# Patient Record
Sex: Male | Born: 1965
Health system: Southern US, Community
[De-identification: ages and names within clinical notes are randomized; demographics above are authoritative.]

## PROBLEM LIST (undated history)

## (undated) DIAGNOSIS — E119 Type 2 diabetes mellitus without complications: Secondary | ICD-10-CM

## (undated) DIAGNOSIS — E079 Disorder of thyroid, unspecified: Secondary | ICD-10-CM

## (undated) DIAGNOSIS — K219 Gastro-esophageal reflux disease without esophagitis: Secondary | ICD-10-CM

## (undated) DIAGNOSIS — E039 Hypothyroidism, unspecified: Secondary | ICD-10-CM

## (undated) DIAGNOSIS — I10 Essential (primary) hypertension: Secondary | ICD-10-CM

## (undated) DIAGNOSIS — R0600 Dyspnea, unspecified: Secondary | ICD-10-CM

## (undated) DIAGNOSIS — G4730 Sleep apnea, unspecified: Secondary | ICD-10-CM

## (undated) HISTORY — DX: Disorder of thyroid, unspecified: E07.9

## (undated) HISTORY — DX: Essential (primary) hypertension: I10

## (undated) HISTORY — DX: Type 2 diabetes mellitus without complications: E11.9

---

## 2009-05-20 ENCOUNTER — Emergency Department (HOSPITAL_COMMUNITY): Admission: EM | Admit: 2009-05-20 | Discharge: 2009-05-20 | Payer: Self-pay | Admitting: Family Medicine

## 2009-08-22 ENCOUNTER — Emergency Department: Payer: Self-pay | Admitting: Internal Medicine

## 2009-08-31 ENCOUNTER — Emergency Department (HOSPITAL_COMMUNITY): Admission: EM | Admit: 2009-08-31 | Discharge: 2009-08-31 | Payer: Self-pay | Admitting: Emergency Medicine

## 2010-12-31 LAB — POCT URINALYSIS DIP (DEVICE)
Bilirubin Urine: NEGATIVE
Glucose, UA: 500 mg/dL — AB
Nitrite: NEGATIVE
Urobilinogen, UA: 1 mg/dL (ref 0.0–1.0)
pH: 7 (ref 5.0–8.0)

## 2011-01-01 IMAGING — CR DG CHEST 2V
1 series · 3 of 3 positions shown · non-contrast
Comparison: none

REASON FOR EXAM: chest pain
COMMENTS:

PROCEDURE:     DXR - DXR CHEST PA (OR AP) AND LATERAL  - August 22, 2009 [DATE]
RESULT:     The lung fields are clear. No pneumonia, pneumothorax or pleural
effusion is seen. Heart size is normal.

[Series 1: view not recorded · 0.17mm/px · 3 of 3 slices shown]
[im 1/3]
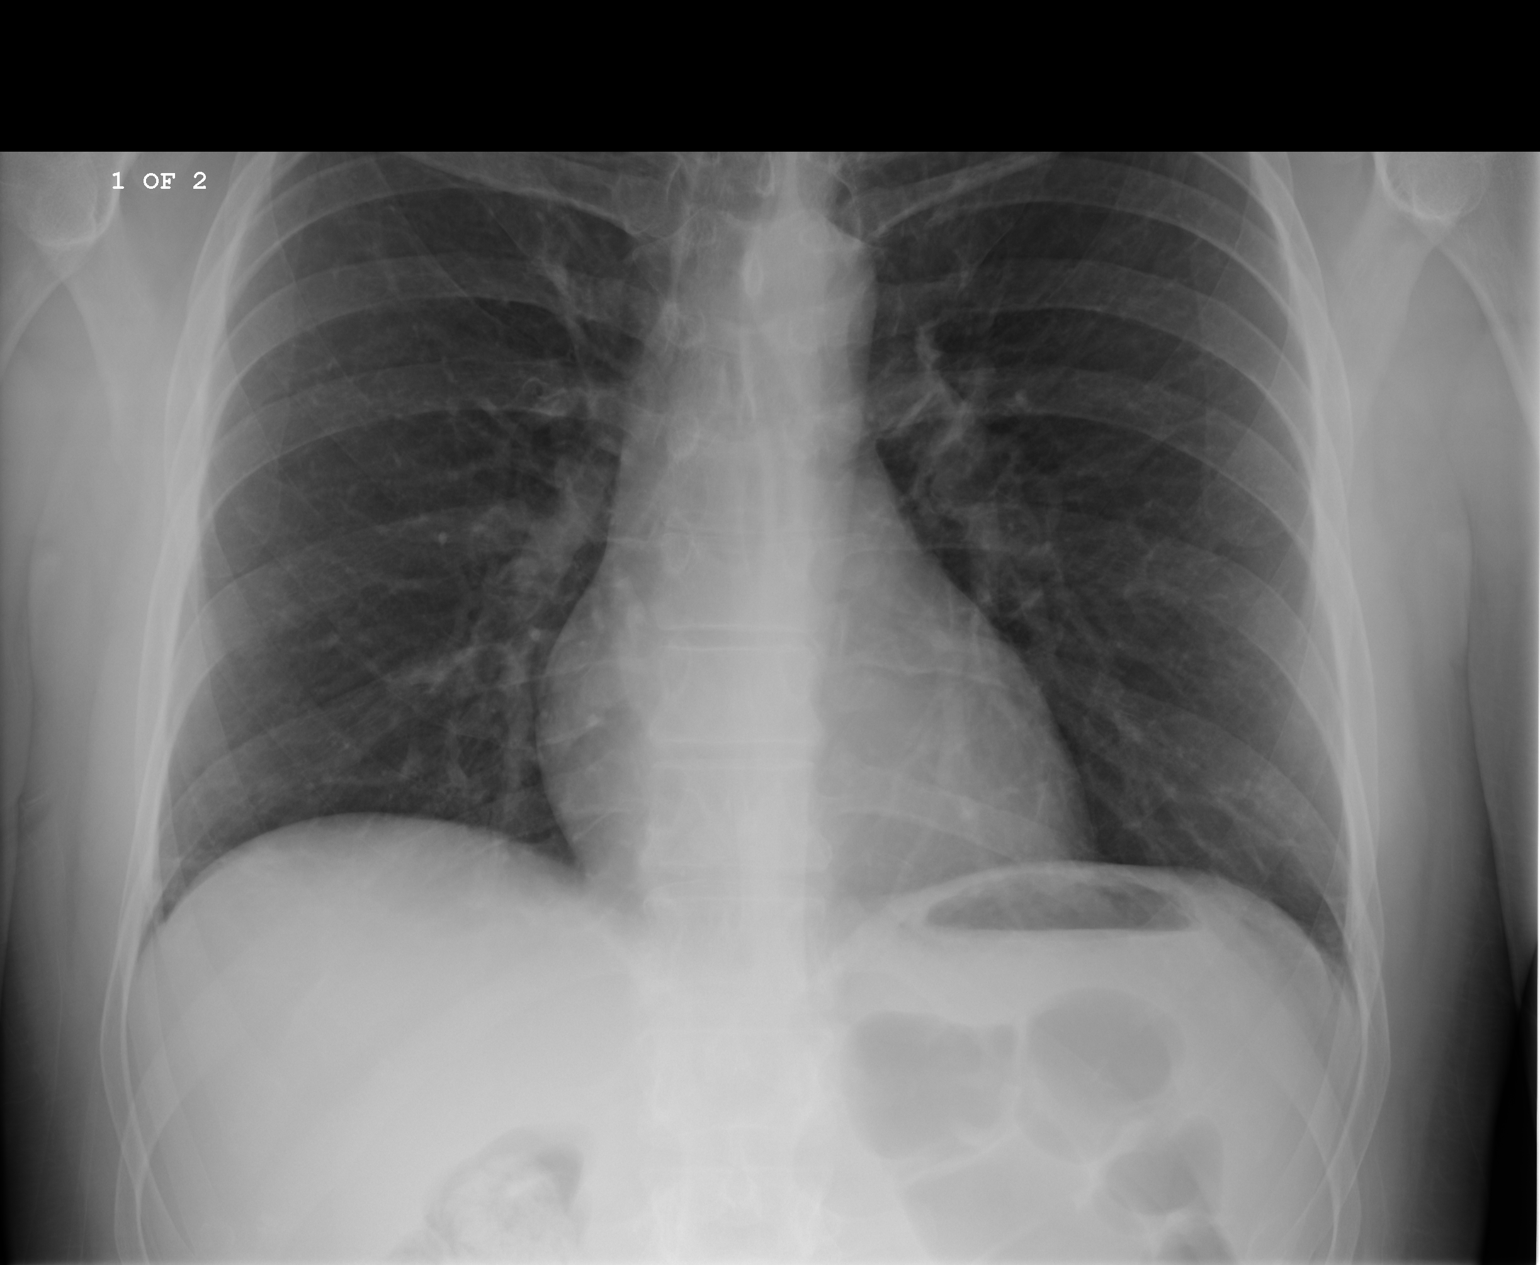
[im 2/3]
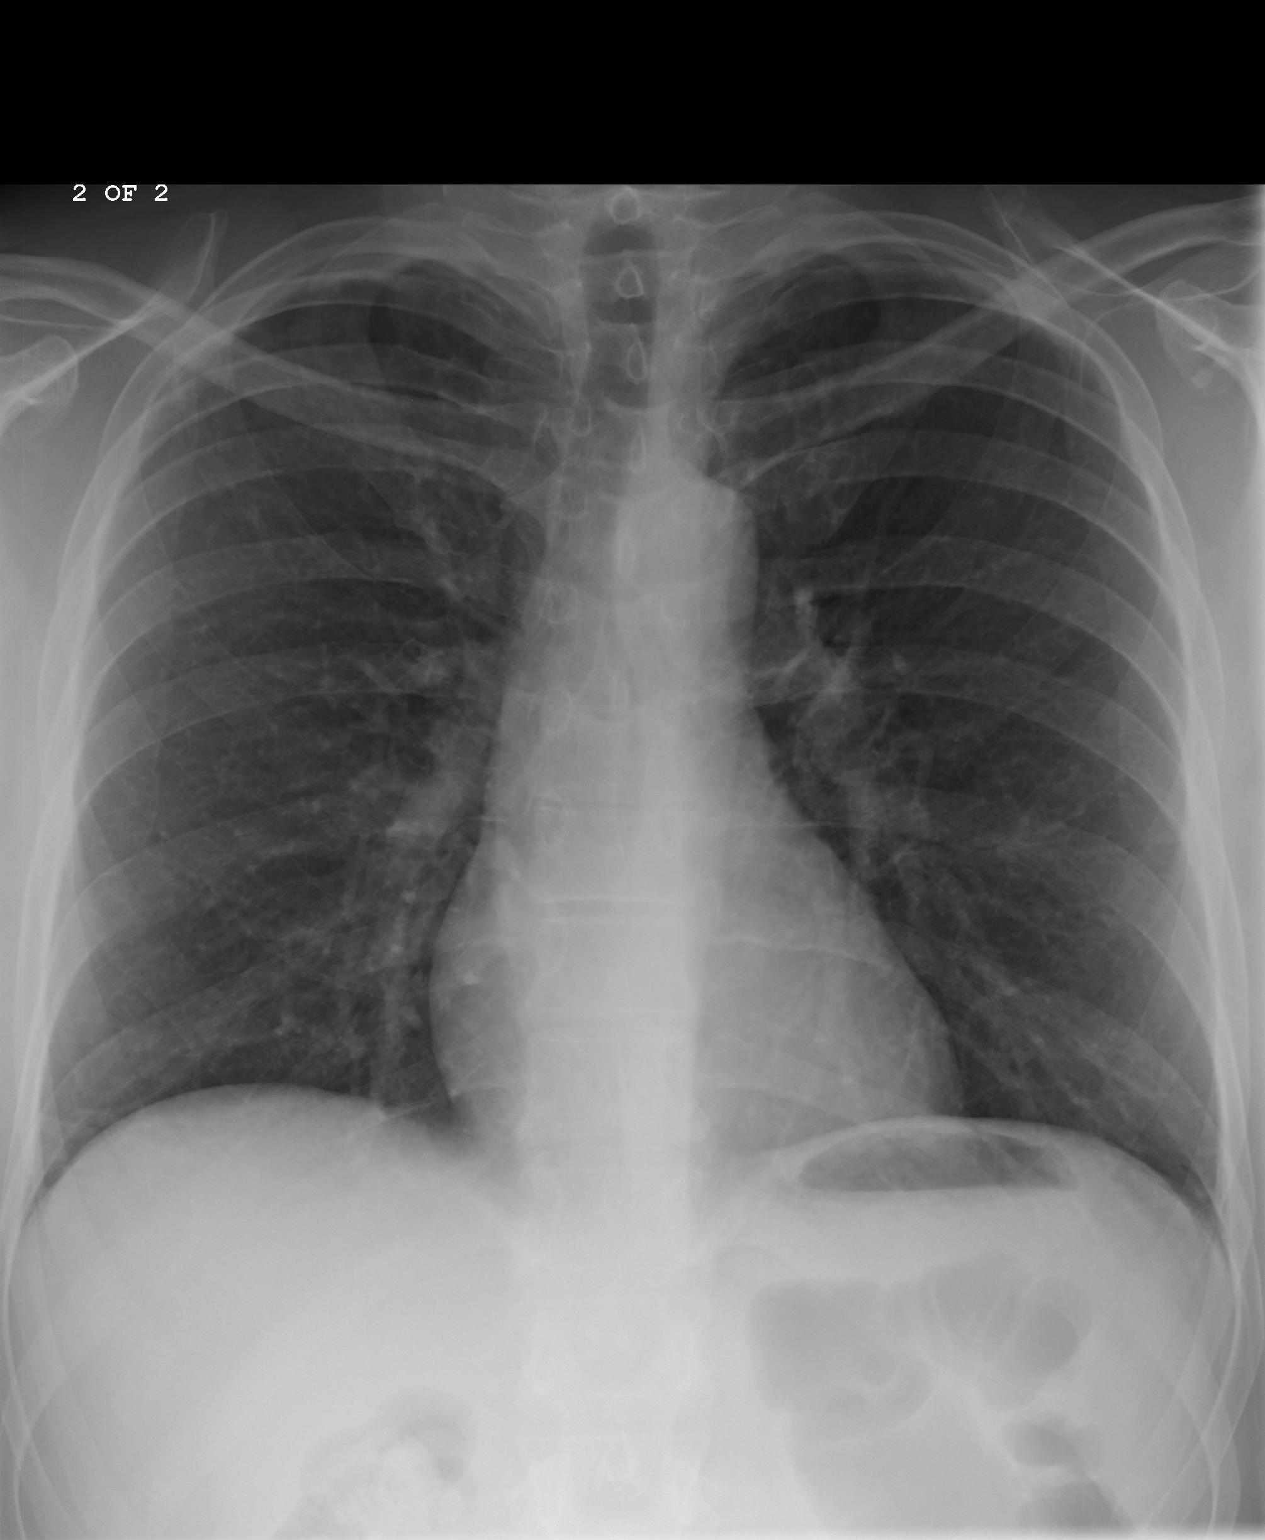
[im 3/3]
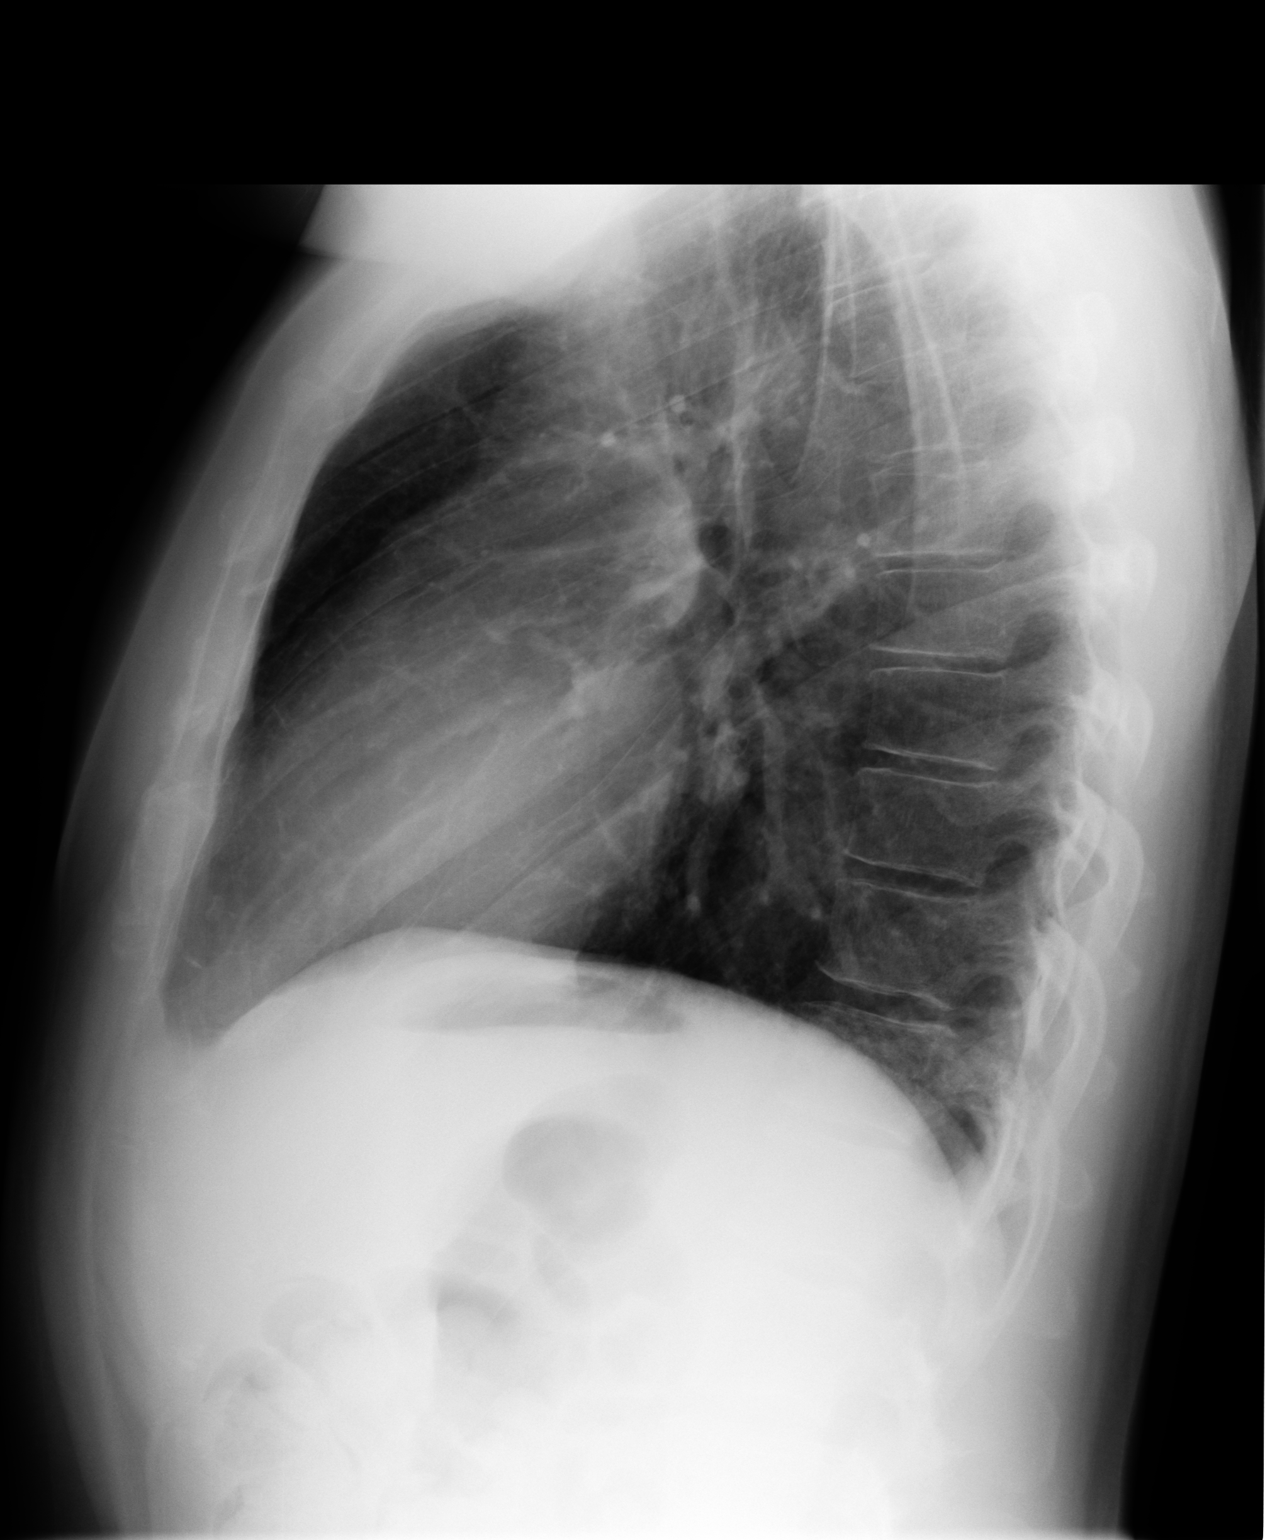

[3 of 3 positions shown; findings below may reference images not displayed]

IMPRESSION: 1.     No acute changes are identified.

## 2011-01-01 IMAGING — CT CT STONE STUDY
1 of 2 series · 15 of 32 positions shown, 19 images · non-contrast
Comparison: none

REASON FOR EXAM: l flank pain
COMMENTS:

[Series 2: stone · axial · 0.69mm/px · z∈[-1216,-772]mm · 15 of 166 slices shown, 19 images]
[im 12/166  soft-tissue]
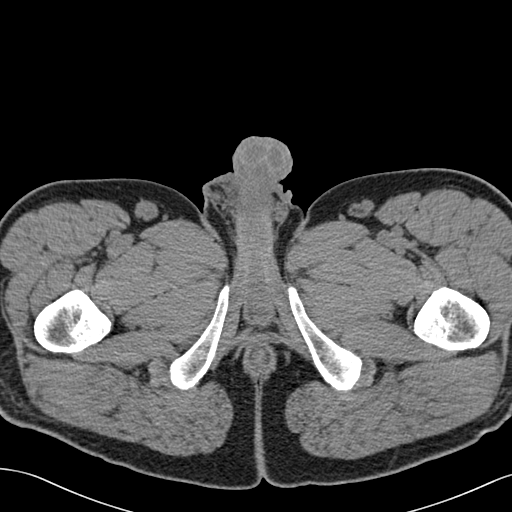
[im 12/166  bone]
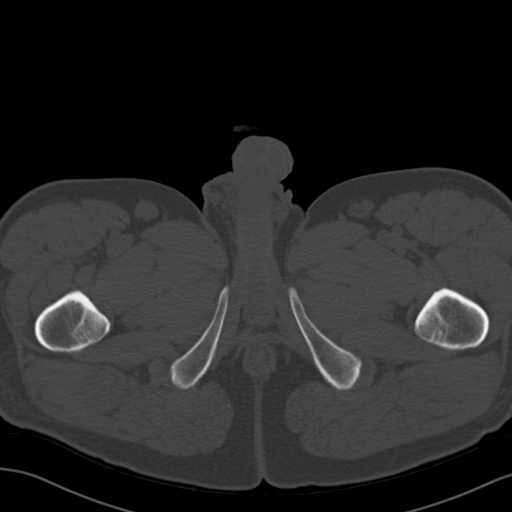
[im 24/166  soft-tissue]
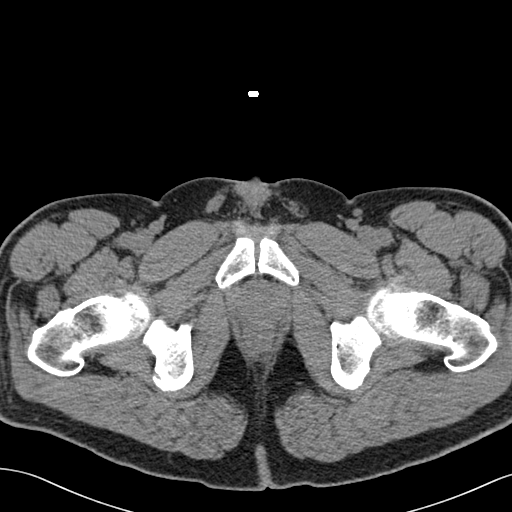
[im 36/166  soft-tissue]
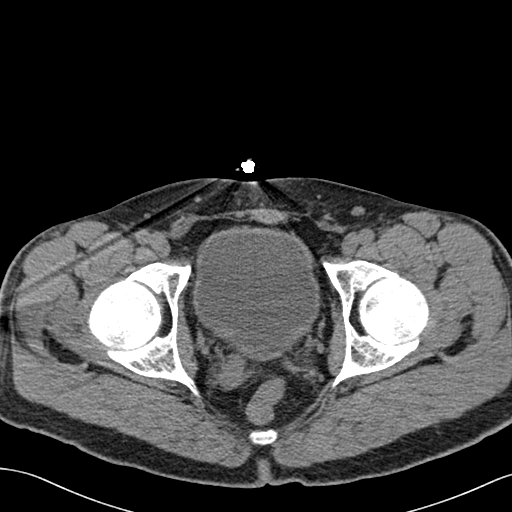
[im 48/166  soft-tissue]
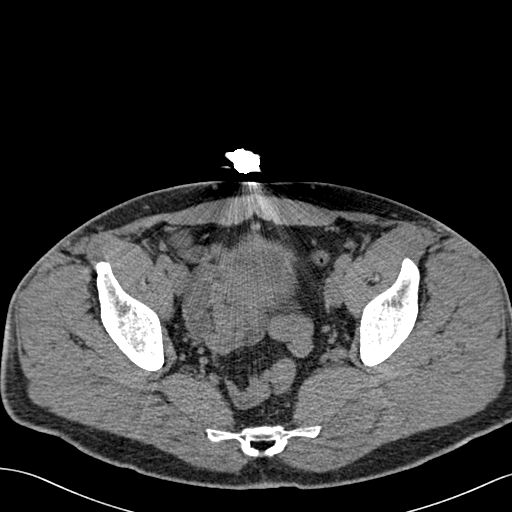
[im 59/166  soft-tissue]
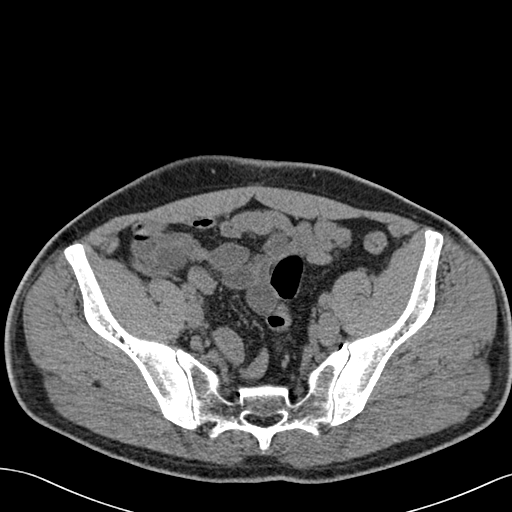
[im 71/166  soft-tissue]
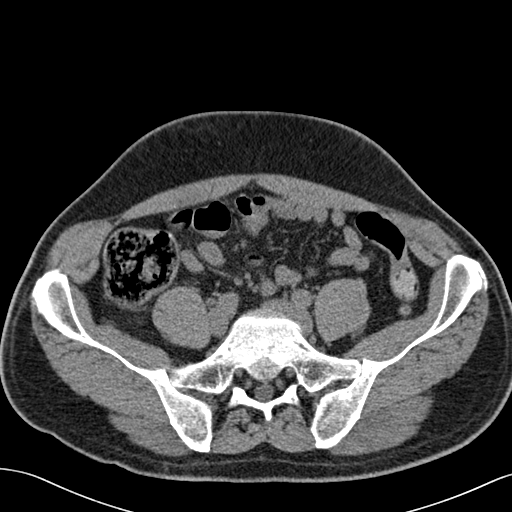
[im 83/166  soft-tissue]
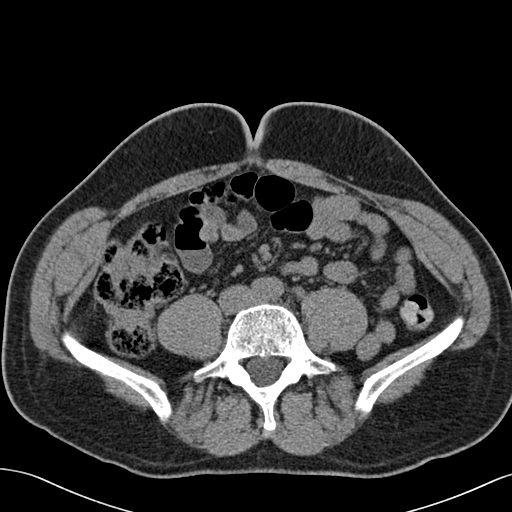
[im 95/166  soft-tissue]
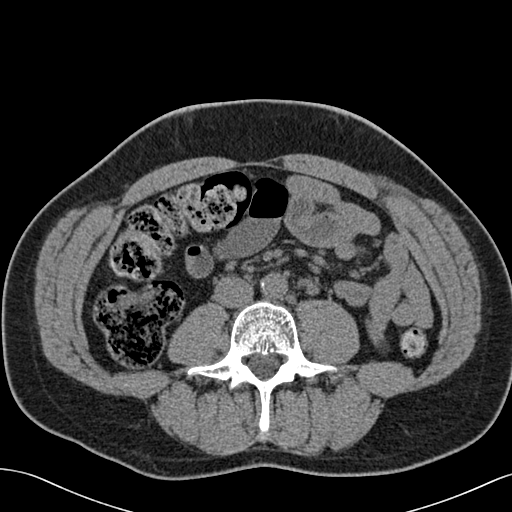
[im 107/166  soft-tissue]
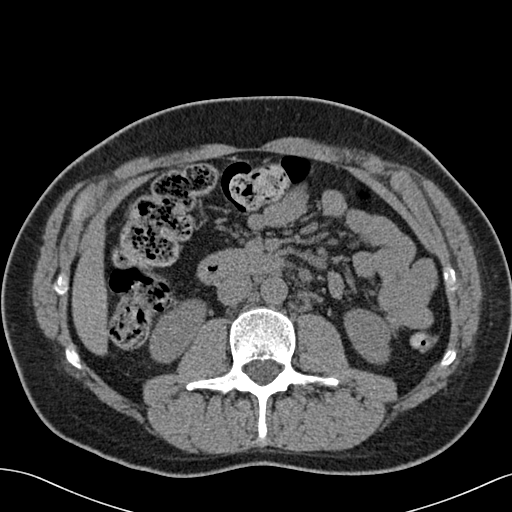
[im 107/166  bone]
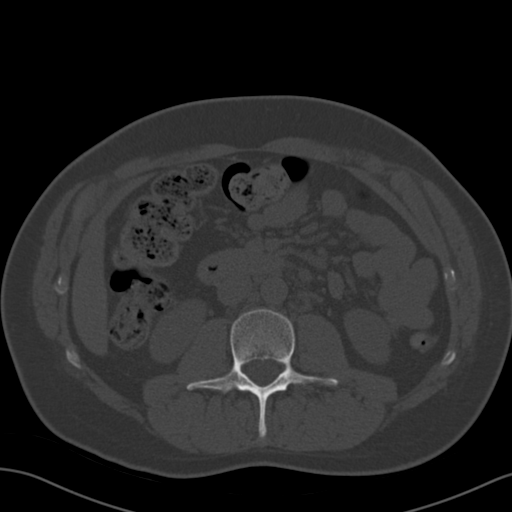
[im 118/166  soft-tissue]
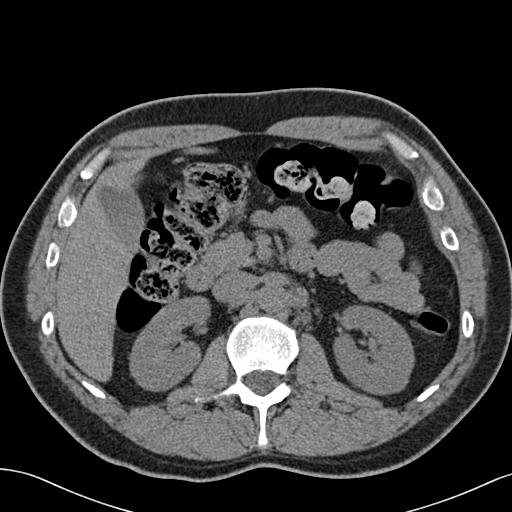
[im 130/166  soft-tissue]
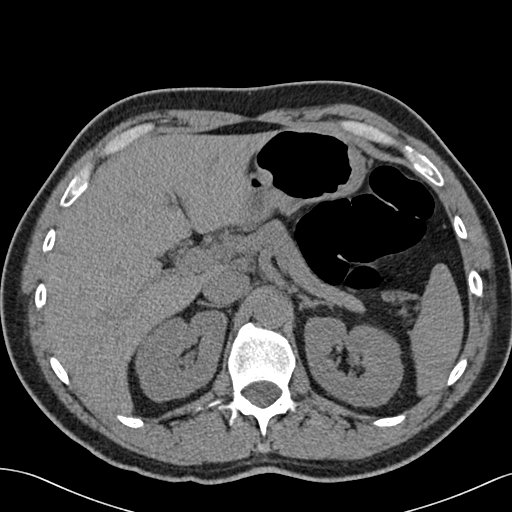
[im 142/166  soft-tissue]
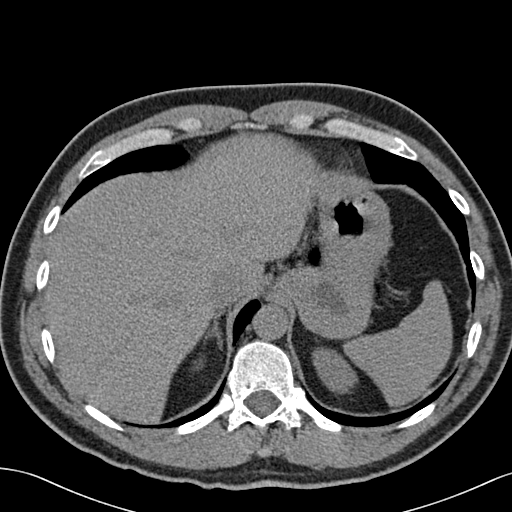
[im 142/166  lung]
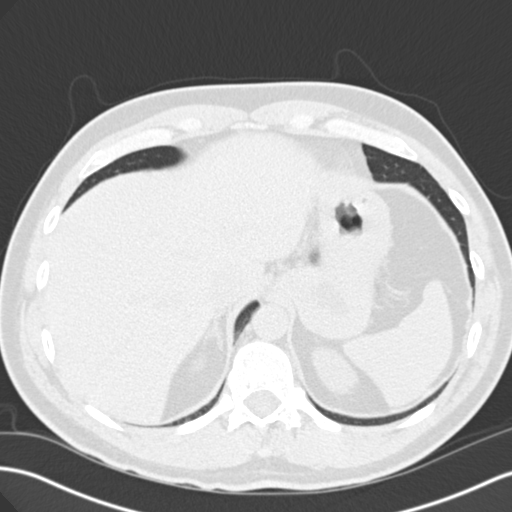
[im 148/166  lung]
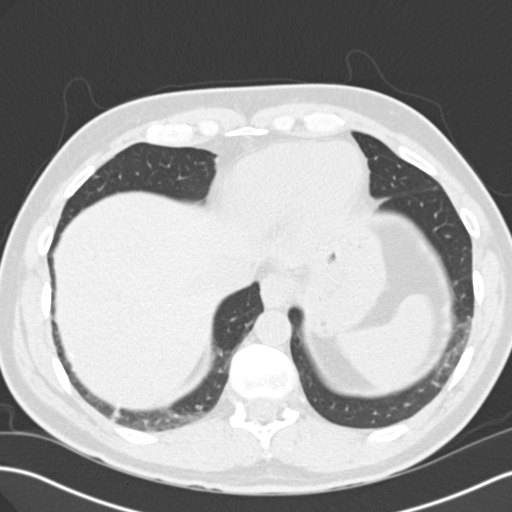
[im 154/166  soft-tissue]
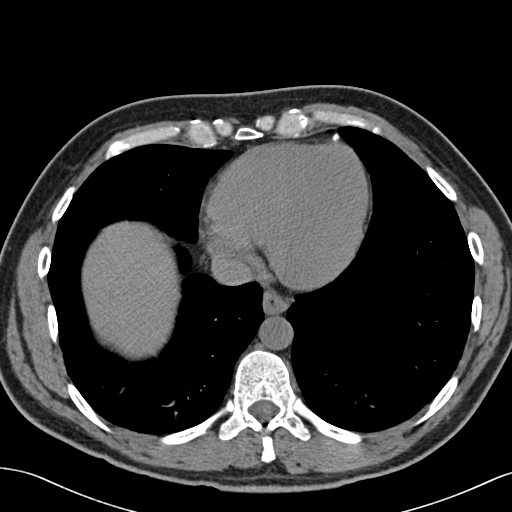
[im 154/166  lung]
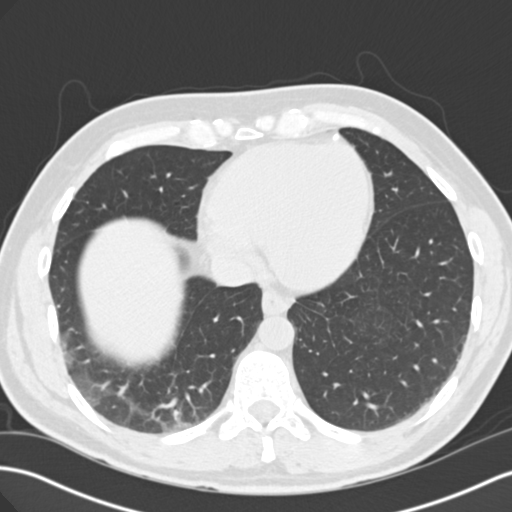
[im 160/166  lung]
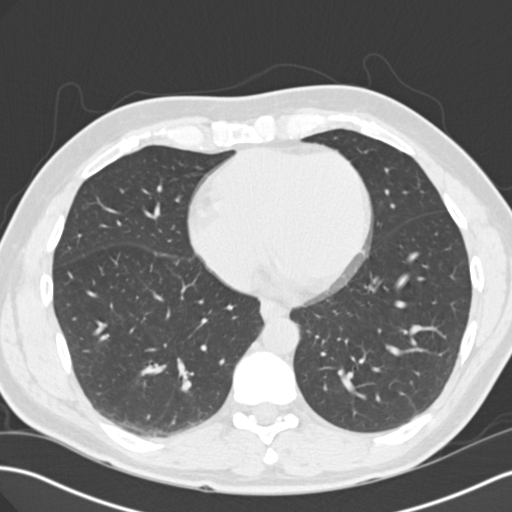

[15 of 32 positions shown; findings below may reference images not displayed]

PROCEDURE:     CT  - CT ABDOMEN /PELVIS WO (STONE)  - August 22, 2009  [DATE]

RESULT:     Noncontrast emergent renal stone protocol CT of the abdomen and
pelvis is reconstructed at 3 mm slice thickness in the axial plane. The
patient has no previous examination for comparison.

Images through the base of the lungs demonstrate minimal dependent
atelectasis there is a noncalcified pulmonary nodule in a subpleural region
laterally at lung window settings on image 6 measuring approximately 5.2 mm.
Followup of this is recommended. There appears to be some atelectasis or
fibrosis in the posterior gutter on the right.

Neither kidney demonstrates obstructive changes or evidence of a renal
stone. The renal cortex shows  a normal appearing contour with no abnormal
attenuation. The gallbladder shows no evidence of radiopaque gallstones. The
appendix is seen and contains a density which could represent
appendicoliths. Ingested medication such as antacid collecting in this
region are also consideration. There is a mild increased density in the
lumen of the descending colon on the left which could be from the same
etiology. The aorta is normal in caliber with some scattered atherosclerotic
calcification. The urinary bladder is unremarkable. The prostate and seminal
vesicles appear to be grossly unremarkable. There is no pneumatosis, free
air or free fluid. The pancreas, spleen and liver as well as the adrenal
glands are unremarkable.
IMPRESSION: Unremarkable CT of the abdomen and pelvis. No urinary
calculi or obstructive changes are evident.
Nodular density laterally in the left lower lobe without calcification. This
is smoothly marginated but should be followed in 6 months. A noncontrast CT
of the chest could be performed.

## 2011-01-10 IMAGING — CR DG RIBS W/ CHEST 3+V*L*
3 series · 3 of 3 positions shown · non-contrast
Comparison: None

CLINICAL DATA: Fall injury with left-sided inferior chest wall
pain, smoker.

LEFT RIBS AND CHEST - 3+ VIEW

[view not recorded (1 of 3)]
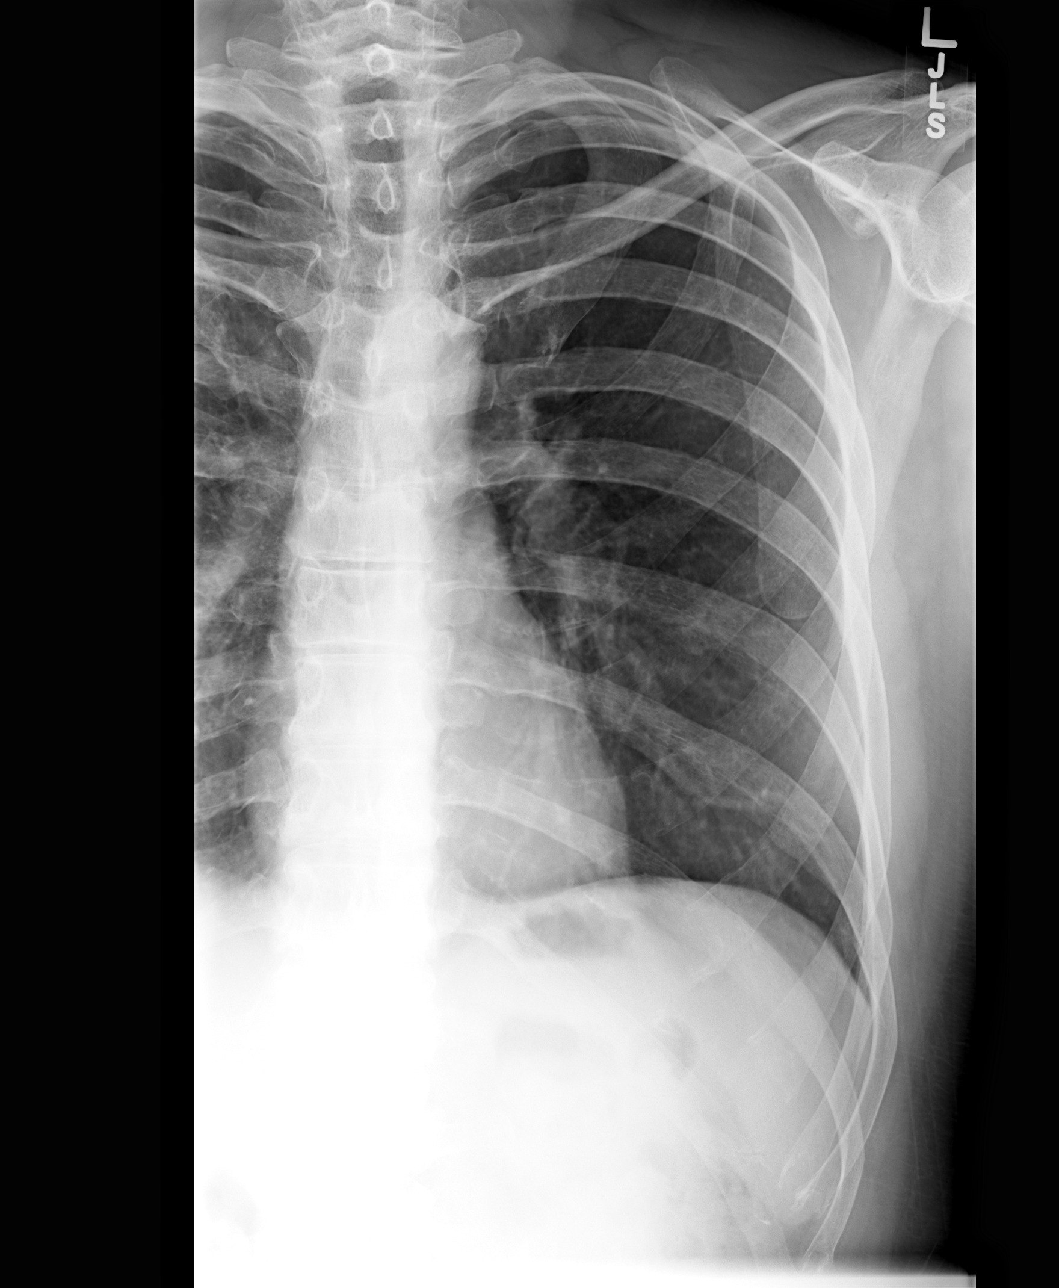

[view not recorded (2 of 3)]
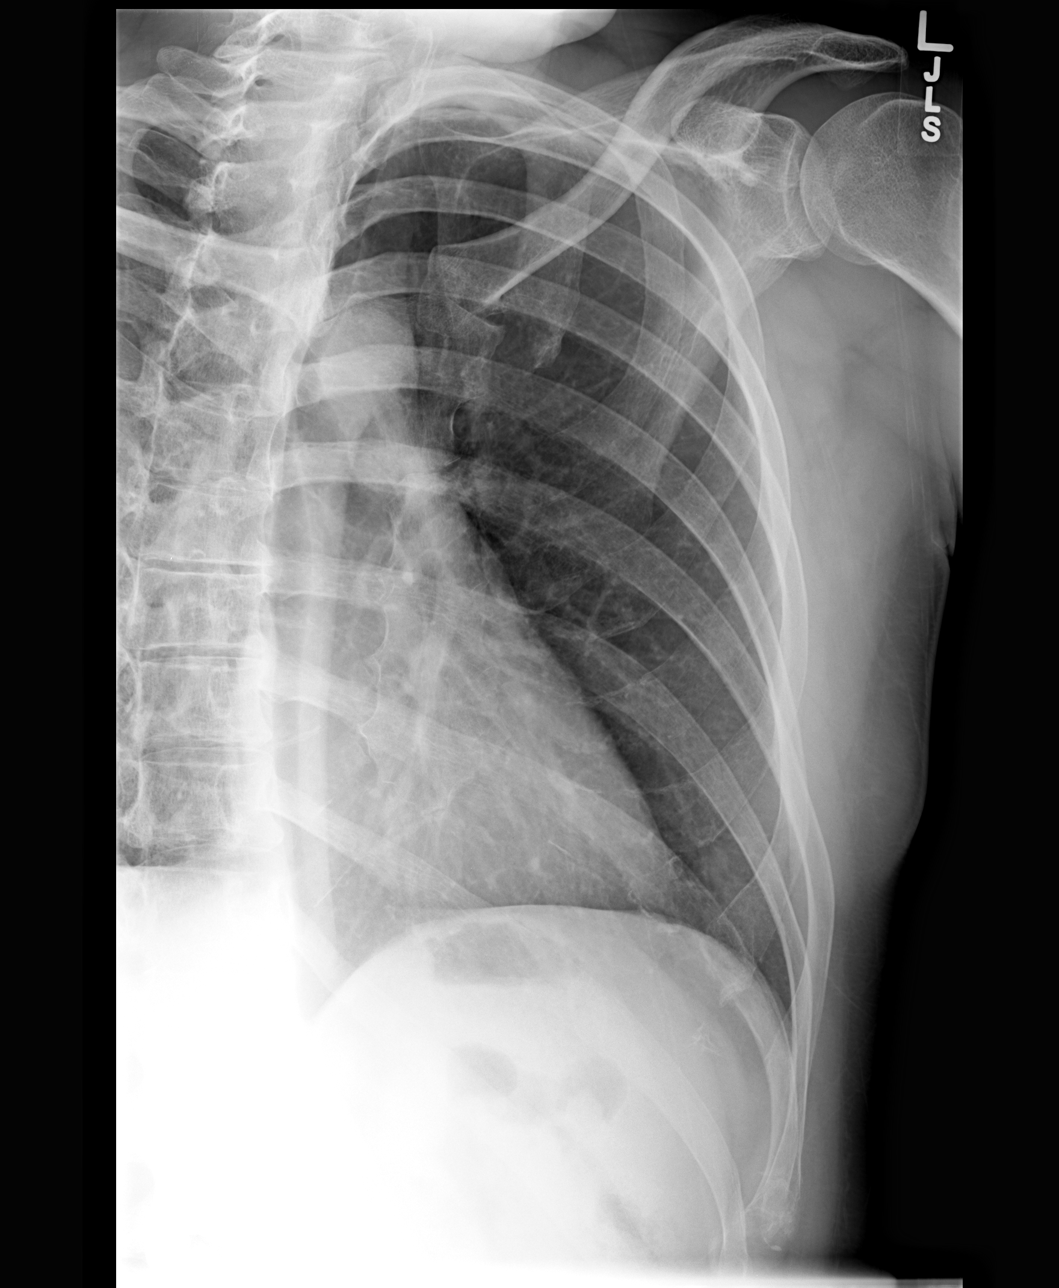

[view not recorded (3 of 3)]
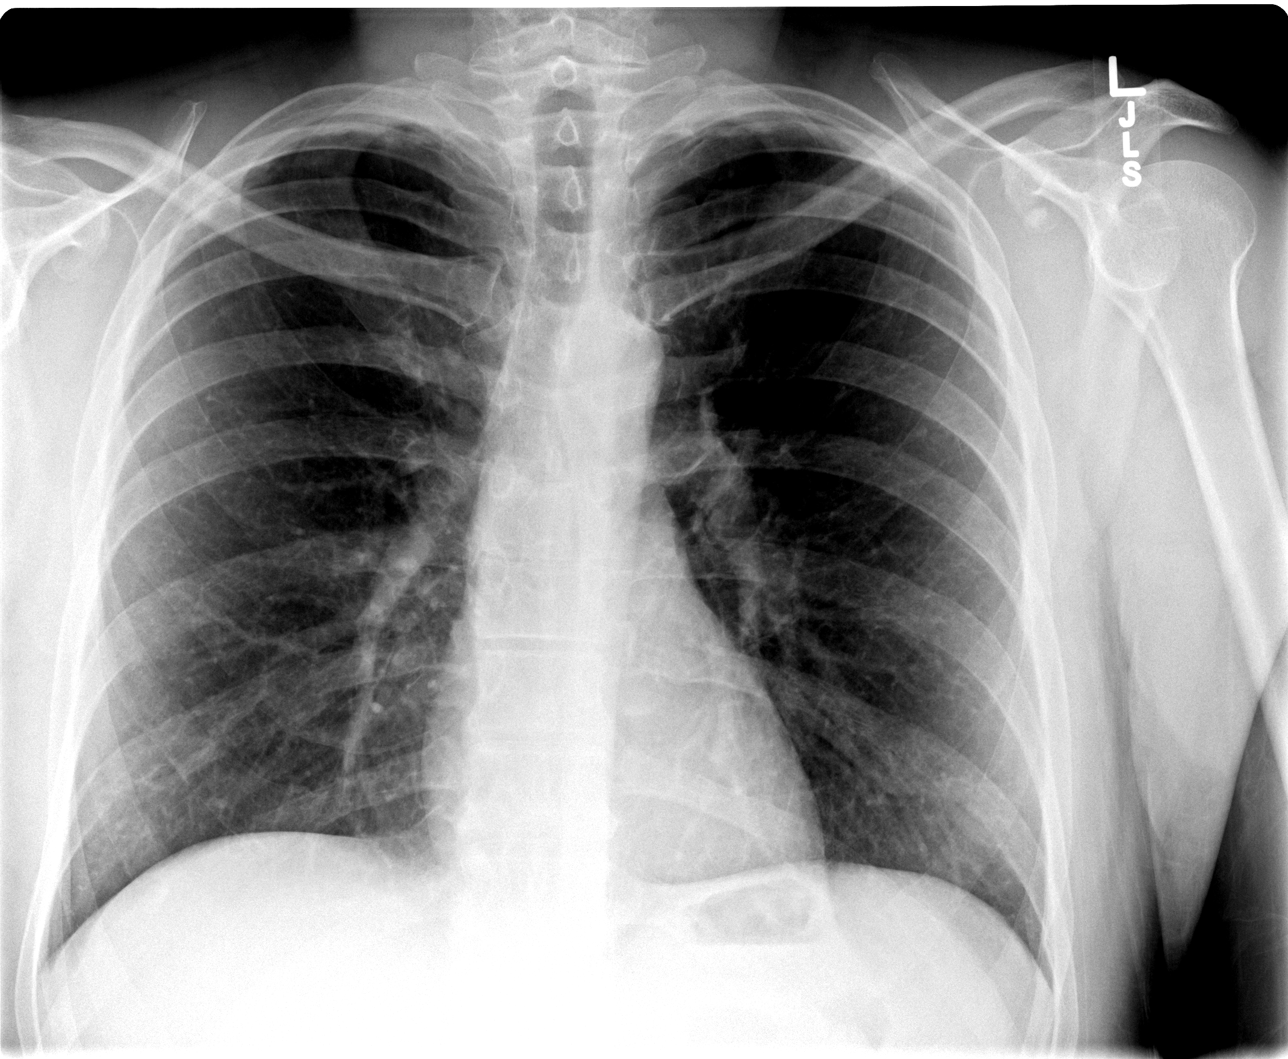

[3 of 3 positions shown; findings below may reference images not displayed]

FINDINGS: Generalized prominence bronchopulmonary markings with
lungs otherwise clear noted.  Heart size and configuration are
normal.  Mediastinum, hila, pleura appear normal.  No rib fracture
or rib lesions visualized.  No pneumothorax or pleural fluid
specifically noted.
IMPRESSION: 1.  Changes of slight chronic asthma or bronchitis.
2.  Otherwise, negative.

## 2012-08-12 ENCOUNTER — Ambulatory Visit: Payer: Self-pay

## 2012-08-20 ENCOUNTER — Ambulatory Visit: Payer: Self-pay

## 2015-07-09 ENCOUNTER — Ambulatory Visit (INDEPENDENT_AMBULATORY_CARE_PROVIDER_SITE_OTHER): Payer: 59 | Admitting: Family Medicine

## 2015-07-09 ENCOUNTER — Encounter: Payer: Self-pay | Admitting: Family Medicine

## 2015-07-09 VITALS — BP 140/84 | HR 68 | Ht 75.0 in | Wt 234.5 lb

## 2015-07-09 DIAGNOSIS — E1065 Type 1 diabetes mellitus with hyperglycemia: Secondary | ICD-10-CM | POA: Insufficient documentation

## 2015-07-09 DIAGNOSIS — E079 Disorder of thyroid, unspecified: Secondary | ICD-10-CM

## 2015-07-09 DIAGNOSIS — Z720 Tobacco use: Secondary | ICD-10-CM

## 2015-07-09 DIAGNOSIS — E109 Type 1 diabetes mellitus without complications: Secondary | ICD-10-CM

## 2015-07-09 DIAGNOSIS — Z7689 Persons encountering health services in other specified circumstances: Secondary | ICD-10-CM

## 2015-07-09 DIAGNOSIS — F17200 Nicotine dependence, unspecified, uncomplicated: Secondary | ICD-10-CM | POA: Insufficient documentation

## 2015-07-09 DIAGNOSIS — E890 Postprocedural hypothyroidism: Secondary | ICD-10-CM | POA: Insufficient documentation

## 2015-07-09 DIAGNOSIS — Z7189 Other specified counseling: Secondary | ICD-10-CM

## 2015-07-09 LAB — POCT URINALYSIS DIPSTICK
BILIRUBIN UA: NEGATIVE
GLUCOSE UA: NEGATIVE
KETONES UA: NEGATIVE
LEUKOCYTES UA: NEGATIVE
Nitrite, UA: NEGATIVE
PROTEIN UA: NEGATIVE
SPEC GRAV UA: 1.015
Urobilinogen, UA: NEGATIVE
pH, UA: 6

## 2015-07-09 LAB — POCT GLYCOSYLATED HEMOGLOBIN (HGB A1C): Hemoglobin A1C: 6.3

## 2015-07-09 MED ORDER — LEVOTHYROXINE SODIUM 200 MCG PO TABS: 200.0000 ug | ORAL_TABLET | Freq: Every day | ORAL | Status: DC

## 2015-07-09 MED ORDER — LOSARTAN POTASSIUM 25 MG PO TABS: 25.0000 mg | ORAL_TABLET | Freq: Every day | ORAL | Status: DC

## 2015-07-09 NOTE — Patient Instructions (Addendum)
CHECK YOUR BLOOD SUGAR FASTING, 2 HOURS AFTER A MEAL AND/OR  BEFORE EXERCISE YOUR A1C TODAY IS 6.3 WHICH IS GOOD.  Steps to Quit Smoking  Smoking tobacco can be harmful to your health and can affect almost every organ in your body. Smoking puts you, and those around you, at risk for developing many serious chronic diseases. Quitting smoking is difficult, but it is one of the best things that you can do for your health. It is never too late to quit. WHAT ARE THE BENEFITS OF QUITTING SMOKING? When you quit smoking, you lower your risk of developing serious diseases and conditions, such as:  Lung cancer or lung disease, such as COPD.  Heart disease.  Stroke.  Heart attack.  Infertility.  Osteoporosis and bone fractures. Additionally, symptoms such as coughing, wheezing, and shortness of breath may get better when you quit. You may also find that you get sick less often because your body is stronger at fighting off colds and infections. If you are pregnant, quitting smoking can help to reduce your chances of having a baby of low birth weight. HOW DO I GET READY TO QUIT? When you decide to quit smoking, create a plan to make sure that you are successful. Before you quit:  Pick a date to quit. Set a date within the next two weeks to give you time to prepare.  Write down the reasons why you are quitting. Keep this list in places where you will see it often, such as on your bathroom mirror or in your car or wallet.  Identify the people, places, things, and activities that make you want to smoke (triggers) and avoid them. Make sure to take these actions:  Throw away all cigarettes at home, at work, and in your car.  Throw away smoking accessories, such as Set designer.  Clean your car and make sure to empty the ashtray.  Clean your home, including curtains and carpets.  Tell your family, friends, and coworkers that you are quitting. Support from your loved ones can make quitting  easier.  Talk with your health care provider about your options for quitting smoking.  Find out what treatment options are covered by your health insurance. WHAT STRATEGIES CAN I USE TO QUIT SMOKING?  Talk with your healthcare provider about different strategies to quit smoking. Some strategies include:  Quitting smoking altogether instead of gradually lessening how much you smoke over a period of time. Research shows that quitting "cold [place]" is more successful than gradually quitting.  Attending in-person counseling to help you build problem-solving skills. You are more likely to have success in quitting if you attend several counseling sessions. Even short sessions of 10 minutes can be effective.  Finding resources and support systems that can help you to quit smoking and remain smoke-free after you quit. These resources are most helpful when you use them often. They can include:  Online chats with a Veterinary surgeon.  Telephone quitlines.  Printed Materials engineer.  Support groups or group counseling.  Text messaging programs.  Mobile phone applications.  Taking medicines to help you quit smoking. (If you are pregnant or breastfeeding, talk with your health care provider first.) Some medicines contain nicotine and some do not. Both types of medicines help with cravings, but the medicines that include nicotine help to relieve withdrawal symptoms. Your health care provider may recommend:  Nicotine patches, gum, or lozenges.  Nicotine inhalers or sprays.  Non-nicotine medicine that is taken by mouth. Talk with your  health care provider about combining strategies, such as taking medicines while you are also receiving in-person counseling. Using these two strategies together makes you more likely to succeed in quitting than if you used either strategy on its own. If you are pregnant or breastfeeding, talk with your health care provider about finding counseling or other support  strategies to quit smoking. Do not take medicine to help you quit smoking unless told to do so by your health care provider. WHAT THINGS CAN I DO TO MAKE IT EASIER TO QUIT? Quitting smoking might feel overwhelming at first, but there is a lot that you can do to make it easier. Take these important actions:  Reach out to your family and friends and ask that they support and encourage you during this time. Call telephone quitlines, reach out to support groups, or work with a counselor for support.  Ask people who smoke to avoid smoking around you.  Avoid places that trigger you to smoke, such as bars, parties, or smoke-break areas at work.  Spend time around people who do not smoke.  Lessen stress in your life, because stress can be a smoking trigger for some people. To lessen stress, try:  Exercising regularly.  Deep-breathing exercises.  Yoga.  Meditating.  Performing a body scan. This involves closing your eyes, scanning your body from head to toe, and noticing which parts of your body are particularly tense. Purposefully relax the muscles in those areas.  Download or purchase mobile phone or tablet apps (applications) that can help you stick to your quit plan by providing reminders, tips, and encouragement. There are many free apps, such as QuitGuide from the [employer] developer for Disease Control and Prevention). You can find other support for quitting smoking (smoking cessation) through smokefree.gov and other websites. HOW WILL I FEEL WHEN I QUIT SMOKING? Within the first 24 hours of quitting smoking, you may start to feel some withdrawal symptoms. These symptoms are usually most noticeable 2-3 days after quitting, but they usually do not last beyond 2-3 weeks. Changes or symptoms that you might experience include:  Mood swings.  Restlessness, anxiety, or irritation.  Difficulty concentrating.  Dizziness.  Strong cravings for sugary foods in addition to nicotine.  Mild weight  gain.  Constipation.  Nausea.  Coughing or a sore throat.  Changes in how your medicines work in your body.  A depressed mood.  Difficulty sleeping (insomnia). After the first 2-3 weeks of quitting, you may start to notice more positive results, such as:  Improved sense of smell and taste.  Decreased coughing and sore throat.  Slower heart rate.  Lower blood pressure.  Clearer skin.  The ability to breathe more easily.  Fewer sick days. Quitting smoking is very challenging for most people. Do not get discouraged if you are not successful the first time. Some people need to make many attempts to quit before they achieve long-term success. Do your best to stick to your quit plan, and talk with your health care provider if you have any questions or concerns.   This information is not intended to replace advice given to you by your health care provider. Make sure you discuss any questions you have with your health care provider.   Document Released: 09/09/2001 Document Revised: 01/30/2015 Document Reviewed: 01/30/2015 Elsevier Interactive Patient Education 2016 Elsevier Inc.     Type 1 Diabetes Mellitus, Adult Type 1 diabetes mellitus, often simply referred to as diabetes, is a long-term (chronic) disease. It occurs when the  islet cells in the pancreas that make insulin (a hormone) are destroyed and can no longer make insulin. Insulin is needed to move sugars from food into the tissue cells. The tissue cells use the sugars for energy. In people with type 1 diabetes, the sugars build up in the blood instead of going into the tissue cells. As a result, high blood sugar (hyperglycemia) develops. Without insulin, the body breaks down fat cells for the needed energy. This breakdown of fat cells produces acid chemicals (ketones), which increases the acid levels in the body. The effect of either high ketone or high sugar (glucose) levels can be life-threatening.  Type 1 diabetes was  also previously called juvenile diabetes. It most often occurs before the age of 5, but it can occur at any age. RISK FACTORS A person is predisposed to developing type 1 diabetes if someone in his or her family has the disease and is exposed to certain additional environmental triggers.  SYMPTOMS  Symptoms of type 1 diabetes may develop gradually over days to weeks or suddenly. The symptoms occur due to hyperglycemia. The symptoms can include:   Increased thirst (polydipsia).  Increased urination (polyuria).  Increased urination during the night (nocturia).  Weight loss. This weight loss may be rapid.  Frequent, recurring infections.  Tiredness (fatigue).  Weakness.  Vision changes, such as blurred vision.  Fruity smell to your breath.  Abdominal pain.  Nausea or vomiting.  An open skin wound (ulcer). DIAGNOSIS  Type 1 diabetes is diagnosed when symptoms of diabetes are present and when blood glucose levels are increased. Your blood glucose level may be checked by one or more of the following blood tests:  A fasting blood glucose test. You will not be allowed to eat for at least 8 hours before a blood sample is taken.  A random blood glucose test. Your blood glucose is checked at any time of the day regardless of when you ate.  A hemoglobin A1c blood glucose test. A hemoglobin A1c test provides information about blood glucose control over the previous 3 months. TREATMENT  Although type 1 diabetes cannot be prevented, it can be managed with insulin, diet, and exercise.  You will need to take insulin daily to keep blood glucose in the desired range.  You will need to match insulin dosing with exercise and healthy food choices. Generally, the goal of treatment is to maintain a pre-meal (preprandial) blood glucose level of 80-130 mg/dL. HOME CARE INSTRUCTIONS   Have your hemoglobin A1c level checked twice a year.  Perform daily blood glucose monitoring as directed by  your health care provider.  Monitor urine ketones when you are ill and as directed by your health care provider.  Take your insulin as directed by your health care provider to maintain your blood glucose level in the desired range.  Never run out of insulin. It is needed every day.  Adjust insulin based on your intake of carbohydrates. Carbohydrates can raise blood glucose levels but need to be included in your diet. Carbohydrates provide vitamins, minerals, and fiber, which are an essential part of a healthy diet. Carbohydrates are found in fruits, vegetables, whole grains, dairy products, legumes, and foods containing added sugars.  Eat healthy foods. Alternate 3 meals with 3 snacks.  Maintain a healthy weight.  Carry a medical alert card or wear your medical alert jewelry.  Carry a 15-gram carbohydrate snack with you at all times to treat low blood glucose (hypoglycemia). Some examples of 15-gram carbohydrate  snacks include:  Glucose tablets, 3 or 4.  Glucose gel, 15-gram tube.  Raisins, 2 tablespoons (24 grams).  Jelly beans, 6.  Animal crackers, 8.  Fruit juice, regular soda, or low-fat milk, 4 ounces (120 mL).  Gummy treats, 9.  Recognize hypoglycemia. Hypoglycemia occurs with blood glucose levels of 70 mg/dL and below. The risk for hypoglycemia increases when fasting or skipping meals, during or after intense exercise, and during sleep. Hypoglycemia symptoms can include:  Tremors or shakes.  Decreased ability to concentrate.  Sweating.  Increased heart rate.  Headache.  Dry mouth.  Hunger.  Irritability.  Anxiety.  Restless sleep.  Altered speech or coordination.  Confusion.  Treat hypoglycemia promptly. If you are alert and able to safely swallow, follow the 15:15 rule:  Take 15-20 grams of rapid-acting glucose or carbohydrate. Rapid-acting options include glucose gel, glucose tablets, or 4 ounces (120 mL) of fruit juice, regular soda, or low-fat  milk.  Check your blood glucose level 15 minutes after taking the glucose.  Take 15-20 grams more of glucose if the repeat blood glucose level is still 70 mg/dL or below.  Eat a meal or snack within 1 hour once blood glucose levels return to normal.  Be alert to polyuria and polydipsia, which are early signs of hyperglycemia. An early awareness of hyperglycemia allows for prompt treatment. Treat hyperglycemia as directed by your health care provider.  Exercise regularly as directed by your health care provider. This includes:  Stretching and performing strength training exercises, such as yoga or weight lifting, at least 2 times per week.  Performing a total of at least 150 minutes of moderate-intensity exercise each week, such as brisk walking or water aerobics.  Exercising at least 3 days per week, making sure you allow no more than 2 consecutive days to pass without exercising.  Avoiding long periods of inactivity (90 minutes or more). When you have to spend an extended period of time sitting down, take frequent breaks to walk or stretch.  Adjust your insulin dosing and food intake as needed if you start a new exercise or sport.  Follow your sick-day plan at any time you are unable to eat or drink as usual.   Do not use any tobacco products including cigarettes, chewing tobacco, or electronic cigarettes. If you need help quitting, ask your health care provider.  Limit alcohol intake to no more than 1 drink per day for nonpregnant women and 2 drinks per day for men. You should drink alcohol only when you are also eating food. Talk with your health care provider about whether alcohol is safe for you. Tell your health care provider if you drink alcohol several times a week.  Keep all follow-up visits as directed by your health care provider.  Schedule an eye exam within 5 years of diagnosis and then annually.  Perform daily skin and foot care. Examine your skin and feet daily for  cuts, bruises, redness, nail problems, bleeding, blisters, or sores. A foot exam should be done by a health care provider 5 years after diagnosis, and then every year after the first exam.  Brush your teeth and gums at least twice a day and floss at least once a day. Follow up with your dentist regularly.  Share your diabetes management plan with your workplace or school.  Keep your immunizations up to date. It is recommended that you receive a flu (influenza) vaccine every year. It is also recommended that you receive a pneumonia (pneumococcal) vaccine. If  you are 32 years of age or older and have never received a pneumonia vaccine, this vaccine may be given as a series of two separate shots. Ask your health care provider which additional vaccines may be recommended.  Learn to manage stress.  Obtain ongoing diabetes education and support as needed.  Participate in or seek rehabilitation as needed to maintain or improve independence and quality of life. Request a physical or occupational therapy referral if you are having foot or hand numbness, or difficulties with grooming, dressing, eating, or physical activity. SEEK MEDICAL CARE IF:   You are unable to eat food or drink fluids for more than 6 hours.  You have nausea and vomiting for more than 6 hours.  Your blood glucose level is over 240 mg/dL.  There is a change in mental status.  You develop an additional serious illness.  You have diarrhea for more than 6 hours.  You have been sick or have had a fever for a couple of days and are not getting better.  You have pain during any physical activity. SEEK IMMEDIATE MEDICAL CARE IF:  You have difficulty breathing.  You have moderate to large ketone levels. MAKE SURE YOU:  Understand these instructions.  Will watch your condition.  Will get help right away if you are not doing well or get worse.   This information is not intended to replace advice given to you by your health  care provider. Make sure you discuss any questions you have with your health care provider.   Document Released: 09/12/2000 Document Revised: 06/06/2015 Document Reviewed: 04/13/2012 Elsevier Interactive Patient Education [employer]

## 2015-07-09 NOTE — Progress Notes (Signed)
Subjective:    Patient ID: Willie Spencer, male    DOB: September 25, 1966, 49 y.o.   MRN: [id number]  HPI He is new to the practice and here to establish primary care. He has not seen a primary care provider in several years. He has been seeing Dr. Tedd Sias endocrrinologist and he states he was dismissed from their office 5 months ago for not showing up to his appointments.  States he was in a year long study at [facility] for DM and stopped in May, states Dr. Tedd Sias was running the study.  Diagnosed with Type 1 DM at age 83. He is currently taking lantus 40 units after supper and humolog 10-15 units 3 times daily with meals, he has been on this regimen for over a year.  Does not check his blood sugar regularly. He reports having a low in early September and wife had to call 911, she states he was unresponsive and blood sugar was 21. He states he can usually tell when his sugar is getting real high or dropping but he had no warning that day.  Meals: Regular meals, does not carb count Feet: check daily - no problems Exercise- has not been doing anything for exercise but has plans to start exercising with wife.   Denies fever, chills, unexplained weight loss, fatigue, headache, dizziness, chest pain, palpitations, shortness of breath, GI or GU issues. Denies numbness, tingling, weakness or foot issues.    Diagnosed with hyperthyroid and had radiation treatment. Now taking levothyroxine.  He is taking Losartan for hand cramping and states he does not have high blood pressure.  Had injection in right shoulder about a year ago at ortho office but he did not follow up.    He states he gets flu and pneumonia shot at work.   Eye exam- has prescription glasses [facility]. Has  Not followed up.  Colonoscopy- never   Married and has 5 children.  Works as Armed forces training and education officer Smoker since age 69 1 ppd. He states he would consider quitting and has only tried using e-cigs in order to cut back. No other  methods tried. Alcohol use occasionally Denies drug use.    Reviewed allergies, medications, past medical, surgical and social history.   Review of Systems Pertinent positives and negatives in the history of present illness.    Objective:   Physical Exam  Constitutional: He is oriented to person, place, and time. He appears well-developed and well-nourished. No distress.  Cardiovascular: Normal rate, regular rhythm and normal heart sounds.   Pulmonary/Chest: Effort normal and breath sounds normal.  Neurological: He is alert and oriented to person, place, and time.  Skin: Skin is warm and dry.  Psychiatric: He has a normal mood and affect. His behavior is normal. Judgment and thought content normal.    Urinalysis dipstick negative Hgb A1C 6.3      Assessment & Plan:  Type 1 diabetes mellitus without complication (HCC) - Plan: POCT urinalysis dipstick, POCT glycosylated hemoglobin (Hb A1C)  Thyroid disease  Smoker  Encounter to establish care  Discussed that his Hgb A1C is within goal and no change recommended to his medication regimen.  Discussed that he should start checking his blood sugar on a regular basis, fasting, 2 hours post prandial and before exercise since he is planning on starting an exercise routine.  Also recommend that he wear a bracelet that will alert others that he has DM in case of another low blood glucose like the one in  September. He appears to have a fair amount of knowledge regarding his diabetes however he has not been keeping an eye on his blood sugars. Discussed that a new exercise regimen and weight loss will most likely affect the amount of insulin his body requires and that he should start slow and build up in regards to time and activity level.   No records to review. He is out of oral medications, refilled these. Discussed smoking cessation and he has not tried any methods other than switching to E-cigs. He is in the contemplation stage and we  will address this more at next visit. Discussed with patient and his wife that he appears to have a history of not following up with his providers and that it is in his best interest to follow-up with providers in order to properly manage his chronic conditions. He will need fasting labs and a complete physical exam once I receive his medical records.  Recommend eye exam.  Spent at least 45 minutes and 50 percent of this was in counseling and coordination of care.

## 2015-07-24 ENCOUNTER — Ambulatory Visit: Payer: Self-pay | Admitting: Family Medicine

## 2015-07-26 ENCOUNTER — Encounter: Payer: Self-pay | Admitting: Family Medicine

## 2015-07-26 ENCOUNTER — Telehealth: Payer: Self-pay | Admitting: Family Medicine

## 2015-07-26 DIAGNOSIS — M25511 Pain in right shoulder: Secondary | ICD-10-CM

## 2015-07-26 DIAGNOSIS — G8929 Other chronic pain: Secondary | ICD-10-CM | POA: Insufficient documentation

## 2015-07-26 NOTE — Telephone Encounter (Signed)
Called and left message to call back to try and change appt to a fasting CPE per provider

## 2015-07-30 ENCOUNTER — Encounter: Payer: Self-pay | Admitting: Family Medicine

## 2015-07-30 ENCOUNTER — Ambulatory Visit (INDEPENDENT_AMBULATORY_CARE_PROVIDER_SITE_OTHER): Payer: 59 | Admitting: Family Medicine

## 2015-07-30 VITALS — BP 136/82 | HR 60 | Ht 75.0 in | Wt 235.6 lb

## 2015-07-30 DIAGNOSIS — Z0000 Encounter for general adult medical examination without abnormal findings: Secondary | ICD-10-CM | POA: Diagnosis not present

## 2015-07-30 DIAGNOSIS — E109 Type 1 diabetes mellitus without complications: Secondary | ICD-10-CM

## 2015-07-30 DIAGNOSIS — F17200 Nicotine dependence, unspecified, uncomplicated: Secondary | ICD-10-CM

## 2015-07-30 DIAGNOSIS — E079 Disorder of thyroid, unspecified: Secondary | ICD-10-CM | POA: Diagnosis not present

## 2015-07-30 DIAGNOSIS — Z720 Tobacco use: Secondary | ICD-10-CM | POA: Diagnosis not present

## 2015-07-30 LAB — CBC WITH DIFFERENTIAL/PLATELET
BASOS PCT: 1 % (ref 0–1)
Basophils Absolute: 0.1 10*3/uL (ref 0.0–0.1)
Eosinophils Absolute: 0.3 10*3/uL (ref 0.0–0.7)
Eosinophils Relative: 4 % (ref 0–5)
HCT: 39.8 % (ref 39.0–52.0)
HEMOGLOBIN: 13.5 g/dL (ref 13.0–17.0)
Lymphocytes Relative: 23 % (ref 12–46)
Lymphs Abs: 1.7 10*3/uL (ref 0.7–4.0)
MCH: 30.8 pg (ref 26.0–34.0)
MCHC: 33.9 g/dL (ref 30.0–36.0)
MCV: 90.7 fL (ref 78.0–100.0)
MONOS PCT: 12 % (ref 3–12)
MPV: 10.6 fL (ref 8.6–12.4)
Monocytes Absolute: 0.9 10*3/uL (ref 0.1–1.0)
NEUTROS ABS: 4.4 10*3/uL (ref 1.7–7.7)
NEUTROS PCT: 60 % (ref 43–77)
Platelets: 269 10*3/uL (ref 150–400)
RBC: 4.39 MIL/uL (ref 4.22–5.81)
RDW: 12.5 % (ref 11.5–15.5)
WBC: 7.4 10*3/uL (ref 4.0–10.5)

## 2015-07-30 LAB — LIPID PANEL
CHOL/HDL RATIO: 4.5 ratio (ref <=5.0)
CHOLESTEROL: 165 mg/dL (ref 125–200)
HDL: 37 mg/dL — ABNORMAL LOW (ref >=40)
LDL Cholesterol: 104 mg/dL (ref <130)
TRIGLYCERIDES: 120 mg/dL (ref <150)
VLDL: 24 mg/dL (ref <30)

## 2015-07-30 LAB — COMPREHENSIVE METABOLIC PANEL
ALBUMIN: 4.2 g/dL (ref 3.6–5.1)
ALT: 16 U/L (ref 9–46)
AST: 19 U/L (ref 10–40)
Alkaline Phosphatase: 75 U/L (ref 40–115)
BILIRUBIN TOTAL: 0.7 mg/dL (ref 0.2–1.2)
BUN: 12 mg/dL (ref 7–25)
CO2: 30 mmol/L (ref 20–31)
Calcium: 8.7 mg/dL (ref 8.6–10.3)
Chloride: 103 mmol/L (ref 98–110)
Creat: 0.84 mg/dL (ref 0.60–1.35)
Glucose, Bld: 131 mg/dL — ABNORMAL HIGH (ref 65–99)
Potassium: 4.5 mmol/L (ref 3.5–5.3)
SODIUM: 138 mmol/L (ref 135–146)
TOTAL PROTEIN: 6.5 g/dL (ref 6.1–8.1)

## 2015-07-30 LAB — POCT URINALYSIS DIPSTICK
Bilirubin, UA: NEGATIVE
GLUCOSE UA: NEGATIVE
KETONES UA: NEGATIVE
LEUKOCYTES UA: NEGATIVE
Nitrite, UA: NEGATIVE
PROTEIN UA: NEGATIVE
Spec Grav, UA: >=1.03
Urobilinogen, UA: NEGATIVE
pH, UA: 6

## 2015-07-30 LAB — TSH: TSH: 1.697 u[IU]/mL (ref 0.350–4.500)

## 2015-07-30 MED ORDER — INSULIN LISPRO 100 UNIT/ML (KWIKPEN): PEN_INJECTOR | SUBCUTANEOUS | Status: DC

## 2015-07-30 MED ORDER — INSULIN GLARGINE 100 UNIT/ML SOLOSTAR PEN: 40.0000 [IU] | PEN_INJECTOR | Freq: Every day | SUBCUTANEOUS | Status: DC

## 2015-07-30 NOTE — Progress Notes (Signed)
Subjective:    Patient ID: Willie Spencer, male    DOB: 1966-05-01, 49 y.o.   MRN: [id number]  HPI He is here today for a complete physical examination.   Diabetes: Blood sugars- today fasting 119. Has had other blood sugars in the 80s, one 340 in evening, and 284; not checking them often enough to see a pattern.  Medication compliance: Humalog 10 units every morning, 10-15 units at lunch, 10 units at supper. Lantus 40 units, reports good compliance Diet: breakfast 2 sausage biscuit and pack crackers, and fast food for lunch. Supper - reports a meat and 1 starch and 1 vegetable. Every night has 3 oreos before bed.  Exercise: not really exercising. He and wife started walking but not routinely.  Issues/concerns: none  Smoking: 1 pack per day, also using e-cigs. Wants to quit but not ready to set quit date. States his wife also smokes and this makes it harder to stop. Drinking alcohol: socially. Weekends mainly, reports 12 beers this past weekend Drugs: denies  Eye exam: 2 years ago, overdue. [place]  Foot exam: checks daily, no problems. Dentist: 1 year ago.   Immunizations:  Hep B- 8 years ago per patient Tdap- 8 Years ago per patient Flu shot last week Pneumonia shot last year.   Reviewed allergies, medications, past medical, surgical and social history.   Review of Systems Review of Systems Constitutional: -fever, -chills, -sweats, -unexpected weight change,-fatigue ENT: -runny nose, -ear pain, -sore throat Cardiology:  -chest pain, -palpitations, -edema Respiratory: -cough, -shortness of breath, -wheezing Gastroenterology: -abdominal pain, -nausea, -vomiting, -diarrhea, -constipation  Hematology: -bleeding or bruising problems Musculoskeletal: -arthralgias, -myalgias, -joint swelling, -back pain Ophthalmology: -vision changes Urology: -dysuria, -difficulty urinating, -hematuria, -urinary frequency, -urgency Neurology: -headache, -weakness, -tingling, -numbness      Objective:   Physical Exam  BP 136/82 mmHg  Pulse 60  Ht  (1.905 m)  Wt 235 lb 9.6 oz (106.867 kg)  BMI 29.45 kg/m2  General Appearance:    Alert, cooperative, no distress, appears stated age  Head:    Normocephalic, without obvious abnormality, atraumatic  Eyes:    PERRL, conjunctiva/corneas clear, EOM's intact, fundi    benign  Ears:    Normal TM's and external ear canals  Nose:   Nares normal, mucosa normal, no drainage or sinus   tenderness  Throat:   Lips, mucosa, and tongue normal; teeth and gums normal  Neck:   Supple, no lymphadenopathy;  thyroid:  no   enlargement/tenderness/nodules; no carotid   bruit   Back:    Spine nontender, no curvature, ROM normal, no CVA     tenderness  Lungs:     Clear to auscultation bilaterally without wheezes, rales or     ronchi; respirations unlabored  Chest Wall:    No tenderness or deformity   Heart:    Regular rate and rhythm, S1 and S2 normal, no murmur, rub   or gallop  Breast Exam:    No chest wall tenderness, masses or gynecomastia  Abdomen:     Soft, non-tender, nondistended, normoactive bowel sounds,    no masses, no hepatosplenomegaly  Genitalia:    Refused.  Rectal:    Normal sphincter tone, no masses or tenderness; guaiac negative stool.  Prostate smooth, no nodules, not enlarged.  Extremities:   No clubbing, cyanosis or edema  Pulses:   2+ and symmetric all extremities  Skin:   Skin color, texture, turgor normal, no rashes or lesions  Lymph nodes:  Cervical, supraclavicular, and axillary nodes normal  Neurologic:   CNII-XII intact, normal strength, sensation and gait; reflexes 2+ and symmetric throughout          Psych:   Normal mood, affect, hygiene and grooming.    Urinalysis dipstick: trace of blood, Microscopic urine: fewer than 3 RBCs per field, some bacteria     Assessment & Plan:  Routine general medical examination at a health care facility - Plan: POCT urinalysis dipstick, Visual acuity screening, CBC with  Differential/Platelet, Comprehensive metabolic panel, Lipid panel, Microalbumin / creatinine urine ratio  Thyroid disease - Plan: TSH  Type 1 diabetes mellitus without complication (HCC) - Plan: CBC with Differential/Platelet, Comprehensive metabolic panel, Microalbumin / creatinine urine ratio  Smoker   Discussed that he should start checking his blood sugars more often, in the morning when he is fasting and 2 hours after a meal. He states he is ok doing this. I recommend that he set an alarm or reminder if needed to help him remember. No changes to insulin regimen at this time. Recommend that he go for diabetes and nutrition counseling since his diet consists of a high amount of fast food and and carbs, he does not want to do this. He will let me know if he changes his mind. Also recommended that he start getting at least 150 minutes of physical activity per week. I also discussed that importance of all of these lifestyle modifications in front of his wife. He stated that he would like for both of them to start eating healthier and exercising and cut back on smoking. I discussed that he should set a good example and take the initiative to make positive changes.  UA dipstick positive for trace of blood. Negative microscopic, less than 3 RBCs per field. Will follow up on this at next visit.  Discussed in depth smoking cessation and methods to successfully quit smoking. Information provided to patient. He states he would like to quit but has not made an effort to do this and is not willing to set a quit date at this time.  Refills sent to pharmacy for Humalog and Lantus. He will let me know if he is having any low blood sugars or having multiple highs.  Discussed the diabetes spectrum and the importance of taking good care of himself now. Recommend eye exam, dental exam and continue to check his feet.  No changes made to thyroid medication at this time, pending TSH.  He will return for Diabetes check  in 2-3 months and will do a Hgb A1C then. Will discuss smoking cessation again.

## 2015-07-30 NOTE — Patient Instructions (Addendum)
Check your blood sugars more often. In the morning fasting then once 2 hours after a meal. Look into getting a med alert bracelet. Let me know if you want to be referred to a Nutritionist.  Schedule a dentist appointment and eye exam.  Steps to Quit Smoking  Smoking tobacco can be harmful to your health and can affect almost every organ in your body. Smoking puts you, and those around you, at risk for developing many serious chronic diseases. Quitting smoking is difficult, but it is one of the best things that you can do for your health. It is never too late to quit. WHAT ARE THE BENEFITS OF QUITTING SMOKING? When you quit smoking, you lower your risk of developing serious diseases and conditions, such as:  Lung cancer or lung disease, such as COPD.  Heart disease.  Stroke.  Heart attack.  Infertility.  Osteoporosis and bone fractures. Additionally, symptoms such as coughing, wheezing, and shortness of breath may get better when you quit. You may also find that you get sick less often because your body is stronger at fighting off colds and infections. If you are pregnant, quitting smoking can help to reduce your chances of having a baby of low birth weight. HOW DO I GET READY TO QUIT? When you decide to quit smoking, create a plan to make sure that you are successful. Before you quit:  Pick a date to quit. Set a date within the next two weeks to give you time to prepare.  Write down the reasons why you are quitting. Keep this list in places where you will see it often, such as on your bathroom mirror or in your car or wallet.  Identify the people, places, things, and activities that make you want to smoke (triggers) and avoid them. Make sure to take these actions:  Throw away all cigarettes at home, at work, and in your car.  Throw away smoking accessories, such as Set designer.  Clean your car and make sure to empty the ashtray.  Clean your home, including curtains and  carpets.  Tell your family, friends, and coworkers that you are quitting. Support from your loved ones can make quitting easier.  Talk with your health care provider about your options for quitting smoking.  Find out what treatment options are covered by your health insurance. WHAT STRATEGIES CAN I USE TO QUIT SMOKING?  Talk with your healthcare provider about different strategies to quit smoking. Some strategies include:  Quitting smoking altogether instead of gradually lessening how much you smoke over a period of time. Research shows that quitting "cold [place]" is more successful than gradually quitting.  Attending in-person counseling to help you build problem-solving skills. You are more likely to have success in quitting if you attend several counseling sessions. Even short sessions of 10 minutes can be effective.  Finding resources and support systems that can help you to quit smoking and remain smoke-free after you quit. These resources are most helpful when you use them often. They can include:  Online chats with a Veterinary surgeon.  Telephone quitlines.  Printed Materials engineer.  Support groups or group counseling.  Text messaging programs.  Mobile phone applications.  Taking medicines to help you quit smoking. (If you are pregnant or breastfeeding, talk with your health care provider first.) Some medicines contain nicotine and some do not. Both types of medicines help with cravings, but the medicines that include nicotine help to relieve withdrawal symptoms. Your health care provider may recommend:  Nicotine patches, gum, or lozenges.  Nicotine inhalers or sprays.  Non-nicotine medicine that is taken by mouth. Talk with your health care provider about combining strategies, such as taking medicines while you are also receiving in-person counseling. Using these two strategies together makes you more likely to succeed in quitting than if you used either strategy on its  own. If you are pregnant or breastfeeding, talk with your health care provider about finding counseling or other support strategies to quit smoking. Do not take medicine to help you quit smoking unless told to do so by your health care provider. WHAT THINGS CAN I DO TO MAKE IT EASIER TO QUIT? Quitting smoking might feel overwhelming at first, but there is a lot that you can do to make it easier. Take these important actions:  Reach out to your family and friends and ask that they support and encourage you during this time. Call telephone quitlines, reach out to support groups, or work with a counselor for support.  Ask people who smoke to avoid smoking around you.  Avoid places that trigger you to smoke, such as bars, parties, or smoke-break areas at work.  Spend time around people who do not smoke.  Lessen stress in your life, because stress can be a smoking trigger for some people. To lessen stress, try:  Exercising regularly.  Deep-breathing exercises.  Yoga.  Meditating.  Performing a body scan. This involves closing your eyes, scanning your body from head to toe, and noticing which parts of your body are particularly tense. Purposefully relax the muscles in those areas.  Download or purchase mobile phone or tablet apps (applications) that can help you stick to your quit plan by providing reminders, tips, and encouragement. There are many free apps, such as QuitGuide from the [employer] developer(Centers for Disease Control and Prevention). You can find other support for quitting smoking (smoking cessation) through smokefree.gov and other websites. HOW WILL I FEEL WHEN I QUIT SMOKING? Within the first 24 hours of quitting smoking, you may start to feel some withdrawal symptoms. These symptoms are usually most noticeable 2-3 days after quitting, but they usually do not last beyond 2-3 weeks. Changes or symptoms that you might experience include:  Mood swings.  Restlessness, anxiety, or  irritation.  Difficulty concentrating.  Dizziness.  Strong cravings for sugary foods in addition to nicotine.  Mild weight gain.  Constipation.  Nausea.  Coughing or a sore throat.  Changes in how your medicines work in your body.  A depressed mood.  Difficulty sleeping (insomnia). After the first 2-3 weeks of quitting, you may start to notice more positive results, such as:  Improved sense of smell and taste.  Decreased coughing and sore throat.  Slower heart rate.  Lower blood pressure.  Clearer skin.  The ability to breathe more easily.  Fewer sick days. Quitting smoking is very challenging for most people. Do not get discouraged if you are not successful the first time. Some people need to make many attempts to quit before they achieve long-term success. Do your best to stick to your quit plan, and talk with your health care provider if you have any questions or concerns.   This information is not intended to replace advice given to you by your health care provider. Make sure you discuss any questions you have with your health care provider.   Document Released: 09/09/2001 Document Revised: 01/30/2015 Document Reviewed: 01/30/2015 Elsevier Interactive Patient Education 2016 [employer]   Preventative Care for Adults, Male  REGULAR HEALTH EXAMS:  A routine yearly physical is a good way to check in with your primary care provider about your health and preventive screening. It is also an opportunity to share updates about your health and any concerns you have, and receive a thorough all-over exam.   Most health insurance companies pay for at least some preventative services.  Check with your health plan for specific coverages.  WHAT PREVENTATIVE SERVICES DO MEN NEED?  Adult men should have their weight and blood pressure checked regularly.   Men age 67 and older should have their cholesterol levels checked regularly.  Beginning at age 30 and  continuing to age 69, men should be screened for colorectal cancer.  Certain people should may need continued testing until age 58.  Other cancer screening may include exams for testicular and prostate cancer.  Updating vaccinations is part of preventative care.  Vaccinations help protect against diseases such as the flu.  Lab tests are generally done as part of preventative care to screen for anemia and blood disorders, to screen for problems with the kidneys and liver, to screen for bladder problems, to check blood sugar, and to check your cholesterol level.  Preventative services generally include counseling about diet, exercise, avoiding tobacco, drugs, excessive alcohol consumption, and sexually transmitted infections.    GENERAL RECOMMENDATIONS FOR GOOD HEALTH:  Healthy diet:  Eat a variety of foods, including fruit, vegetables, animal or vegetable protein, such as meat, fish, chicken, and eggs, or beans, lentils, tofu, and grains, such as rice.  Drink plenty of water daily.  Decrease saturated fat in the diet, avoid lots of red meat, processed foods, sweets, fast foods, and fried foods.  Exercise:  Aerobic exercise helps maintain good heart health. At least 30-40 minutes of moderate-intensity exercise is recommended. For example, a brisk walk that increases your heart rate and breathing. This should be done on most days of the week.   Find a type of exercise or a variety of exercises that you enjoy so that it becomes a part of your daily life.  Examples are running, walking, swimming, water aerobics, and biking.  For motivation and support, explore group exercise such as aerobic class, spin class, Zumba, Yoga,or  martial arts, etc.    Set exercise goals for yourself, such as a certain weight goal, walk or run in a race such as a 5k walk/run.  Speak to your primary care provider about exercise goals.  Disease prevention:  If you smoke or chew tobacco, find out from your caregiver  how to quit. It can literally save your life, no matter how long you have been a tobacco user. If you do not use tobacco, never begin.   Maintain a healthy diet and normal weight. Increased weight leads to problems with blood pressure and diabetes.   The Body Mass Index or BMI is a way of measuring how much of your body is fat. Having a BMI above 27 increases the risk of heart disease, diabetes, hypertension, stroke and other problems related to obesity. Your caregiver can help determine your BMI and based on it develop an exercise and dietary program to help you achieve or maintain this important measurement at a healthful level.  High blood pressure causes heart and blood vessel problems.  Persistent high blood pressure should be treated with medicine if weight loss and exercise do not work.   Fat and cholesterol leaves deposits in your arteries that can block them. This causes heart disease and vessel  disease elsewhere in your body.  If your cholesterol is found to be high, or if you have heart disease or certain other medical conditions, then you may need to have your cholesterol monitored frequently and be treated with medication.   Ask if you should have a stress test if your history suggests this. A stress test is a test done on a treadmill that looks for heart disease. This test can find disease prior to there being a problem.  Avoid drinking alcohol in excess (more than two drinks per day).  Avoid use of street drugs. Do not share needles with anyone. Ask for professional help if you need assistance or instructions on stopping the use of alcohol, cigarettes, and/or drugs.  Brush your teeth twice a day with fluoride toothpaste, and floss once a day. Good oral hygiene prevents tooth decay and gum disease. The problems can be painful, unattractive, and can cause other health problems. Visit your dentist for a routine oral and dental check up and preventive care every 6-12 months.   Look at your  skin regularly.  Use a mirror to look at your back. Notify your caregivers of changes in moles, especially if there are changes in shapes, colors, a size larger than a pencil eraser, an irregular border, or development of new moles.  Safety:  Use seatbelts 100% of the time, whether driving or as a passenger.  Use safety devices such as hearing protection if you work in environments with loud noise or significant background noise.  Use safety glasses when doing any work that could send debris in to the eyes.  Use a helmet if you ride a bike or motorcycle.  Use appropriate safety gear for contact sports.  Talk to your caregiver about gun safety.  Use sunscreen with a SPF (or skin protection factor) of 15 or greater.  Lighter skinned people are at a greater risk of skin cancer. Don't forget to also wear sunglasses in order to protect your eyes from too much damaging sunlight. Damaging sunlight can accelerate cataract formation.   Practice safe sex. Use condoms. Condoms are used for birth control and to help reduce the spread of sexually transmitted infections (or STIs).  Some of the STIs are gonorrhea (the clap), chlamydia, syphilis, trichomonas, herpes, HPV (human papilloma virus) and HIV (human immunodeficiency virus) which causes AIDS. The herpes, HIV and HPV are viral illnesses that have no cure. These can result in disability, cancer and death.   Keep carbon monoxide and smoke detectors in your home functioning at all times. Change the batteries every 6 months or use a model that plugs into the wall.   Vaccinations:  Stay up to date with your tetanus shots and other required immunizations. You should have a booster for tetanus every 10 years. Be sure to get your flu shot every year, since 5%-20% of the [place] population comes down with the flu. The flu vaccine changes each year, so being vaccinated once is not enough. Get your shot in the fall, before the flu season peaks.   Other vaccines to  consider:  Pneumococcal vaccine to protect against certain types of pneumonia.  This is normally recommended for adults age 2 or older.  However, adults younger than 47 years old with certain underlying conditions such as diabetes, heart or lung disease should also receive the vaccine.  Shingles vaccine to protect against Varicella Zoster if you are older than age 81, or younger than 47 years old with certain underlying illness.  Hepatitis  A vaccine to protect against a form of infection of the liver by a virus acquired from food.  Hepatitis B vaccine to protect against a form of infection of the liver by a virus acquired from blood or body fluids, particularly if you work in health care.  If you plan to travel internationally, check with your local health department for specific vaccination recommendations.  Cancer Screening:  Most routine colon cancer screening begins at the age of 59. On a yearly basis, doctors may provide special easy to use take-home tests to check for hidden blood in the stool. Sigmoidoscopy or colonoscopy can detect the earliest forms of colon cancer and is life saving. These tests use a small camera at the end of a tube to directly examine the colon. Speak to your caregiver about this at age 79, when routine screening begins (and is repeated every 5 years unless early forms of pre-cancerous polyps or small growths are found).   At the age of 73 men usually start screening for prostate cancer every year. Screening may begin at a younger age for those with higher risk. Those at higher risk include African-Americans or having a family history of prostate cancer. There are two types of tests for prostate cancer:   Prostate-specific antigen (PSA) testing. Recent studies raise questions about prostate cancer using PSA and you should discuss this with your caregiver.   Digital rectal exam (in which your doctor's lubricated and gloved finger feels for enlargement of the prostate  through the anus).   Screening for testicular cancer.  Do a monthly exam of your testicles. Gently roll each testicle between your thumb and fingers, feeling for any abnormal lumps. The best time to do this is after a hot shower or bath when the tissues are looser. Notify your caregivers of any lumps, tenderness or changes in size or shape immediately.

## 2015-07-31 LAB — MICROALBUMIN / CREATININE URINE RATIO
Creatinine, Urine: 95 mg/dL (ref 20–370)
MICROALB/CREAT RATIO: 4 ug/mg{creat} (ref <30)
Microalb, Ur: 0.4 mg/dL

## 2015-08-01 ENCOUNTER — Encounter: Payer: Self-pay | Admitting: Family Medicine

## 2015-09-11 ENCOUNTER — Other Ambulatory Visit: Payer: Self-pay | Admitting: Family Medicine

## 2015-09-11 NOTE — Telephone Encounter (Signed)
Patient has an appt in january

## 2015-10-08 ENCOUNTER — Ambulatory Visit: Payer: 59 | Admitting: Family Medicine

## 2015-10-11 ENCOUNTER — Ambulatory Visit: Payer: 59 | Admitting: Family Medicine

## 2015-10-14 ENCOUNTER — Other Ambulatory Visit: Payer: Self-pay | Admitting: Family Medicine

## 2015-10-24 ENCOUNTER — Ambulatory Visit (INDEPENDENT_AMBULATORY_CARE_PROVIDER_SITE_OTHER): Payer: 59 | Admitting: Family Medicine

## 2015-10-24 ENCOUNTER — Encounter: Payer: Self-pay | Admitting: Family Medicine

## 2015-10-24 VITALS — BP 126/82 | HR 64 | Ht 75.0 in | Wt 248.0 lb

## 2015-10-24 DIAGNOSIS — E109 Type 1 diabetes mellitus without complications: Secondary | ICD-10-CM

## 2015-10-24 DIAGNOSIS — Z720 Tobacco use: Secondary | ICD-10-CM

## 2015-10-24 DIAGNOSIS — E669 Obesity, unspecified: Secondary | ICD-10-CM | POA: Diagnosis not present

## 2015-10-24 DIAGNOSIS — F17200 Nicotine dependence, unspecified, uncomplicated: Secondary | ICD-10-CM

## 2015-10-24 LAB — POCT GLYCOSYLATED HEMOGLOBIN (HGB A1C): Hemoglobin A1C: 8.1

## 2015-10-24 MED ORDER — INSULIN GLARGINE 100 UNIT/ML SOLOSTAR PEN: 40.0000 [IU] | PEN_INJECTOR | Freq: Every day | SUBCUTANEOUS | Status: DC

## 2015-10-24 NOTE — Patient Instructions (Signed)
Your hemoglobin A1C is 8.1% today. This is up, average daily blood sugar is slightly higher than 180. I recommend working on reducing sugar and carb intake and start walking at least 150 minutes per week.  Follow up in 3 months.  Get your eyes checked.  Bring your meter in next time and check your blood sugar more often. Let me know if you are getting fasting blood sugars higher than 130 and 2 hours after a meal, I would like your blood sugar to be between 130-160.   Basic Carbohydrate Counting for Diabetes Mellitus Carbohydrate counting is a method for keeping track of the amount of carbohydrates you eat. Eating carbohydrates naturally increases the level of sugar (glucose) in your blood, so it is important for you to know the amount that is okay for you to have in every meal. Carbohydrate counting helps keep the level of glucose in your blood within normal limits. The amount of carbohydrates allowed is different for every person. A dietitian can help you calculate the amount that is right for you. Once you know the amount of carbohydrates you can have, you can count the carbohydrates in the foods you want to eat. Carbohydrates are found in the following foods:  Grains, such as breads and cereals.  Dried beans and soy products.  Starchy vegetables, such as potatoes, peas, and corn.  Fruit and fruit juices.  Milk and yogurt.  Sweets and snack foods, such as cake, cookies, candy, chips, soft drinks, and fruit drinks. CARBOHYDRATE COUNTING There are two ways to count the carbohydrates in your food. You can use either of the methods or a combination of both. Reading the "Nutrition Facts" on Packaged Food The "Nutrition Facts" is an area that is included on the labels of almost all packaged food and beverages in the [place]. It includes the serving size of that food or beverage and information about the nutrients in each serving of the food, including the grams (g) of carbohydrate per  serving.  Decide the number of servings of this food or beverage that you will be able to eat or drink. Multiply that number of servings by the number of grams of carbohydrate that is listed on the label for that serving. The total will be the amount of carbohydrates you will be having when you eat or drink this food or beverage. Learning Standard Serving Sizes of Food When you eat food that is not packaged or does not include "Nutrition Facts" on the label, you need to measure the servings in order to count the amount of carbohydrates.A serving of most carbohydrate-rich foods contains about 15 g of carbohydrates. The following list includes serving sizes of carbohydrate-rich foods that provide 15 g ofcarbohydrate per serving:   1 slice of bread (1 oz) or 1 six-inch tortilla.    of a hamburger bun or English muffin.  4-6 crackers.   cup unsweetened dry cereal.    cup hot cereal.   cup rice or pasta.    cup mashed potatoes or  of a large baked potato.  1 cup fresh fruit or one small piece of fruit.    cup canned or frozen fruit or fruit juice.  1 cup milk.   cup plain fat-free yogurt or yogurt sweetened with artificial sweeteners.   cup cooked dried beans or starchy vegetable, such as peas, corn, or potatoes.  Decide the number of standard-size servings that you will eat. Multiply that number of servings by 15 (the grams of carbohydrates  in that serving). For example, if you eat 2 cups of strawberries, you will have eaten 2 servings and 30 g of carbohydrates (2 servings x 15 g = 30 g). For foods such as soups and casseroles, in which more than one food is mixed in, you will need to count the carbohydrates in each food that is included. EXAMPLE OF CARBOHYDRATE COUNTING Sample Dinner  3 oz chicken breast.   cup of brown rice.   cup of corn.  1 cup milk.   1 cup strawberries with sugar-free whipped topping.  Carbohydrate Calculation Step 1: Identify the foods  that contain carbohydrates:   Rice.   Corn.   Milk.   Strawberries. Step 2:Calculate the number of servings eaten of each:   2 servings of rice.   1 serving of corn.   1 serving of milk.   1 serving of strawberries. Step 3: Multiply each of those number of servings by 15 g:   2 servings of rice x 15 g = 30 g.   1 serving of corn x 15 g = 15 g.   1 serving of milk x 15 g = 15 g.   1 serving of strawberries x 15 g = 15 g. Step 4: Add together all of the amounts to find the total grams of carbohydrates eaten: 30 g + 15 g + 15 g + 15 g = 75 g.   This information is not intended to replace advice given to you by your health care provider. Make sure you discuss any questions you have with your health care provider.   Document Released: 09/15/2005 Document Revised: 10/06/2014 Document Reviewed: 08/12/2013 Elsevier Interactive Patient Education [employer]

## 2015-10-24 NOTE — Progress Notes (Signed)
Subjective:    Patient ID: Willie Spencer, male    DOB: 1965/12/13, 50 y.o.   MRN: [id number]  Willie Spencer is a 50 y.o. male who presents for follow-up of Type 2 diabetes mellitus. He has gained weight since last visit. He and his wife have stopped smoking cigarettes. He is now vaping, e-cigs, daily and plans to reduce this as well. He states he has been eating more especially through the holidays. He is not really exercising other than playing basketball one day per week. He is taking Lantus 40 units nightly and Humalog 10 or 15 units three times daily with meals. He states he bases his dose on how many carbs he is eating for that particular meal. He does not check his blood sugar. He has no complaints.  He is getting ready to take a cruise and is looking forward to this.   Patient is not checking home blood sugars.   Home blood sugar records: patient does not check sugars he states he is "lazy and doesn't like to check".  How often is blood sugars being checked: maybe once a week.  Current symptoms include: none. Patient denies polyuria, polydipsia, chest pain, DOE.  Patient is checking their feet daily. Any Foot concerns (callous, ulcer, wound, thickened nails, toenail fungus, skin fungus, hammer toe): none Last dilated eye exam: 2014 due for one now  Current treatments: Continued insulin which has been somewhat effective. Medication compliance: reports good compliance  Current diet: has not been eating healthy, especially around holidays. Current exercise: playing basketball one day weekly Known diabetic complications: none  The following portions of the patient's history were reviewed and updated as appropriate: allergies, current medications, past medical history, past social history and problem list.  ROS as in subjective above.     Objective:    Physical Exam Alert and in no distress otherwise not examined.  Blood pressure 126/82, pulse 64, height  (1.905 m), weight 248  lb (112.492 kg).  Lab Review Diabetic Labs Latest Ref Rng 10/24/2015 07/30/2015 07/09/2015  HbA1c - 8.1% - 6.3%  Microalbumin Not estab mg/dL - 0.4 -  Micro/Creat Ratio <30 mcg/mg creat - 4 -  Chol 125 - 200 mg/dL - 409 -  HDL >=81 mg/dL - 19(J) -  Calc LDL <478 mg/dL - 295 -  Triglycerides <150 mg/dL - 621 -  Creatinine 3.08 - 1.35 mg/dL - 6.57 -   BP/Weight 8/46/9629 07/30/2015 07/09/2015  Systolic BP 126 136 140  Diastolic BP 82 82 84  Wt. (Lbs) 248 235.6 234.5  BMI 31 29.45 29.31   No flowsheet data found.  [name]  reports that he quit smoking about 7 weeks ago. His smoking use included Cigarettes. He smoked 1.00 pack per day. He does not have any smokeless tobacco history on file. He reports that he drinks alcohol. He reports that he does not use illicit drugs.     Assessment & Plan:    Type 1 diabetes mellitus without complication (HCC) - Plan: HgB A1c, Insulin Glargine (LANTUS SOLOSTAR) 100 UNIT/ML Solostar Pen  Smoker  Obesity  1. Rx changes: none refilled Lantus 2. Education: Reviewed 'ABCs' of diabetes management (respective goals in parentheses):  A1C (<7), blood pressure (<130/80), and cholesterol (LDL <100). 3. Compliance at present is estimated to be good. Efforts to improve compliance (if necessary) will be directed at dietary modifications: low carb and reduce sugar intake, increased exercise and regular blood sugar monitoring: fasting blood sugar at least 2-3 times  per week and 2 hours after a meal at least 2 times per week times weekly. 4. Follow up: 3 months and bring blood sugar readings 5. Discussed being more physically active, his BMI now places him in the obese category. He is aware and is motivated to make changes.  6. Recommend getting eye exam, he is overdue.  7. Reduce amount of vaping he is doing. Congratulated him on stopping cigarette smoking however, e-cigs are still harmful to his health.  8. Discussed that if his blood sugars are not within goal  in spite of watching his carbs and sugars that he should let me know so that we can make adjustments to his regimen.

## 2015-11-01 ENCOUNTER — Other Ambulatory Visit: Payer: Self-pay | Admitting: Family Medicine

## 2016-01-22 ENCOUNTER — Ambulatory Visit: Payer: 59 | Admitting: Family Medicine

## 2016-01-23 ENCOUNTER — Ambulatory Visit: Payer: 59 | Admitting: Family Medicine

## 2016-01-24 ENCOUNTER — Telehealth: Payer: Self-pay | Admitting: Internal Medicine

## 2016-01-24 ENCOUNTER — Other Ambulatory Visit: Payer: Self-pay | Admitting: Family Medicine

## 2016-01-24 NOTE — Telephone Encounter (Signed)
Left message for patient to return my call.

## 2016-01-24 NOTE — Telephone Encounter (Signed)
[place]ittany from [facility] called stating that Lantus is no longer covered by pt's insurance but [employer] solostart pen 100 unit/10 will. Please advise and send in new rx to [facility]

## 2016-01-24 NOTE — Telephone Encounter (Signed)
Pt was also advised that he is due for a DM check and he needed to call the office and schedule an appt

## 2016-01-24 NOTE — Telephone Encounter (Signed)
Patient will need to be seen before this can be refilled again.

## 2016-01-24 NOTE — Telephone Encounter (Signed)
Is this okay to refill? Pt is due for DM check and was asked to call and schedule an appt here with us

## 2016-01-24 NOTE — Telephone Encounter (Signed)
Please call and see how much Lantus the patient has on hand and when he will run out. Do we have a sample to give him if he is going to run out by next week? I see he has an appointment next week.

## 2016-01-25 NOTE — Telephone Encounter (Signed)
Left message again for patient to return my call.

## 2016-01-26 ENCOUNTER — Telehealth: Payer: Self-pay | Admitting: Family Medicine

## 2016-01-26 NOTE — Telephone Encounter (Signed)
P.A. LANTUS SOLOSTAR

## 2016-01-28 NOTE — Telephone Encounter (Signed)
Patient has appt tomorrow with Vickie.

## 2016-01-29 ENCOUNTER — Ambulatory Visit (INDEPENDENT_AMBULATORY_CARE_PROVIDER_SITE_OTHER): Payer: 59 | Admitting: Family Medicine

## 2016-01-29 ENCOUNTER — Other Ambulatory Visit: Payer: Self-pay | Admitting: Family Medicine

## 2016-01-29 ENCOUNTER — Encounter: Payer: Self-pay | Admitting: Family Medicine

## 2016-01-29 VITALS — BP 140/88 | HR 64 | Wt 247.8 lb

## 2016-01-29 DIAGNOSIS — I10 Essential (primary) hypertension: Secondary | ICD-10-CM | POA: Diagnosis not present

## 2016-01-29 DIAGNOSIS — Z720 Tobacco use: Secondary | ICD-10-CM | POA: Diagnosis not present

## 2016-01-29 DIAGNOSIS — E079 Disorder of thyroid, unspecified: Secondary | ICD-10-CM

## 2016-01-29 DIAGNOSIS — E109 Type 1 diabetes mellitus without complications: Secondary | ICD-10-CM | POA: Diagnosis not present

## 2016-01-29 DIAGNOSIS — E039 Hypothyroidism, unspecified: Secondary | ICD-10-CM

## 2016-01-29 DIAGNOSIS — R7989 Other specified abnormal findings of blood chemistry: Secondary | ICD-10-CM

## 2016-01-29 DIAGNOSIS — E669 Obesity, unspecified: Secondary | ICD-10-CM | POA: Diagnosis not present

## 2016-01-29 DIAGNOSIS — F17200 Nicotine dependence, unspecified, uncomplicated: Secondary | ICD-10-CM

## 2016-01-29 LAB — CBC WITH DIFFERENTIAL/PLATELET
Basophils Absolute: 73 cells/uL (ref 0–200)
Basophils Relative: 1 %
Eosinophils Absolute: 365 cells/uL (ref 15–500)
Eosinophils Relative: 5 %
HCT: 40.9 % (ref 38.5–50.0)
HEMOGLOBIN: 13.7 g/dL (ref 13.2–17.1)
LYMPHS PCT: 24 %
Lymphs Abs: 1752 cells/uL (ref 850–3900)
MCH: 31.5 pg (ref 27.0–33.0)
MCHC: 33.5 g/dL (ref 32.0–36.0)
MCV: 94 fL (ref 80.0–100.0)
MONOS PCT: 9 %
MPV: 10.5 fL (ref 7.5–12.5)
Monocytes Absolute: 657 cells/uL (ref 200–950)
NEUTROS PCT: 61 %
Neutro Abs: 4453 cells/uL (ref 1500–7800)
PLATELETS: 291 10*3/uL (ref 140–400)
RBC: 4.35 MIL/uL (ref 4.20–5.80)
RDW: 13 % (ref 11.0–15.0)
WBC: 7.3 10*3/uL (ref 4.0–10.5)

## 2016-01-29 LAB — COMPREHENSIVE METABOLIC PANEL
ALBUMIN: 4.4 g/dL (ref 3.6–5.1)
ALT: 25 U/L (ref 9–46)
AST: 34 U/L (ref 10–40)
Alkaline Phosphatase: 77 U/L (ref 40–115)
BUN: 11 mg/dL (ref 7–25)
CHLORIDE: 100 mmol/L (ref 98–110)
CO2: 28 mmol/L (ref 20–31)
Calcium: 8.8 mg/dL (ref 8.6–10.3)
Creat: 0.97 mg/dL (ref 0.60–1.35)
Glucose, Bld: 224 mg/dL — ABNORMAL HIGH (ref 65–99)
POTASSIUM: 4.4 mmol/L (ref 3.5–5.3)
Sodium: 135 mmol/L (ref 135–146)
TOTAL PROTEIN: 6.5 g/dL (ref 6.1–8.1)
Total Bilirubin: 0.5 mg/dL (ref 0.2–1.2)

## 2016-01-29 LAB — TSH: TSH: 61.14 m[IU]/L — AB (ref 0.40–4.50)

## 2016-01-29 LAB — POCT GLYCOSYLATED HEMOGLOBIN (HGB A1C)

## 2016-01-29 MED ORDER — BASAGLAR KWIKPEN 100 UNIT/ML ~~LOC~~ SOPN: 40.0000 [IU] | PEN_INJECTOR | Freq: Every day | SUBCUTANEOUS | Status: DC

## 2016-01-29 NOTE — Progress Notes (Signed)
Subjective:    Patient ID: Willie Spencer, male    DOB: July 21, 1966, 50 y.o.   MRN: [id number]  Willie LeepDarin S Jolicoeur is a 50 y.o. male who presents for follow-up of Type 1 diabetes mellitus. Diagnosed at age 50 by Dr. Osvaldo ShipperLapour in [place]. Dr. Fredrich BirksSoloman at [facility] was Endocrinologist. States he missed an appointment and was dismissed from the practice. He would like to see a Endocrinologist in [place] area if possible.  He does state he is taking blood pressure medication but does not check blood pressure outside of here. He is aware that his blood pressure is elevated today, states he drank 2 cups of coffee, smoked a cigarette and did not take his blood pressure medication until 15 minutes prior to arrival to appointment.   Patient is checking home blood sugars.   Home blood sugar records: BGs range between 50 and 250.  He woke up sweating in the middle of the night on 2 separate occasions and staetes his blood sugar was 50. States he ate pizza when his blood sugar was 250. States he is aware of the fluctuations with diet and exercise.   How often is blood sugars being checked: 4-5 times a week Current symptoms include: none. Patient denies increased appetite, nausea, visual disturbances, vomiting and weight loss.  Patient is checking their feet daily. Any Foot concerns (callous, ulcer, wound, thickened nails, toenail fungus, skin fungus, hammer toe): none Last dilated eye exam- due for one.   Current treatments: taking diabetes meds. lantus 40 units every evening. His insurance stopped covering Lantus and he needs to be switched to [employer] per Vernona RiegerLaura.  Medication compliance: excellent  Current diet: in general, a "healthy" diet   occasionally eats pizza and bread and eats oreos nightly  Current exercise: walking Known diabetic complications: none  The following portions of the patient's history were reviewed and updated as appropriate: allergies, current medications, past medical  history, past social history and problem list.  ROS as in subjective above.     Objective:    Physical Exam Alert and in no distress otherwise not examined.  Blood pressure 140/88, pulse 64, weight 247 lb 12.8 oz (112.401 kg).  Lab Review Diabetic Labs Latest Ref Rng 01/29/2016 10/24/2015 07/30/2015 07/09/2015  HbA1c - 7.4% 8.1% - 6.3%  Microalbumin Not estab mg/dL - - 0.4 -  Micro/Creat Ratio <30 mcg/mg creat - - 4 -  Chol 125 - 200 mg/dL - - 409165 -  HDL >=81>=40 mg/dL - - 19(J37(L) -  Calc LDL <478<130 mg/dL - - 295104 -  Triglycerides <150 mg/dL - - 621120 -  Creatinine 3.080.60 - 1.35 mg/dL - - 6.570.84 -   BP/Weight 01/29/2016 10/24/2015 07/30/2015 07/09/2015  Systolic BP 140 126 136 140  Diastolic BP 88 82 82 84  Wt. (Lbs) 247.8 248 235.6 234.5  BMI 30.97 31 29.45 29.31   No flowsheet data found.  Bazil  reports that he quit smoking about 5 months ago. His smoking use included Cigarettes. He smoked 1.00 pack per day. He does not have any smokeless tobacco history on file. He reports that he drinks alcohol. He reports that he does not use illicit drugs.     Assessment & Plan:    Type 1 diabetes mellitus without complication (HCC) - Plan: POCT glycosylated hemoglobin (Hb A1C), CBC with Differential/Platelet, Comprehensive metabolic panel, TSH, Insulin Glargine (BASAGLAR KWIKPEN) 100 UNIT/ML SOPN, Insulin Glargine (BASAGLAR KWIKPEN) 100 UNIT/ML SOPN, Microalbumin / creatinine urine ratio  Thyroid disease -  Plan: TSH  Smoker  Obesity  Essential hypertension - Plan: CBC with Differential/Platelet, Comprehensive metabolic panel    1. Rx changes: switched to Basaglar today, 40 units every evening.  due to insurance coverage, same dose as he was on with Lantus.  2. Education: Reviewed 'ABCs' of diabetes management (respective goals in parentheses):  A1C (<7), blood pressure (<130/80), and cholesterol (LDL <100). A1c improved from 8.1% to 7.4% 3. Compliance at present is estimated to be excellent.  Efforts to improve compliance (if necessary) will be directed at dietary modifications: watch carbohydrates, walk more than just at work., recommend at least 150 minutes of physical activity per week.  4. Microalbumin ordered today. 5. Follow up: 4 months for Diabetes check 6. Continue to cut back on smoking and stop if he can.  7. HTN: check blood pressure at home and let me know if it is staying elevated >130/80. We may need to adjust HTN medications.  8. Referral made to Endocrinologist for help with Type I diabetes and thyroid disorder. History of ablation and started on Levothyroxine. Will check TSH today.

## 2016-01-29 NOTE — Telephone Encounter (Signed)
Pt is here for an appt. Will have vickie discuss at appt

## 2016-01-30 ENCOUNTER — Telehealth: Payer: Self-pay | Admitting: Family Medicine

## 2016-01-30 LAB — T4: T4, Total: 4.5 ug/dL (ref 4.5–12.0)

## 2016-01-30 LAB — MICROALBUMIN / CREATININE URINE RATIO
Creatinine, Urine: 76 mg/dL (ref 20–370)
MICROALB UR: 0.2 mg/dL
Microalb Creat Ratio: 3 mcg/mg creat (ref <30)

## 2016-01-30 LAB — T3: T3 TOTAL: 74 ng/dL — AB (ref 76–181)

## 2016-01-30 NOTE — Telephone Encounter (Signed)
Called to discuss lab results and the fact that his TSH is elevated. He states he is taking his daily Synthroid approximately 5 days per week but usually forgets on the weekend to take it. Discussed that I am adding more tests to look at thyroid function and will get back to him regarding results and intervention if needed.

## 2016-01-31 NOTE — Addendum Note (Signed)
Addended by: Herminio CommonsJOHNSON, SABRINA A on: 01/31/2016 09:32 AM   Modules accepted: Orders

## 2016-02-01 NOTE — Telephone Encounter (Signed)
P.A. Gibson Rampenied, Pt must try and fail both Basaglar & Levemir.  Do you want to switch?

## 2016-02-03 NOTE — Telephone Encounter (Signed)
It was my understanding that we needed to switch him from Lantus to [employer], is that correct? He has an allergy to Levemir so that one is not an option. Just let me know what I need to do please. Thanks.

## 2016-02-04 NOTE — Telephone Encounter (Signed)
The order is in, prescription was sent to pharmacy are and he was given 2 samples. Thanks.

## 2016-02-04 NOTE — Telephone Encounter (Signed)
Yes please switch to Basaglar, Pt will not have to try Levemir again because he has an allergy and if Basaglar doesn't work then should be able to get Lantus approved

## 2016-02-05 ENCOUNTER — Other Ambulatory Visit: Payer: Self-pay | Admitting: Family Medicine

## 2016-02-18 ENCOUNTER — Encounter: Payer: Self-pay | Admitting: Internal Medicine

## 2016-03-27 ENCOUNTER — Other Ambulatory Visit: Payer: Self-pay | Admitting: Family Medicine

## 2016-03-27 NOTE — Telephone Encounter (Signed)
Pt uses this and the basaglar. Pt is has not yet scheduled with a Endocrinology in [place] yet but will call and schedule something soon. i will refill med for now.

## 2016-05-01 ENCOUNTER — Other Ambulatory Visit: Payer: Self-pay | Admitting: Family Medicine

## 2016-05-26 ENCOUNTER — Other Ambulatory Visit: Payer: Self-pay | Admitting: Family Medicine

## 2016-05-30 ENCOUNTER — Other Ambulatory Visit: Payer: Self-pay | Admitting: Family Medicine

## 2016-05-30 DIAGNOSIS — E109 Type 1 diabetes mellitus without complications: Secondary | ICD-10-CM

## 2016-07-25 ENCOUNTER — Other Ambulatory Visit: Payer: Self-pay | Admitting: Family Medicine

## 2016-07-30 ENCOUNTER — Ambulatory Visit (INDEPENDENT_AMBULATORY_CARE_PROVIDER_SITE_OTHER): Payer: 59 | Admitting: Family Medicine

## 2016-07-30 ENCOUNTER — Encounter: Payer: Self-pay | Admitting: Family Medicine

## 2016-07-30 VITALS — BP 140/90 | HR 60 | Wt 249.8 lb

## 2016-07-30 DIAGNOSIS — I10 Essential (primary) hypertension: Secondary | ICD-10-CM

## 2016-07-30 DIAGNOSIS — Z23 Encounter for immunization: Secondary | ICD-10-CM | POA: Diagnosis not present

## 2016-07-30 DIAGNOSIS — F17200 Nicotine dependence, unspecified, uncomplicated: Secondary | ICD-10-CM

## 2016-07-30 DIAGNOSIS — E109 Type 1 diabetes mellitus without complications: Secondary | ICD-10-CM

## 2016-07-30 DIAGNOSIS — R7989 Other specified abnormal findings of blood chemistry: Secondary | ICD-10-CM

## 2016-07-30 DIAGNOSIS — R946 Abnormal results of thyroid function studies: Secondary | ICD-10-CM

## 2016-07-30 LAB — CBC WITH DIFFERENTIAL/PLATELET
BASOS PCT: 1 %
Basophils Absolute: 76 cells/uL (ref 0–200)
EOS ABS: 304 {cells}/uL (ref 15–500)
EOS PCT: 4 %
HCT: 39.8 % (ref 38.5–50.0)
Hemoglobin: 13.5 g/dL (ref 13.2–17.1)
Lymphocytes Relative: 27 %
Lymphs Abs: 2052 cells/uL (ref 850–3900)
MCH: 31 pg (ref 27.0–33.0)
MCHC: 33.9 g/dL (ref 32.0–36.0)
MCV: 91.5 fL (ref 80.0–100.0)
MONOS PCT: 9 %
MPV: 10.6 fL (ref 7.5–12.5)
Monocytes Absolute: 684 cells/uL (ref 200–950)
NEUTROS ABS: 4484 {cells}/uL (ref 1500–7800)
Neutrophils Relative %: 59 %
PLATELETS: 297 10*3/uL (ref 140–400)
RBC: 4.35 MIL/uL (ref 4.20–5.80)
RDW: 13 % (ref 11.0–15.0)
WBC: 7.6 10*3/uL (ref 4.0–10.5)

## 2016-07-30 LAB — COMPLETE METABOLIC PANEL WITH GFR
ALT: 26 U/L (ref 9–46)
AST: 22 U/L (ref 10–40)
Albumin: 4.3 g/dL (ref 3.6–5.1)
Alkaline Phosphatase: 85 U/L (ref 40–115)
BILIRUBIN TOTAL: 0.5 mg/dL (ref 0.2–1.2)
BUN: 12 mg/dL (ref 7–25)
CHLORIDE: 100 mmol/L (ref 98–110)
CO2: 27 mmol/L (ref 20–31)
CREATININE: 0.84 mg/dL (ref 0.60–1.35)
Calcium: 8.8 mg/dL (ref 8.6–10.3)
GFR, Est African American: >89 mL/min (ref >=60)
GFR, Est Non African American: >89 mL/min (ref >=60)
GLUCOSE: 226 mg/dL — AB (ref 65–99)
Potassium: 4.1 mmol/L (ref 3.5–5.3)
SODIUM: 135 mmol/L (ref 135–146)
TOTAL PROTEIN: 6.7 g/dL (ref 6.1–8.1)

## 2016-07-30 LAB — TSH: TSH: 8.46 m[IU]/L — AB (ref 0.40–4.50)

## 2016-07-30 LAB — POCT GLYCOSYLATED HEMOGLOBIN (HGB A1C): Hemoglobin A1C: 7.6

## 2016-07-30 LAB — T4, FREE: Free T4: 1.2 ng/dL (ref 0.8–1.8)

## 2016-07-30 NOTE — Progress Notes (Signed)
Subjective:    Patient ID: Willie Spencer, male    DOB: Jan 27, 1966, 50 y.o.   MRN: [id number]  Willie Spencer is a 50 y.o. male who presents for follow-up of Type 2 diabetes mellitus.  Denies having low blood sugars. Sates he has had blood sugars in the 300s.  Reports taking daily humalog 10-15 units at mealtimes and Basaglar 40 units at after supper. He was referred to an Endocrinologist in May but he did not go to his appointment. He does not give a specific reason for not going and states he is interested in being scheduled again. His Thyroid function was not in goal range and he admitted to non compliance with his levothyroxine at that time. He states he has been taking his medication daily and not missing doses.  Denies fever, chills, headache, dizziness, vision changes, chest pain, palpitations, DOE, abdominal pain, N/V/D, GU symptoms, LE edema.   Smoking 4-5 cigarettes per day. Drinks beer on the weekend.   Patient is checking home blood sugars but not often.    Home blood sugar records: checking sugars off and on How often is blood sugars being checked: 4-5 times a week Current symptoms include: none. Patient denies nausea, visual disturbances, vomiting and weight loss.  Patient is checking their feet daily. Any Foot concerns (callous, ulcer, wound, thickened nails, toenail fungus, skin fungus, hammer toe): none Last dilated eye exam: at least 2 years ago. He did not go as recommended.   Current treatments: take meds regularly Medication compliance: excellent  Current diet: in general, a "healthy" diet   Current exercise: none Known diabetic complications: none  The following portions of the patient's history were reviewed and updated as appropriate: allergies, current medications, past medical history, past social history and problem list.  ROS as in subjective above.     Objective:    Physical Exam Alert and in no distress.  Pharyngeal area is normal. Neck is supple without  adenopathy or thyromegaly. Cardiac exam shows a regular sinus rhythm without murmurs or gallops. Lungs are clear to auscultation.   Blood pressure 140/90, pulse 60, weight 249 lb 12.8 oz (113.3 kg).  Lab Review Diabetic Labs Latest Ref Rng & Units 01/29/2016 10/24/2015 07/30/2015 07/09/2015  HbA1c - 7.4% 8.1% - 6.3%  Microalbumin Not estab mg/dL 0.2 - 0.4 -  Micro/Creat Ratio <30 mcg/mg creat 3 - 4 -  Chol 125 - 200 mg/dL - - 782165 -  HDL >=95>=40 mg/dL - - 62(Z37(L) -  Calc LDL <308<130 mg/dL - - 657104 -  Triglycerides <150 mg/dL - - 846120 -  Creatinine 9.620.60 - 1.35 mg/dL 9.520.97 - 8.410.84 -   BP/Weight 07/30/2016 01/29/2016 10/24/2015 07/30/2015 07/09/2015  Systolic BP 140 140 126 136 140  Diastolic BP 90 88 82 82 84  Wt. (Lbs) 249.8 247.8 248 235.6 234.5  BMI 31.22 30.97 31 29.45 29.31   No flowsheet data found.  Kawan  reports that he has been smoking Cigarettes.  He has been smoking about 1.00 pack per day. He has never used smokeless tobacco. He reports that he drinks alcohol. He reports that he does not use drugs.     Assessment & Plan:    Type 1 diabetes mellitus without complication (HCC) - Plan: CBC with Differential/Platelet, COMPLETE METABOLIC PANEL WITH GFR, POCT glycosylated hemoglobin (Hb A1C)  Essential hypertension - Plan: CBC with Differential/Platelet, COMPLETE METABOLIC PANEL WITH GFR  Smoker  Elevated TSH - Plan: TSH, T4, Free, T3  Need for influenza  vaccination - Plan: Flu Vaccine QUAD 36+ mos IM  1. Rx changes: none  A1C 7.6%  2. Education: Reviewed 'ABCs' of diabetes management (respective goals in parentheses):  A1C (<7), blood pressure (<130/80), and cholesterol (LDL <100). 3. Compliance at present is estimated to be fair. Efforts to improve compliance (if necessary) will be directed at dietary modifications: Reduce sugar, carbohydrates and starches, increased exercise and regular blood sugar monitoring: 3 times daily. 4. Follow up: Advised him to call and schedule an appointment  with the endocrinologist. 5. He did not follow-up for repeat thyroid panel. Plan to recheck this today. 6. Discussed that noncompliance with lifestyle modifications, medications, and management of DM, HTN and thyroid poses hazardous risks to his health. He is aware of this.  7. Flu shot given 8. Counseled on smoking cessation. He will follow-up with me for a complete physical exam and fasting labs at his convenience.

## 2016-07-30 NOTE — Patient Instructions (Addendum)
Start checking your blood sugars more regularly.  Call and schedule an appointment with the endocrinologist as discussed. Dr. Patrecia PaceMorayati [phone] You have not had a physical as far as I can tell in the past year or two so please schedule to have a complete physical exam and fasting labs at your convenience. Try to stop smoking.    Basic Carbohydrate Counting for Diabetes Mellitus Carbohydrate counting is a method for keeping track of the amount of carbohydrates you eat. Eating carbohydrates naturally increases the level of sugar (glucose) in your blood, so it is important for you to know the amount that is okay for you to have in every meal. Carbohydrate counting helps keep the level of glucose in your blood within normal limits. The amount of carbohydrates allowed is different for every person. A dietitian can help you calculate the amount that is right for you. Once you know the amount of carbohydrates you can have, you can count the carbohydrates in the foods you want to eat. Carbohydrates are found in the following foods:  Grains, such as breads and cereals.  Dried beans and soy products.  Starchy vegetables, such as potatoes, peas, and corn.  Fruit and fruit juices.  Milk and yogurt.  Sweets and snack foods, such as cake, cookies, candy, chips, soft drinks, and fruit drinks. CARBOHYDRATE COUNTING There are two ways to count the carbohydrates in your food. You can use either of the methods or a combination of both. Reading the "Nutrition Facts" on Packaged Food The "Nutrition Facts" is an area that is included on the labels of almost all packaged food and beverages in the [place]. It includes the serving size of that food or beverage and information about the nutrients in each serving of the food, including the grams (g) of carbohydrate per serving.  Decide the number of servings of this food or beverage that you will be able to eat or drink. Multiply that number of servings by  the number of grams of carbohydrate that is listed on the label for that serving. The total will be the amount of carbohydrates you will be having when you eat or drink this food or beverage. Learning Standard Serving Sizes of Food When you eat food that is not packaged or does not include "Nutrition Facts" on the label, you need to measure the servings in order to count the amount of carbohydrates.A serving of most carbohydrate-rich foods contains about 15 g of carbohydrates. The following list includes serving sizes of carbohydrate-rich foods that provide 15 g ofcarbohydrate per serving:   1 slice of bread (1 oz) or 1 six-inch tortilla.    of a hamburger bun or English muffin.  4-6 crackers.   cup unsweetened dry cereal.    cup hot cereal.   cup rice or pasta.    cup mashed potatoes or  of a large baked potato.  1 cup fresh fruit or one small piece of fruit.    cup canned or frozen fruit or fruit juice.  1 cup milk.   cup plain fat-free yogurt or yogurt sweetened with artificial sweeteners.   cup cooked dried beans or starchy vegetable, such as peas, corn, or potatoes.  Decide the number of standard-size servings that you will eat. Multiply that number of servings by 15 (the grams of carbohydrates in that serving). For example, if you eat 2 cups of strawberries, you will have eaten 2 servings and 30 g of carbohydrates (2 servings x 15 g = 30 g).  For foods such as soups and casseroles, in which more than one food is mixed in, you will need to count the carbohydrates in each food that is included. EXAMPLE OF CARBOHYDRATE COUNTING Sample Dinner  3 oz chicken breast.   cup of brown rice.   cup of corn.  1 cup milk.   1 cup strawberries with sugar-free whipped topping.  Carbohydrate Calculation Step 1: Identify the foods that contain carbohydrates:   Rice.   Corn.   Milk.   Strawberries. Step 2:Calculate the number of servings eaten of each:    2 servings of rice.   1 serving of corn.   1 serving of milk.   1 serving of strawberries. Step 3: Multiply each of those number of servings by 15 g:   2 servings of rice x 15 g = 30 g.   1 serving of corn x 15 g = 15 g.   1 serving of milk x 15 g = 15 g.   1 serving of strawberries x 15 g = 15 g. Step 4: Add together all of the amounts to find the total grams of carbohydrates eaten: 30 g + 15 g + 15 g + 15 g = 75 g.   This information is not intended to replace advice given to you by your health care provider. Make sure you discuss any questions you have with your health care provider.   Document Released: 09/15/2005 Document Revised: 10/06/2014 Document Reviewed: 08/12/2013 Elsevier Interactive Patient Education [employer]2016 Elsevier Inc.

## 2016-07-31 LAB — T3: T3, Total: 118 ng/dL (ref 76–181)

## 2016-08-06 ENCOUNTER — Telehealth: Payer: Self-pay | Admitting: Internal Medicine

## 2016-08-06 MED ORDER — INSULIN LISPRO 100 UNIT/ML (KWIKPEN): PEN_INJECTOR | SUBCUTANEOUS | 1 refills | Status: DC

## 2016-08-06 MED ORDER — PEN NEEDLES 31G X 6 MM MISC: 1 refills | Status: DC

## 2016-08-06 NOTE — Telephone Encounter (Signed)
Pharmacy called and didn't have any humalog and was out. I have refilled med

## 2016-08-14 ENCOUNTER — Telehealth: Payer: Self-pay | Admitting: Family Medicine

## 2016-08-14 NOTE — Telephone Encounter (Signed)
Recv'd fax from [facility] for Omni Pump.  Discussed with Chip BoerVicki & she wants pt to be managed by Endocrinologist for his insulin pump and Type 1 Diabetes.  I spoke with [facility] and advised we would not be signing this order.  I called & spoke with pt and advised that Chip BoerVicki will still see him for all his other medical care and issues but he needs to see Endocrinologist to manage his Type 1 diabetes and insulin pump that is the best care for him.  He is agreeable but states it would be 60 days before he can get in and I advised to try a different endo and see if he can get in any sooner.

## 2016-08-15 ENCOUNTER — Telehealth: Payer: Self-pay | Admitting: Family Medicine

## 2016-08-15 DIAGNOSIS — E109 Type 1 diabetes mellitus without complications: Secondary | ICD-10-CM

## 2016-08-15 NOTE — Telephone Encounter (Signed)
I have faxed over referral info to Dr. Patrecia PaceMorayati

## 2016-08-15 NOTE — Telephone Encounter (Signed)
Pt requesting referral to see Dr Patrec[facility]ati [facility]. Pt said Vickie asked him to see an endocrinologist so this is the doctor he would like to see

## 2016-09-01 ENCOUNTER — Other Ambulatory Visit: Payer: Self-pay | Admitting: Family Medicine

## 2016-09-05 ENCOUNTER — Encounter: Payer: Self-pay | Admitting: Internal Medicine

## 2016-09-05 ENCOUNTER — Ambulatory Visit (INDEPENDENT_AMBULATORY_CARE_PROVIDER_SITE_OTHER): Payer: 59 | Admitting: Internal Medicine

## 2016-09-05 VITALS — BP 148/84 | HR 78 | Ht 75.0 in | Wt 250.0 lb

## 2016-09-05 DIAGNOSIS — E890 Postprocedural hypothyroidism: Secondary | ICD-10-CM

## 2016-09-05 DIAGNOSIS — E109 Type 1 diabetes mellitus without complications: Secondary | ICD-10-CM

## 2016-09-05 DIAGNOSIS — E1065 Type 1 diabetes mellitus with hyperglycemia: Secondary | ICD-10-CM

## 2016-09-05 NOTE — Progress Notes (Signed)
Patient ID: Willie Spencer, male   DOB: September 20, 1966, 50 y.o.   MRN: [id number]   HPI: Willie Spencer is a 50 y.o.-year-old male, referred by his PCP, Harland Dingwall, NP, for management of DM1, dx 1987, uncontrolled, without complications and post ablative hypothyroidism. He saw Dr. Gabriel Carina in the past, last visit 2015.  Patient has been diagnosed with diabetes at 50; he started on insulin after 2 years.   He would like to obtain an Omnipod pump - paperwork needs to be filled out  Last hemoglobin A1c was: Lab Results  Component Value Date   HGBA1C 7.6% 07/30/2016   HGBA1C 7.4% 01/29/2016   HGBA1C 8.1% 10/24/2015   He is on a regimen of: - Lantus 40 units at dinnertime - Humalog 10-15 units 3x a day - AC  Pt checks his sugars 0-1 a day during the week and >4x a day during the weekend and they are: - am: 120-180 - 2h after b'fast: n/c - before lunch: n/c - 2h after lunch: n/c - before dinner: n/c - 2h after dinner: 90-120 (active at work) - bedtime: 120, 160s - nighttime: n/c Lowest sugar was 30s (wife called EMS - ~2013); he has hypoglycemia awareness at 72. No previous hypoglycemia admission. Does have a glucagon kit at home. Highest sugar was 400s- seldom; usually <250. No previous DKA admissions.    Pt's meals are: - Breakfast: sausage biscuits, orange crackers - Lunch: sandwich; fast food - Dinner: meat + veggies + starch - Snacks: at bedtime; 3: fruit, PB crackers, nuts Diet Mtn Dew.   - no CKD, last BUN/creatinine:  Lab Results  Component Value Date   BUN 12 07/30/2016   BUN 11 01/29/2016   CREATININE 0.84 07/30/2016   CREATININE 0.97 01/29/2016  On Losartan. - last set of lipids: Lab Results  Component Value Date   CHOL 165 07/30/2015   HDL 37 (L) 07/30/2015   LDLCALC 104 07/30/2015   TRIG 120 07/30/2015   CHOLHDL 4.5 07/30/2015   - last eye exam was in 2015. No DR.  - no numbness and tingling in his feet.  He also has post ablative hypothyroidism: - history  of Graves' disease, s/p RAI treatment 08/20/2012.  He has had fluctuating TSH levels in the past: Lab Results  Component Value Date   TSH 8.46 (H) 07/30/2016   TSH 61.14 (H) 01/29/2016   TSH 1.697 07/30/2015   FREET4 1.2 07/30/2016   He is on levothyroxine 200 mcg daily: - Fasting - With water - Separated by >30 minutes from breakfast - No calcium, iron, PPI - + multivitamins (!) - + coffe + creamer with LT4 (!)  ROS: Constitutional: + weight gain, no fatigue, no subjective hyperthermia/hypothermia Eyes: no blurry vision, no xerophthalmia ENT: no sore throat, no nodules palpated in throat, no dysphagia/odynophagia, no hoarseness Cardiovascular: no CP/SOB/palpitations/leg swelling Respiratory: no cough/SOB Gastrointestinal: no N/V/D/C/+ heartburn Musculoskeletal: no muscle/joint aches Skin: no rashes, + itching, + easy bruising Neurological: no tremors/numbness/tingling/dizziness Psychiatric: no depression/anxiety  Past Medical History:  Diagnosis Date  . Diabetes mellitus without complication ([place])   . Hypertension   . Thyroid disease    No past surgical history on file. Social History   Social History  . Marital status: Married    Spouse name: N/A  . Number of children: 2   Occupational History  .  Maintenance supervisor   Social History Main Topics  . Smoking status: Current Every Day Smoker    Packs/day: 1.00  Types: Cigarettes    Last attempt to quit: 08/30/2015  . Smokeless tobacco: Never Used     Comment: uses ecigs  . Alcohol use      Comment: 6 beers per weekend  . Drug use: No   Current Outpatient Prescriptions on File Prior to Visit  Medication Sig Dispense Refill  . Insulin Glargine (BASAGLAR KWIKPEN) 100 UNIT/ML SOPN INJECT 0.4 MLS (40 UNITS TOTAL) INTO THESKIN AT BEDTIME 15 mL 1  . insulin lispro (HUMALOG KWIKPEN) 100 UNIT/ML KiwkPen INJECT 10-15 UNITS 3 TIMES DAILY BEFORE MEALS 15 mL 1  . Insulin Pen Needle (PEN NEEDLES) 31G X 6 MM MISC Use  with insulin pen 100 each 1  . levothyroxine (SYNTHROID, LEVOTHROID) 200 MCG tablet TAKE ONE TABLET BY MOUTH EVERY MORNING BEFORE BREAKFAST. 30 tablet 2  . losartan (COZAAR) 25 MG tablet TAKE 1 TABLET BY MOUTH DAILY 30 tablet 0   No current facility-administered medications on file prior to visit.    Allergies  Allergen Reactions  . Wellbutrin [Bupropion]     Gum swelling  . Levemir [Insulin Detemir] Rash   Family History  Problem Relation Age of Onset  . Diabetes Paternal Grandfather   . Stroke Paternal Grandfather   . Heart disease Paternal Grandfather     PE: BP (!) 148/84   Pulse 78   Ht _0  (1.905 m)   Wt 250 lb (113.4 kg)   BMI 31.25 kg/m  Wt Readings from Last 3 Encounters:  09/05/16 250 lb (113.4 kg)  07/30/16 249 lb 12.8 oz (113.3 kg)  01/29/16 247 lb 12.8 oz (112.4 kg)   Constitutional: overweight, in NAD Eyes: PERRLA, EOMI, no exophthalmos ENT: moist mucous membranes, no thyromegaly, no cervical lymphadenopathy Cardiovascular: RRR, No MRG Respiratory: CTA B Gastrointestinal: abdomen soft, NT, ND, BS+ Musculoskeletal: no deformities, strength intact in all 4 Skin: moist, warm, no rashes Neurological: no tremor with outstretched hands, DTR normal in all 4  ASSESSMENT: 1. DM1, uncontrolled, without complications  2. Post-ablative hypothyroidism  PLAN:  1. Patient with long-standing, uncontrolled DM1, on Basal- bolus insulin regimen. He has straight doses with meals, and does not check his sugars during the day or use a sliding scale. Overall, his blood sugar control is not very poor, with an A1c of 7.6%, but we discussed that this is ideally lower than 7. I strongly advised him to start checking his sugars at least 3 times a day.  - He is interested in a CGM, but he would initially want to obtain a Dexcom insulin pump and he already had carb counting and pre-pump training. I will fill out his paperwork and fax it over so he can obtain the pump. I advised him  to schedule an appointment with [facility] Reader to have the pump attached. - For now, I advised him to increase the Lantus since his sugars in a.m. are higher then the ones at bedtime - We discussed about changes to his insulin regimen, as follows:  Patient Instructions  Please increase: - Lantus to 44 units at bedtime.  Continue Humalog: - 10-15 units before meals.  Please schedule an appt with Leonia Reader for diabetes education.  - given sugar log and advised how to fill it and to bring it at next appt  - given foot care handout and explained the principles  - given instructions for hypoglycemia management "15-15 rule"  - advised for yearly eye exams - advised to get ketone strips - advised to always have Glu tablets  with him - advised for a Med-alert bracelet mentioning "type 1 diabetes mellitus". - given instruction Re: exercising and driving in DM1 (pt instructions) - no signs of other autoimmune disorders - Return to clinic in 1.5 mo with sugar log or pump  2. Post-ablative hypothyroidism - Uncontrolled - Reinforced compliance with levothyroxine  - Advised him to take the thyroid hormone every day, with water, at least 30 minutes before breakfast, separated by at least 4 hours from: - acid reflux medications - calcium - iron - multivitamins - He is now taking his levothyroxine along with multivitamins and coffee plus creamer. Advised to move the multivitamins at least 4 hours later in the coffee with creamer at least 30 minutes late. Due to these changes, I will see advised him to decrease his levothyroxine to 175 g daily - Will recheck TFTs at next visit  - time spent with the patient: 1 hour, of which >50% was spent in obtaining information about hisdisease, reviewing previous labs, office visit notes, hospitalization records, and DM treatments, counseling pt about his condition (please see the discussed topics above), and developing a plan to prevent further hypoglycemia  and hyperglycemia. We also discussed about his hypothyroidism. Pt had a number of questions which I addressed.  Philemon Kingdom, MD PhD [facility]

## 2016-09-05 NOTE — Patient Instructions (Addendum)
Please increase: - Lantus to 44 units at bedtime.  Continue Humalog: - 10-15 units before meals.  Please schedule an appt with Cristy FolksLinda Spagnola for diabetes education.  Please continue levothyroxine 175 g daily.  Take the thyroid hormone every day, with water, at least 30 minutes before breakfast, separated by at least 4 hours from: - acid reflux medications - calcium - iron - multivitamins  Please move the Multivitamins to lunchtime or later and the coffee 30 moin after Levothyroxine.  Basic Rules for Patients with Type I Diabetes Mellitus  1. The American Diabetes Association (ADA) recommended targets: - fasting sugar <130 - after meal sugar <180 - HbA1C <7%  2. Engage in ?150 min moderate exercise per week  3. Make sure you have ?8h of sleep every night as this helps both blood sugars and your weight.  4. Always keep a sugar log (not only record in your meter) and bring it to all appointments with us.  5. If you are on a pump, know how to access the settings and to modify the parameters.  6.  Remember, you can always call the number on the back of the pump for emergencies related to the pump.  7. "15-15 rule" for hypoglycemia: if sugars are low, take 15 g of carbs** ("fast sugar" - e.g. 4 glucose tablets, 4 oz orange juice), wait 15 min, then check sugars again. If still <80, repeat. Continue  until your sugars >80, then eat a normal meal.   8. Teach family members and coworkers to inject glucagon. Have a glucagon set at home and one at work. They should call 911 after using the set.  9. If you are on a pump, set "insulin on board" time for 5 hours (if your sugars tend to be higher, can use 4 hours).   10. If you are on a pump, use the "dual wave bolus" setting for high fat foods (e.g. pizza). Start with a setting of 50%-50% (50% instant bolus and 50% prolonged bolus over 3h, for e.g.).    11. If you are on a pump, make sure the basal daily insulin dose is approximately  equal (not larger) to the daily insulin you get from boluses, otherwise you are at risk for hypoglycemia.  12. Check sugar before driving. If <100, correct, and only start driving if sugars rise ?161100. Check sugar every hour when on a long drive.  13. Check sugar before exercising. If <100, correct, and only start exercising if sugars rise ?100. Check sugar every hour when on a long exercise routine and 1h after you finished exercising.   If >250, check urine for ketones. If you have moderate-large ketones in urine, do not start exercise. Hydrate yourself with clear liquids and correct the high sugar. Recheck sugars and ketones before attempting to exercise.  Be aware that you might need less insulin when exercising.  *intense, short, exercise bursts can increase your sugars, but  *less intense, longer (>1h), exercise routines can decrease your sugars.  If you are on a pump, you might need to decrease your basal rate by 10% or more (or even disconnect your pump) while you exercise to prevent low sugars. Do not disconnect your pump by more than 3 hours at a time! You also might need to decrease your insulin bolus for the meal prior to your exercise time by 20% or more.  14. Make sure you have a MedAlert bracelet or pendant mentioning "Type I Diabetes Mellitus". If you have a prior  episode of severe hypoglycemia or hypoglycemia unawareness, it should also mention this.  15. Please do not walk barefoot. Inspect your feet for sores/cuts and let us know if you have them.  16. Please call [facility] with any questions and concerns [phone]).   **E.g. of "fast carbs": ? first choice (15 g):  1 tube glucose gel, GlucoPouch 15, 2 oz glucose liquid ? second choice (15-16 g):  3 or 4 glucose tablets (best taken  with water), 15 Dextrose Bits chewable ? third choice (15-20 g):   cup fruit juice,  cup regular soda, 1 cup skim milk,  1 cup sports drink ? fourth choice (15-20 g):   1 small tube [place] gel (not frosting), 2 tbsp raisins, 1 tbsp table sugar,  candy, jelly beans, gum drops - check package for carb amount   (adapted from: Juluis RainierMcCall A.L. "Insulin therapy and hypoglycemia" Endocrinol Metab Clin N Am 2012, 41: 57-87)

## 2016-09-07 ENCOUNTER — Encounter: Payer: Self-pay | Admitting: Emergency Medicine

## 2016-09-07 ENCOUNTER — Emergency Department
Admission: EM | Admit: 2016-09-07 | Discharge: 2016-09-07 | Disposition: A | Discharge status: Home / Self Care | Payer: 59 | Attending: Emergency Medicine | Admitting: Emergency Medicine

## 2016-09-07 DIAGNOSIS — L245 Irritant contact dermatitis due to other chemical products: Secondary | ICD-10-CM

## 2016-09-07 DIAGNOSIS — I10 Essential (primary) hypertension: Secondary | ICD-10-CM | POA: Diagnosis not present

## 2016-09-07 DIAGNOSIS — L235 Allergic contact dermatitis due to other chemical products: Secondary | ICD-10-CM | POA: Diagnosis not present

## 2016-09-07 DIAGNOSIS — T7840XA Allergy, unspecified, initial encounter: Secondary | ICD-10-CM | POA: Diagnosis present

## 2016-09-07 DIAGNOSIS — Z79899 Other long term (current) drug therapy: Secondary | ICD-10-CM | POA: Diagnosis not present

## 2016-09-07 DIAGNOSIS — E109 Type 1 diabetes mellitus without complications: Secondary | ICD-10-CM | POA: Diagnosis not present

## 2016-09-07 DIAGNOSIS — E039 Hypothyroidism, unspecified: Secondary | ICD-10-CM | POA: Insufficient documentation

## 2016-09-07 DIAGNOSIS — F17210 Nicotine dependence, cigarettes, uncomplicated: Secondary | ICD-10-CM | POA: Diagnosis not present

## 2016-09-07 LAB — GLUCOSE, CAPILLARY: Glucose-Capillary: 385 mg/dL — ABNORMAL HIGH (ref 65–99)

## 2016-09-07 MED ORDER — FAMOTIDINE 20 MG PO TABS
40.0000 mg | ORAL_TABLET | Freq: Once | ORAL | Status: AC
  Administered 2016-09-07: 40 mg via ORAL
  Filled 2016-09-07: qty 2

## 2016-09-07 MED ORDER — CYPROHEPTADINE HCL 4 MG PO TABS
8.0000 mg | ORAL_TABLET | Freq: Once | ORAL | Status: AC
  Administered 2016-09-07: 8 mg via ORAL
  Filled 2016-09-07: qty 2

## 2016-09-07 MED ORDER — CYPROHEPTADINE HCL 4 MG PO TABS: 4.0000 mg | ORAL_TABLET | Freq: Three times a day (TID) | ORAL | 0 refills | Status: DC | PRN

## 2016-09-07 MED ORDER — DEXAMETHASONE SODIUM PHOSPHATE 10 MG/ML IJ SOLN
10.0000 mg | Freq: Once | INTRAMUSCULAR | Status: AC
  Administered 2016-09-07: 10 mg via INTRAMUSCULAR
  Filled 2016-09-07: qty 3

## 2016-09-07 MED ORDER — RANITIDINE HCL 150 MG PO TABS: 150.0000 mg | ORAL_TABLET | Freq: Two times a day (BID) | ORAL | 0 refills | Status: DC

## 2016-09-07 NOTE — ED Triage Notes (Signed)
Pt states "i'm having an allergic reaction". Pt states has recently switched laundry detergent. Pt states hands itch and arms itch. Pt with swelling noted to right periorbital area, no resp distress noted, no vomiting noted. Pt appears in no acute distress in triage.

## 2016-09-07 NOTE — ED Notes (Signed)
Patient states that he developed a rash to bilateral hands, arms and swelling to his lips last night. Patient states that it has become worse throughout the day. Patient in no respiratory distress.

## 2016-09-07 NOTE — ED Triage Notes (Signed)
Pt took 50mg  of benadryl at 1700.

## 2016-09-07 NOTE — Discharge Instructions (Signed)
You have an allergic contact dermatitis, likely due to the use of as ice melting product, without gloves. Take the antihistamines as directed. Continue to monitor symptoms. Monitor your blood sugars and adjust insulin as necessary. Follow-up with your provider for continued symptoms. Return to the ED for worsening symptoms as discussed.

## 2016-09-07 NOTE — ED Provider Notes (Signed)
[facility] Emergency Department Provider Note ____________________________________________  Time seen: 1926  I have reviewed the triage vital signs and the nursing notes.  HISTORY  Chief Complaint  Allergic Reaction  HPI Willie Spencer is a 50 y.o. male presents to the ED for evaluation of a presumed allergic reaction to an unknown cause. He is convinced it is due to a change in laundry detergent. He later admits to using his bare hands at work on Friday to spread an ice melt product at work. He denies any respiratory symptoms, mouth or lip swelling.   Past Medical History:  Diagnosis Date  . Diabetes mellitus without complication (HCC)   . Hypertension   . Thyroid disease     Patient Active Problem List   Diagnosis Date Noted  . HTN (hypertension) 01/29/2016  . Obesity 10/24/2015  . Chronic pain in right shoulder 07/26/2015  . Type 1 diabetes mellitus with hyperglycemia (HCC) 07/09/2015  . Postablative hypothyroidism 07/09/2015  . Smoker 07/09/2015    History reviewed. No pertinent surgical history.  Prior to Admission medications   Medication Sig Start Date End Date Taking? Authorizing Provider  cyproheptadine (PERIACTIN) 4 MG tablet Take 1 tablet (4 mg total) by mouth 3 (three) times daily as needed for allergies. 09/07/16   Mckenzie Toruno V Spencer Miosotis Wetsel, PA-C  Insulin Glargine (BASAGLAR KWIKPEN) 100 UNIT/ML SOPN INJECT 0.4 MLS (40 UNITS TOTAL) INTO THESKIN AT BEDTIME 05/30/16   Avanell ShackletonVickie L Henson, NP  insulin lispro (HUMALOG KWIKPEN) 100 UNIT/ML KiwkPen INJECT 10-15 UNITS 3 TIMES DAILY BEFORE MEALS 08/06/16   Avanell ShackletonVickie L Henson, NP  Insulin Pen Needle (PEN NEEDLES) 31G X 6 MM MISC Use with insulin pen 08/06/16   Avanell ShackletonVickie L Henson, NP  levothyroxine (SYNTHROID, LEVOTHROID) 200 MCG tablet TAKE ONE TABLET BY MOUTH EVERY MORNING BEFORE BREAKFAST. 07/25/16   Avanell ShackletonVickie L Henson, NP  losartan (COZAAR) 25 MG tablet TAKE 1 TABLET BY MOUTH DAILY 09/01/16   Avanell ShackletonVickie L Henson, NP   ranitidine (ZANTAC) 150 MG tablet Take 1 tablet (150 mg total) by mouth 2 (two) times daily. 09/07/16   Neymar Dowe V Spencer Tamira Ryland, PA-C    Allergies Wellbutrin [bupropion] and Levemir [insulin detemir]  Family History  Problem Relation Age of Onset  . Diabetes Paternal Grandfather   . Stroke Paternal Grandfather   . Heart disease Paternal Grandfather     Social History Social History  Substance Use Topics  . Smoking status: Current Every Day Smoker    Packs/day: 1.00    Types: Cigarettes    Last attempt to quit: 08/30/2015  . Smokeless tobacco: Never Used     Comment: uses ecigs  . Alcohol use 0.0 oz/week     Comment: socially, occasionally heavier on weekends    Review of Systems  Constitutional: Negative for fever. Eyes: Negative for visual changes. ENT: Negative for sore throat. Cardiovascular: Negative for chest pain. Respiratory: Negative for shortness of breath. Gastrointestinal: Negative for abdominal pain, vomiting and diarrhea. Skin: Positive for rash, swelling, and itching to the hands and forearms.  Neurological: Negative for headaches, focal weakness or numbness. ____________________________________________  PHYSICAL EXAM:  VITAL SIGNS: ED Triage Vitals [09/07/16 1918]  Enc Vitals Group     BP (!) 151/80     Pulse Rate 80     Resp 16     Temp 98.2 F (36.8 C)     Temp Source Oral     SpO2 100 %     Weight 250 lb (113.4 kg)  Height 6\' 3"  (1.905 m)     Head Circumference      Peak Flow      Pain Score      Pain Loc      Pain Edu?      Excl. in GC?    Constitutional: Alert and oriented. Well appearing and in no distress. Head: Normocephalic and atraumatic. Eyes: Conjunctivae are normal. PERRL. Normal extraocular movements Ears: Canals clear. TMs intact bilaterally. Nose: No congestion/rhinorrhea/epistaxis. Mouth/Throat: Mucous membranes are moist. Uvula is midline and tonsils are flat. No oropharyngeal edema or erythema is noted. Neck:  Supple. No thyromegaly. Hematological/Lymphatic/Immunological: No cervical lymphadenopathy. Cardiovascular: Normal rate, regular rhythm. Normal distal pulses. Respiratory: Normal respiratory effort. No wheezes/rales/rhonchi. Musculoskeletal: Nontender with normal range of motion in all extremities.  Neurologic:  Normal gait without ataxia. Normal speech and language. No gross focal neurologic deficits are appreciated. Skin:  Skin is warm, dry and intact. Patient is noted to have irritation and erythema to the bilateral palms and dorsal hands. There also appears to be some irritation to scattered lesions across the forearm. Patient's face also shows some irritation, erythema and redness. No blisters, pointing lesions or blisters are appreciated.. ____________________________________________  Labs Reviewed  GLUCOSE, CAPILLARY - Abnormal; Notable for the following:       Result Value   Glucose-Capillary 385 (*)    All other components within normal limits  CBG MONITORING, ED   Patient dosed his insulin in the ED.  _____________________________________________  PROCEDURES  Famotidine 40 mg PO Periactin 8 mg PO Decadron 10 mg IM ____________________________________________  INITIAL IMPRESSION / ASSESSMENT AND PLAN / ED COURSE  Patient with a acute allergic reaction after contact with an irritant. He reports improvement of his symptoms following administration of PO meds and IM Decadron. He will closely monitor his blood sugars following steroid administration. He will adjust his insulin as necessary. He should return to the ED for acutely worsening symptoms as discussed.  Clinical Course    ____________________________________________  FINAL CLINICAL IMPRESSION(S) / ED DIAGNOSES  Final diagnoses:  Allergic reaction, initial encounter  Irritant contact dermatitis due to other chemical products      Willie Spencer Jessup Ogas, PA-C 09/07/16 2233    Jeanmarie PlantJames A McShane, MD 09/08/16  [phone]

## 2016-09-16 ENCOUNTER — Encounter: Payer: 59 | Attending: Internal Medicine | Admitting: Nutrition

## 2016-09-16 DIAGNOSIS — E1065 Type 1 diabetes mellitus with hyperglycemia: Secondary | ICD-10-CM

## 2016-09-16 NOTE — Progress Notes (Signed)
Patient is here with his wife to learn about the OmniPod pump.   We discussed pump therapy, and he was shown how to fill a pod and give a bolus.  He re demonstrated this, and filled his pod with Novolog insulin, and applied it to his r upper outer arm without any difficulty.  He took his long acting insulin last night at 6:30PM.  We put his pump in a 100% basal reduction until 4:30 today.   He put the settings into his pump:  Basal rates: 1.4, 3AM: 1.5,  7AM: 1.4.  I/C ratio: 4, lSF: 25, timing: 4 hours.  I explained what each of these were, and he had no questions. We reviewed the menues of the pump, how to stop a bolus, and what to do if the pod falls off. They had no final questions. I will call him tonight after supper, to see how he is doing.  He was told to call the office if blood sugars go over 250, or drop below 70.  He agreed to do this.

## 2016-09-17 ENCOUNTER — Other Ambulatory Visit: Payer: Self-pay

## 2016-09-17 ENCOUNTER — Encounter: Payer: 59 | Admitting: Nutrition

## 2016-09-17 DIAGNOSIS — E1065 Type 1 diabetes mellitus with hyperglycemia: Secondary | ICD-10-CM

## 2016-09-17 MED ORDER — GLUCOSE BLOOD VI STRP: ORAL_STRIP | 5 refills | Status: DC

## 2016-09-17 NOTE — Patient Instructions (Signed)
Continue to test blood sugars before, and 2hr. After all meals, bedtime and 3AM.

## 2016-09-17 NOTE — Patient Instructions (Signed)
Test blood sugars before meals, 2hr. After meals, bedtime and 3AM. Call if low blood sugars, or if blood sugars go over 250.

## 2016-09-17 NOTE — Progress Notes (Signed)
Pt. Reported no difficulty giving boluses yesterday and this AM. Blood sugar last night 6PM: 96,  6:45PM(acS): 101, 2hr. PcS: 206.  Pt said he did not count carbs correctly, and was off by 12 carbs.  He did a correction of 4units and blood sugar 1 hour later was 170 (9:45PM-HS).  Today: 3AM: 72 (had glass of milk), 6:20AM: 95, and acB at 8:30:111.   Basal rated was started at 4PM yesterday.  No changes made to pump settings.   We reviewed how/when to use temp basal rates, and extended boluses.  He re demonstrated how to do this correctly and had no questions.  We also reviewed sick day guidelines and High blood sugar protocols.  He was given handouts on these two topics, and also emergencies to supplies to carry with you.  He had no final questions. He signed off as understanding all topics and he will call me his blood sugar readings tomorrow for further evaluations.  He will call sooner if he drops low.

## 2016-09-24 ENCOUNTER — Encounter: Payer: 59 | Admitting: Nutrition

## 2016-09-24 ENCOUNTER — Telehealth: Payer: Self-pay | Admitting: Internal Medicine

## 2016-09-24 MED ORDER — INSULIN LISPRO 100 UNIT/ML ~~LOC~~ SOLN: SUBCUTANEOUS | 11 refills | Status: DC

## 2016-09-24 NOTE — Telephone Encounter (Signed)
Refill submitted. 

## 2016-09-24 NOTE — Telephone Encounter (Signed)
humalog vial needs to be called into [facility] please

## 2016-10-02 ENCOUNTER — Other Ambulatory Visit: Payer: Self-pay | Admitting: Family Medicine

## 2016-10-07 ENCOUNTER — Telehealth: Payer: Self-pay | Admitting: Nutrition

## 2016-10-07 NOTE — Telephone Encounter (Signed)
Message left on answering machine to call me to see how he is doing on his pump, and if he is having a problems.

## 2016-10-17 ENCOUNTER — Telehealth: Payer: Self-pay

## 2016-10-17 ENCOUNTER — Other Ambulatory Visit: Payer: Self-pay

## 2016-10-17 ENCOUNTER — Telehealth: Payer: Self-pay | Admitting: Internal Medicine

## 2016-10-17 MED ORDER — GLUCOSE BLOOD VI STRP: ORAL_STRIP | 5 refills | Status: DC

## 2016-10-17 NOTE — Telephone Encounter (Signed)
Please ask pt to call the supplier and have them send the refill request directly. E.g. Of suppliers: [employer], [employer], etc.

## 2016-10-17 NOTE — Telephone Encounter (Signed)
Called and left message for patient regarding the PA for Humalog, advised we were beginning that, I also advised I had sent in strips for patient and that he needed to contact a supplier like [employer] or [employer] and tell them what he was needing so they could send us a request form for his pod. Gave call back number if any questions.

## 2016-10-17 NOTE — Telephone Encounter (Signed)
Also need to know which insurance company patient is using now, as I can not get the ID for his old one to come up to begin PA for humalog. Attempted to call, will await call back or will try again.

## 2016-10-17 NOTE — Telephone Encounter (Signed)
need PA on insulin lispro (HUMALOG) 100 UNIT/ML injection  glucose blood (FREESTYLE LITE) test strip  Need omni pods 4 boxes of five is what they get. But do not know where to order.

## 2016-10-20 NOTE — Telephone Encounter (Signed)
Message left on home phone to call either [employer] (number on back of PDM, or [employer]:  [phone].

## 2016-10-20 NOTE — Telephone Encounter (Signed)
Se previous telephone encounter.

## 2016-10-23 ENCOUNTER — Telehealth: Payer: Self-pay

## 2016-10-23 ENCOUNTER — Other Ambulatory Visit: Payer: Self-pay

## 2016-10-23 MED ORDER — BAYER MICROLET LANCETS MISC: 5 refills | Status: DC

## 2016-10-23 MED ORDER — GLUCOSE BLOOD VI STRP: ORAL_STRIP | 5 refills | Status: DC

## 2016-10-23 MED ORDER — BAYER CONTOUR NEXT MONITOR W/DEVICE KIT: PACK | 0 refills | Status: DC

## 2016-10-23 NOTE — Telephone Encounter (Signed)
Called patient and notified of sample vial of humalog at the office that he could come pick up until we got his insurance information straightened out. Advised patient to call back to let me know if this works with him.

## 2016-10-23 NOTE — Telephone Encounter (Signed)
Insurance has been updated, please submit for urgent PA for the humalog, also needs PA for the test strips for the freestyle ultra lite meter

## 2016-10-23 NOTE — Telephone Encounter (Signed)
Called patient and advised I had submitted the PA for Humalog and will await for response. I also advised of changing the meter to what his insurance will cover. Gave call back number if any questions.

## 2016-10-23 NOTE — Telephone Encounter (Signed)
Called patient and advised I had submitted the PA for Humalog and will await for response. I also advised of changing the meter to what his insurance will cover. Gave call back number if any questions.  

## 2016-10-27 ENCOUNTER — Encounter: Payer: Self-pay | Admitting: Internal Medicine

## 2016-10-27 ENCOUNTER — Ambulatory Visit (INDEPENDENT_AMBULATORY_CARE_PROVIDER_SITE_OTHER): Payer: BLUE CROSS/BLUE SHIELD | Admitting: Internal Medicine

## 2016-10-27 VITALS — BP 130/84 | HR 64 | Ht 74.0 in | Wt 249.0 lb

## 2016-10-27 DIAGNOSIS — E890 Postprocedural hypothyroidism: Secondary | ICD-10-CM

## 2016-10-27 DIAGNOSIS — E1065 Type 1 diabetes mellitus with hyperglycemia: Secondary | ICD-10-CM | POA: Diagnosis not present

## 2016-10-27 LAB — T4, FREE: Free T4: 0.42 ng/dL — ABNORMAL LOW (ref 0.60–1.60)

## 2016-10-27 LAB — TSH: TSH: 25.34 u[IU]/mL — ABNORMAL HIGH (ref 0.35–4.50)

## 2016-10-27 LAB — POCT GLYCOSYLATED HEMOGLOBIN (HGB A1C): HEMOGLOBIN A1C: 7.2

## 2016-10-27 MED ORDER — INSULIN ASPART 100 UNIT/ML ~~LOC~~ SOLN: SUBCUTANEOUS | 11 refills | Status: DC

## 2016-10-27 MED ORDER — LEVOTHYROXINE SODIUM 150 MCG PO TABS: 150.0000 ug | ORAL_TABLET | Freq: Every day | ORAL | 1 refills | Status: DC

## 2016-10-27 NOTE — Progress Notes (Signed)
Patient ID: Willie Spencer, male   DOB: 01/04/1966, 51 y.o.   MRN: [id number]   HPI: Willie Spencer is a 51 y.o.-year-old male, referred by his PCP, Harland Dingwall, NP, for management of DM1, dx 1987, uncontrolled, without complications and post ablative hypothyroidism. He saw Dr. Gabriel Carina in the past, last visit 2015.  He started on an insulin pump >> Omnipod (09/16/2016). He loves it and feels that his sugars are better after he started. However, he is having problems obtaining Humalog, which was denied by his insurance. He is not opposed to starting NovoLog.  DM1: Last hemoglobin A1c was: Lab Results  Component Value Date   HGBA1C 7.6% 07/30/2016   HGBA1C 7.4% 01/29/2016   HGBA1C 8.1% 10/24/2015   He was on a regimen of: - Lantus 40 units at dinnertime - Humalog 10-15 units 3x a day - AC  He is now on the insulin pump with the following settings: - basal rates: 12 am: 1.4 units/h 3 am: 1.5 7 am: 1.4 TDD from basal insulin: 49% - ICR: 4 - target: 100-100 - ISF: 35 - Insulin on Board: 4h - bolus wizard: on TDD from bolus insulin: 51% - extended bolusing: not using - changes infusion site: q3 days  Pt checks his sugars 2-4 a day and they are: - am: 120-180 >> 158-238, 315 - 2h after b'fast: n/c >> 114-272 - before lunch: n/c >> 61-203, 264 - 2h after lunch: n/c >> 139 - before dinner: n/c >> 66, 84-228 - 2h after dinner: 90-120 (active at work) >> 220, 271 - bedtime: 120, 160s - nighttime: n/c Lowest sugar was 30s (wife called EMS - ~2013) >> 61; he has hypoglycemia awareness at 70. No previous hypoglycemia admission. Does have a glucagon kit at home. Highest sugar was 400s- seldom >> 315. No previous DKA admissions.    Pt's meals are: - Breakfast: sausage biscuits, orange crackers - Lunch: sandwich; fast food - Dinner: meat + veggies + starch - Snacks: at bedtime; 3: fruit, PB crackers, nuts  - no CKD, last BUN/creatinine:  Lab Results  Component Value Date   BUN 12  07/30/2016   BUN 11 01/29/2016   CREATININE 0.84 07/30/2016   CREATININE 0.97 01/29/2016  On Losartan. - last set of lipids: Lab Results  Component Value Date   CHOL 165 07/30/2015   HDL 37 (L) 07/30/2015   LDLCALC 104 07/30/2015   TRIG 120 07/30/2015   CHOLHDL 4.5 07/30/2015   - last eye exam was in 2015. No DR.  - no numbness and tingling in his feet.  He also has post ablative hypothyroidism: - history of Graves' disease, s/p RAI treatment 08/20/2012.  He has had fluctuating TSH levels in the past: Lab Results  Component Value Date   TSH 8.46 (H) 07/30/2016   TSH 61.14 (H) 01/29/2016   TSH 1.697 07/30/2015   FREET4 1.2 07/30/2016   At last visit, he was on levothyroxine 200 mcg daily. He was taking the levothyroxine incorrectly, so I advised him to move his multivitamins and coffee + creamer later and decrease the dose to 175 g daily. He did not get this new dose, but started to take his levothyroxine 200 g every other day (average 100 g daily)! - Fasting - With water - Separated by >30 minutes from breakfast - No calcium, iron, PPI - + multivitamins (!) >> Moved these 4 hours later   - + coffee + creamer with LT4 (!) >> Moved this 30 minutes  later  ROS: Constitutional: no weight gain, no fatigue, no subjective hyperthermia/hypothermia Eyes: no blurry vision, no xerophthalmia ENT: no sore throat, no nodules palpated in throat, no dysphagia/odynophagia, no hoarseness Cardiovascular: no CP/SOB/palpitations/leg swelling Respiratory: no cough/SOB Gastrointestinal: no N/V/D/C/+ heartburn Musculoskeletal: no muscle/joint aches Skin: no rashes Neurological: no tremors/numbness/tingling/dizziness Psychiatric: no depression/anxiety  Past Medical History:  Diagnosis Date  . Diabetes mellitus without complication ([place])   . Hypertension   . Thyroid disease    No past surgical history on file. Social History   Social History  . Marital status: Married    Spouse  name: N/A  . Number of children: 2   Occupational History  .  Maintenance supervisor   Social History Main Topics  . Smoking status: Current Every Day Smoker    Packs/day: 1.00    Types: Cigarettes    Last attempt to quit: 08/30/2015  . Smokeless tobacco: Never Used     Comment: uses ecigs  . Alcohol use      Comment: 6 beers per weekend  . Drug use: No   Current Outpatient Prescriptions on File Prior to Visit  Medication Sig Dispense Refill  . BAYER MICROLET LANCETS lancets Use as instructed to check sugar 7 times daily. 400 each 5  . Blood Glucose Monitoring Suppl (BAYER CONTOUR NEXT MONITOR) w/Device KIT Used to check sugar 7 times daily. 1 kit 0  . cyproheptadine (PERIACTIN) 4 MG tablet Take 1 tablet (4 mg total) by mouth 3 (three) times daily as needed for allergies. 30 tablet 0  . glucose blood (BAYER CONTOUR NEXT TEST) test strip Use as instructed to check sugar 7 times daily. 400 each 5  . Insulin Glargine (BASAGLAR KWIKPEN) 100 UNIT/ML SOPN INJECT 0.4 MLS (40 UNITS TOTAL) INTO THESKIN AT BEDTIME 15 mL 1  . insulin lispro (HUMALOG) 100 UNIT/ML injection Use 45 units max per day per insulin pump. 20 mL 11  . Insulin Pen Needle (PEN NEEDLES) 31G X 6 MM MISC Use with insulin pen 100 each 1  . levothyroxine (SYNTHROID, LEVOTHROID) 200 MCG tablet TAKE ONE TABLET BY MOUTH EVERY MORNING BEFORE BREAKFAST. 30 tablet 2  . losartan (COZAAR) 25 MG tablet TAKE 1 TABLET BY MOUTH DAILY 30 tablet 1  . ranitidine (ZANTAC) 150 MG tablet Take 1 tablet (150 mg total) by mouth 2 (two) times daily. 20 tablet 0   No current facility-administered medications on file prior to visit.    Allergies  Allergen Reactions  . Wellbutrin [Bupropion]     Gum swelling  . Levemir [Insulin Detemir] Rash   Family History  Problem Relation Age of Onset  . Diabetes Paternal Grandfather   . Stroke Paternal Grandfather   . Heart disease Paternal Grandfather     PE: BP 130/84 (BP Location: Left Arm, Patient  Position: Sitting)   Pulse 64   Ht 6' 2"  (1.88 m)   Wt 249 lb (112.9 kg)   SpO2 96%   BMI 31.97 kg/m  Wt Readings from Last 3 Encounters:  10/27/16 249 lb (112.9 kg)  09/07/16 250 lb (113.4 kg)  09/05/16 250 lb (113.4 kg)   Constitutional: overweight, in NAD Eyes: PERRLA, EOMI, no exophthalmos ENT: moist mucous membranes, no thyromegaly, no cervical lymphadenopathy Cardiovascular: RRR, No MRG Respiratory: CTA B Gastrointestinal: abdomen soft, NT, ND, BS+ Musculoskeletal: no deformities, strength intact in all 4 Skin: moist, warm, no rashes Neurological: no tremor with outstretched hands, DTR normal in all 4  ASSESSMENT: 1. DM1, uncontrolled, without Long term  complications, but with hyperglycemia  2. Post-ablative hypothyroidism  PLAN:  1. Patient with long-standing, uncontrolled DM1, on Insulin pump now. He feels that his sugars have improved after switching to the insulin pump. However, his sugars in a.m. are still high, and also he has hyperglycemia at that time. He admits that he sometimes does not calculate the carbs correctly and sometimes enters less carbs then he should have with dinner, because of fear of becoming hypoglycemic overnight. We downloaded his pump and I showed him how the sugars increased after dinner and they stay high throughout the night including the next morning. He agrees to start putting the entire amount of carbs eaten in the pump. I think his insulin to carb ratio is good, at 4. We will not change this. However, I will advise him to increase his basal rate between 12 AM and 3 AM and also to decrease his basal rate midmorning, since he may have lows around that time due to increased activity at work. - We reviewed together his previous HbA1c of 7.6%. Today, this has improved to 7.2%. - He is interested in a CGM, but for now, he should get a little be more accustomed to his pump and that we can pursue this. - I sent a prescription for NovoLog to his  pharmacy. - We discussed about changes to his insulin regimen, as follows:  Patient Instructions  Please change the pump settings as follows: Pump settings: - basal rates: 12 am: 1.4 units/h >> 1.5 3 am: 1.5 7 am: 1.4 9 am: 1.3 1 pm: 1.4 - ICR: 4 - target: 100-100 - ISF: 35 - Insulin on Board: 4h  Please continue Levothyroxine 100 mcg daily.  Please stop at the lab.  Please come back for a follow-up appointment in 3 months  - advised for yearly eye exams - Return to clinic in 3 mo   2. Post-ablative hypothyroidism - Uncontrolled - At last visit, he was not taking his levothyroxine correctly, so advised him to move the multivitamins at least 4 hours later in the coffee with creamer at least 30 minutes late and to decrease his levothyroxine to 175 g daily. He did not get the new dose >> Now taking LT4 200 mcg every other day (total 100 g daily) - Again discussed about the importance of taking the thyroid hormone every day, with water, at least 30 minutes before breakfast, separated by at least 4 hours from: - acid reflux medications - calcium - iron - multivitamins - Will recheck TFTs today, but I believe we need to increase his levothyroxine dose to 175 g daily.  Office Visit on 10/27/2016  Component Date Value Ref Range Status  . TSH 10/27/2016 25.34* 0.35 - 4.50 uIU/mL Final  . Free T4 10/27/2016 0.42* 0.60 - 1.60 ng/dL Final   Comment: Specimens from patients who are undergoing biotin therapy and /or ingesting biotin supplements may contain high levels of biotin.  The higher biotin concentration in these specimens interferes with this Free T4 assay.  Specimens that contain high levels  of biotin may cause false high results for this Free T4 assay.  Please interpret results in light of the total clinical presentation of the patient.    . Hemoglobin A1C 10/27/2016 7.2   Final   TSH is high >> We'll increase his levothyroxine to 150 g daily. We'll need to come back for  labs in 1.5 months.   - time spent with the patient and his wife: 40 min, of  which >50% was spent in reviewing his pump downloads (I will scan the reports), discussing his hypo- and hyper-glycemic episodes, reviewing previous labs and pump settings and developing a plan to avoid hypo- and hyper-glycemia. We also addressed his hypothyroidism today.   Philemon Kingdom, MD PhD [facility] Endocrinology

## 2016-10-27 NOTE — Patient Instructions (Addendum)
Please change the pump settings as follows: Pump settings: - basal rates: 12 am: 1.4 units/h >> 1.5 3 am: 1.5 7 am: 1.4 9 am: 1.3 1 pm: 1.4 - ICR: 4 - target: 100-100 - ISF: 35 - Insulin on Board: 4h  Please continue Levothyroxine 100 mcg daily.  Please stop at the lab.  Please come back for a follow-up appointment in 3 months

## 2016-10-29 ENCOUNTER — Telehealth: Payer: Self-pay

## 2016-10-29 NOTE — Telephone Encounter (Signed)
Called and advised patient of lab results, and medications that were sent in. Patient also advised we were waiting for [employer] to send me the form to fill out for his omnipod. Asked Bonita QuinLinda if she had some to use, she had one. I advised him we had one and it was up front for his wife to pick up. No other questions at this time.

## 2016-10-29 NOTE — Telephone Encounter (Signed)
Message left on machine to call me to let me know how he is doing on his new pump

## 2016-10-29 NOTE — Telephone Encounter (Signed)
-----   Message from Carlus Pavlovristina Gherghe, MD sent at 10/27/2016  6:54 PM EST ----- Raynelle FanningJulie, can you please call pt (he is trying to figure out how to get back into my chart): His TSH is still high, now at 25. We'll need to increase his levothyroxine to 150 g daily. I sent this dose to his pharmacy. We need labs in 1.5 months. Labs are in.

## 2016-11-15 ENCOUNTER — Other Ambulatory Visit: Payer: Self-pay | Admitting: Family Medicine

## 2016-11-28 NOTE — Telephone Encounter (Signed)
Opened in error

## 2016-12-09 ENCOUNTER — Other Ambulatory Visit: Payer: BLUE CROSS/BLUE SHIELD

## 2016-12-29 ENCOUNTER — Other Ambulatory Visit: Payer: Self-pay | Admitting: Family Medicine

## 2017-01-02 ENCOUNTER — Other Ambulatory Visit: Payer: BLUE CROSS/BLUE SHIELD

## 2017-01-23 ENCOUNTER — Ambulatory Visit: Payer: BLUE CROSS/BLUE SHIELD | Admitting: Internal Medicine

## 2017-01-26 ENCOUNTER — Other Ambulatory Visit: Payer: Self-pay | Admitting: Internal Medicine

## 2017-02-03 ENCOUNTER — Other Ambulatory Visit: Payer: Self-pay | Admitting: Family Medicine

## 2017-02-10 ENCOUNTER — Telehealth: Payer: Self-pay | Admitting: Nutrition

## 2017-02-10 NOTE — Telephone Encounter (Signed)
Message left on voice mail to make an appt. With me to finish training on OmniPod.  Told him with need to do this ASAP.

## 2017-02-18 ENCOUNTER — Telehealth: Payer: Self-pay | Admitting: Nutrition

## 2017-02-18 NOTE — Telephone Encounter (Signed)
Handouts sent to him by mail, on High blood sugar protocol, and sick days, as well as alternate sites to insert pods, and a note sent to read over and call if questions. When he was here for last visit with me, he did not take the handouts, and did not return multiple calls to schedule last pump training visit.

## 2017-03-10 ENCOUNTER — Encounter: Payer: Self-pay | Admitting: Internal Medicine

## 2017-03-10 ENCOUNTER — Ambulatory Visit (INDEPENDENT_AMBULATORY_CARE_PROVIDER_SITE_OTHER): Payer: Managed Care, Other (non HMO) | Admitting: Internal Medicine

## 2017-03-10 VITALS — BP 132/84 | HR 71 | Ht 75.0 in | Wt 239.0 lb

## 2017-03-10 DIAGNOSIS — E890 Postprocedural hypothyroidism: Secondary | ICD-10-CM

## 2017-03-10 DIAGNOSIS — E1065 Type 1 diabetes mellitus with hyperglycemia: Secondary | ICD-10-CM

## 2017-03-10 LAB — LIPID PANEL
CHOL/HDL RATIO: 5
Cholesterol: 173 mg/dL (ref 0–200)
HDL: 37.4 mg/dL — ABNORMAL LOW (ref >39.00)
LDL CALC: 113 mg/dL — AB (ref 0–99)
NonHDL: 135.21
Triglycerides: 109 mg/dL (ref 0.0–149.0)
VLDL: 21.8 mg/dL (ref 0.0–40.0)

## 2017-03-10 LAB — POCT GLYCOSYLATED HEMOGLOBIN (HGB A1C): HEMOGLOBIN A1C: 7.4

## 2017-03-10 LAB — TSH: TSH: 7.52 u[IU]/mL — AB (ref 0.35–4.50)

## 2017-03-10 LAB — T4, FREE: Free T4: 0.97 ng/dL (ref 0.60–1.60)

## 2017-03-10 MED ORDER — INSULIN LISPRO 100 UNIT/ML ~~LOC~~ SOLN: SUBCUTANEOUS | 3 refills | Status: DC

## 2017-03-10 MED ORDER — LEVOTHYROXINE SODIUM 175 MCG PO TABS
175.0000 ug | ORAL_TABLET | Freq: Every day | ORAL | 1 refills | Status: DC
Start: 2017-03-10 — End: 2018-01-20

## 2017-03-10 MED ORDER — INSULIN LISPRO 100 UNIT/ML (KWIKPEN): 10.0000 [IU] | PEN_INJECTOR | Freq: Three times a day (TID) | SUBCUTANEOUS | 11 refills | Status: DC

## 2017-03-10 NOTE — Patient Instructions (Addendum)
Please change the pump settings as follows: Pump settings: - basal rates: 12 am: 1.4 units/h >> 1.5 3 am: 1.5 5 am: 1.4 >> 1.6 9 am: 1.3 1 pm: 1.4 - ICR: 4 - target: 100-100 - ISF: 35 - Insulin on Board: 4h  Always enter carbs when you bolus for a meal!  Please continue Levothyroxine 150 mcg daily.  Please stop at the lab.  Please come back for a follow-up appointment in 3 months.

## 2017-03-10 NOTE — Progress Notes (Signed)
Patient ID: Willie Spencer, male   DOB: February 28, 1966, 51 y.o.   MRN: [id number]   HPI: Willie Spencer is a 51 y.o.-year-old male, returning for f/u for DM1, dx 1987, uncontrolled, without complications and post ablative hypothyroidism. He saw Dr. Gabriel Carina in the past, last visit 51 Last visit with me: 4.5 mo ago.  He started on an insulin pump >> Omnipod (09/16/2016). He loves it and feels that his sugars are better after he started. On Humalog.  DM1: Last hemoglobin A1c was: Lab Results  Component Value Date   HGBA1C 7.2 10/27/2016   HGBA1C 7.6% 07/30/2016   HGBA1C 7.4% 01/29/2016   He was on a regimen of: - Lantus 40 units at dinnertime - Humalog 10-15 units 3x a day - AC  Now on: (He did not make any of the changes suggested at last visit...) - basal rates: 12 am: 1.5 (did not make the change!) 3 am: 1.5 7 am: 1.4 9 am: 1.3 >> 1.4 (did not make the change!) 1 pm: 1.4 - ICR: 4 - target: 100-100 - ISF: 35 - Insulin on Board: 4h TDD from basal insulin: 49% >> 53% TDD from bolus insulin: 51%>> 47%  Total daily dose 60.7 units - extended bolusing: not using - changes infusion site: q3 days  Pt checks his sugars 3.9x a day and they are: - am: 120-180 >> 158-238, 315 >> 66 x1, 155-273 - 2h after b'fast: n/c >> 114-272 >> 37, 102-179, 238 - before lunch: n/c >> 61-203, 264 >> 40, 62-124 - 2h after lunch: n/c >> 139 >> 63-135 - before dinner: n/c >> 66, 84-228 >> 51-192 - 2h after dinner: 90-120 (active at work) >> 220, 271 >> 63-222 - bedtime: 120, 160s >> see above, + 330 x1 - nighttime: n/c Lowest sugar was 30s (wife called EMS - ~49) >> 61 >> 37 (after a manual bolus); he has hypoglycemia awareness at 70. No previous hypoglycemia admission. Does have a glucagon pen at home. Highest sugar was 400s- seldom >> 315 >> 330. No previous DKA admissions.    Pt's meals are: - Breakfast: sausage biscuits, orange crackers - Lunch: sandwich; fast food - Dinner: meat + veggies +  starch - Snacks: at bedtime; 3: fruit, PB crackers, nuts  - no CKD, last BUN/creatinine:  Lab Results  Component Value Date   BUN 12 07/30/2016   BUN 11 01/29/2016   CREATININE 0.84 07/30/2016   CREATININE 0.97 01/29/2016  On Losartan.. - last set of lipids: Lab Results  Component Value Date   CHOL 165 07/30/2015   HDL 37 (L) 07/30/2015   LDLCALC 104 07/30/2015   TRIG 120 07/30/2015   CHOLHDL 4.5 07/30/2015   - last eye exam was in 2015. No DR. He has an appt coming up this week. - he denies numbness and tingling in his feet.  He also has post ablative hypothyroidism: - history of Graves' disease, s/p RAI treatment 07/2012  He had fluctuating TSH levels in the past:  Lab Results  Component Value Date   TSH 25.34 (H) 10/27/2016   TSH 8.46 (H) 07/30/2016   TSH 61.14 (H) 01/29/2016   TSH 1.697 07/30/2015   FREET4 0.42 (L) 10/27/2016   FREET4 1.2 07/30/2016   At last visit, we increased his levothyroxine dose from 100 to 150 g.  He takes this: - in am - fasting - at least 30 min from b'fast - no Ca, Fe, PPIs - + MVI >4h later - not on  Biotin  Pt denies: - feeling nodules in neck - hoarseness - dysphagia - choking - SOB with lying down  ROS: Constitutional: + weight loss, no fatigue, no subjective hyperthermia, no subjective hypothermia Eyes: no blurry vision, no xerophthalmia ENT: no sore throat, no nodules palpated in throat, no dysphagia, no odynophagia, no hoarseness Cardiovascular: no CP/no SOB/no palpitations/no leg swelling Respiratory: no cough/no SOB/no wheezing Gastrointestinal: no N/no V/no D/no C/no acid reflux Musculoskeletal: no muscle aches/no joint aches Skin: no rashes, no hair loss Neurological: no tremors/no numbness/no tingling/no dizziness  I reviewed pt's medications, allergies, PMH, social hx, family hx, and changes were documented in the history of present illness. Otherwise, unchanged from my initial visit note.   Past Medical  History:  Diagnosis Date  . Diabetes mellitus without complication ([place])   . Hypertension   . Thyroid disease    No past surgical history on file. Social History   Social History  . Marital status: Married    Spouse name: N/A  . Number of children: 2   Occupational History  .  Maintenance supervisor   Social History Main Topics  . Smoking status: Current Every Day Smoker    Packs/day: 1.00    Types: Cigarettes    Last attempt to quit: 08/30/2015  . Smokeless tobacco: Never Used     Comment: uses ecigs  . Alcohol use      Comment: 6 beers per weekend  . Drug use: No   Current Outpatient Prescriptions on File Prior to Visit  Medication Sig Dispense Refill  . BAYER MICROLET LANCETS lancets Use as instructed to check sugar 7 times daily. 400 each 5  . Blood Glucose Monitoring Suppl (BAYER CONTOUR NEXT MONITOR) w/Device KIT Used to check sugar 7 times daily. 1 kit 0  . cyproheptadine (PERIACTIN) 4 MG tablet Take 1 tablet (4 mg total) by mouth 3 (three) times daily as needed for allergies. 30 tablet 0  . glucose blood (BAYER CONTOUR NEXT TEST) test strip Use as instructed to check sugar 7 times daily. 400 each 5  . insulin aspart (NOVOLOG) 100 UNIT/ML injection Use 70 units per day in the insulin pump 30 mL 11  . Insulin Glargine (BASAGLAR KWIKPEN) 100 UNIT/ML SOPN INJECT 0.4 MLS (40 UNITS TOTAL) INTO THESKIN AT BEDTIME 15 mL 1  . Insulin Pen Needle (PEN NEEDLES) 31G X 6 MM MISC Use with insulin pen 100 each 1  . levothyroxine (SYNTHROID, LEVOTHROID) 150 MCG tablet TAKE 1 TABLET BY MOUTH DAILY 45 tablet 1  . losartan (COZAAR) 25 MG tablet TAKE 1 TABLET BY MOUTH DAILY 30 tablet 2  . ranitidine (ZANTAC) 150 MG tablet Take 1 tablet (150 mg total) by mouth 2 (two) times daily. (Patient taking differently: Take 150 mg by mouth daily as needed. ) 20 tablet 0   No current facility-administered medications on file prior to visit.    Allergies  Allergen Reactions  . Wellbutrin  [Bupropion]     Gum swelling  . Levemir [Insulin Detemir] Rash   Family History  Problem Relation Age of Onset  . Diabetes Paternal Grandfather   . Stroke Paternal Grandfather   . Heart disease Paternal Grandfather     PE: BP 132/84 (BP Location: Left Arm, Patient Position: Sitting)   Pulse 71   Ht _0  (1.905 m)   Wt 239 lb (108.4 kg)   SpO2 97%   BMI 29.87 kg/m  Wt Readings from Last 3 Encounters:  03/10/17 239 lb (108.4 kg)  10/27/16 249 lb (112.9 kg)  09/07/16 250 lb (113.4 kg)   Constitutional: overweight, in NAD Eyes: PERRLA, EOMI, no exophthalmos ENT: moist mucous membranes, no thyromegaly, no cervical lymphadenopathy Cardiovascular: RRR, No MRG Respiratory: CTA B Gastrointestinal: abdomen soft, NT, ND, BS+ Musculoskeletal: no deformities, strength intact in all 4 Skin: moist, warm, no rashes Neurological: no tremor with outstretched hands, DTR normal in all 4  ASSESSMENT: 1. DM1, uncontrolled, without Long term complications, but with hyperglycemia  2. Post-ablative hypothyroidism  PLAN:  1. Patient with long-standing, uncontrolled DM1, On an Omnipod insulin pump. His sugars appear improved, with the exception of higher sugars in a.m. And low sugars in the early morning and around lunch time. Upon questioning and downloading his pump, he did not make any of the changes in his pump settings that I suggested last time I suggested to increase his basal rates at night and to decrease the ones midday. He is not sure why he did not change the settings. I suggested again to increase the basal rates at night and early morning and decrease the ones between 9 AM and 1 PM.  - We also discussed about the fact that he is not entering all his carbs in the pump and he may drop his sugars precipitously if he uses manual boluses. In one instance, he dropped to 37 in less than an hour after one such bolus. He agrees to start entering all carbs in the pump. - I do not feel that he  needs any other changes at this point - We discussed about changes to his insulin regimen, as follows:  Patient Instructions  Please change the pump settings as follows: Pump settings: - basal rates: 12 am: 1.4 units/h >> 1.5 3 am: 1.5 5 am: 1.4 >> 1.6 9 am: 1.3 1 pm: 1.4 - ICR: 4 - target: 100-100 - ISF: 35 - Insulin on Board: 4h  Please continue Levothyroxine 100 mcg daily.  Please stop at the lab.  Please come back for a follow-up appointment in 3 months  - today, HbA1c is 7.4% (higher) - continue checking sugars at different times of the day - check 4x a day, rotating checks - advised for yearly eye exams >> he is UTD - Return to clinic in 3 mo with sugar log    2. Post-ablative hypothyroidism - Uncontrolled - latest thyroid labs reviewed with pt >> TSH was high, therefore, we increase the levothyroxine to 150 g daily. - he continues on LT4 mcg daily - pt feels good on this dose. He lost 10 pounds since last visit.  - we discussed about taking the thyroid hormone every day, with water, >30 minutes before breakfast, separated by >4 hours from acid reflux medications, calcium, iron, multivitamins. Pt. is taking it correctly. - will check thyroid tests today: TSH and fT4 - If labs are abnormal, he will need to return for repeat TFTs in 1.5 months - OTW, Return in about 3 months (around 06/10/2017).  - time spent with the patient: 40 min, of which >50% was spent in reviewing his pump downloads, discussing his hypo- and hyper-glycemic episodes, reviewing previous labs and pump settings and developing a plan to avoid hypo- and hyper-glycemia. We also addressed his hypothyroidism.  Office Visit on 03/10/2017  Component Date Value Ref Range Status  . TSH 03/10/2017 7.52* 0.35 - 4.50 uIU/mL Final  . Free T4 03/10/2017 0.97  0.60 - 1.60 ng/dL Final   Comment: Specimens from patients who are undergoing biotin therapy  and /or ingesting biotin supplements may contain high levels of  biotin.  The higher biotin concentration in these specimens interferes with this Free T4 assay.  Specimens that contain high levels  of biotin may cause false high results for this Free T4 assay.  Please interpret results in light of the total clinical presentation of the patient.    . Cholesterol 03/10/2017 173  0 - 200 mg/dL Final   ATP III Classification       Desirable:  < 200 mg/dL               Borderline High:  200 - 239 mg/dL          High:  > = 240 mg/dL  . Triglycerides 03/10/2017 109.0  0.0 - 149.0 mg/dL Final   Normal:  <150 mg/dLBorderline High:  150 - 199 mg/dL  . HDL 03/10/2017 37.40* >39.00 mg/dL Final  . VLDL 03/10/2017 21.8  0.0 - 40.0 mg/dL Final  . LDL Cholesterol 03/10/2017 113* 0 - 99 mg/dL Final  . Total CHOL/HDL Ratio 03/10/2017 5   Final                  Men          Women1/2 Average Risk     3.4          3.3Average Risk          5.0          4.42X Average Risk          9.6          7.13X Average Risk          15.0          11.0                      . NonHDL 03/10/2017 135.21   Final   NOTE:  Non-HDL goal should be 30 mg/dL higher than patient's LDL goal (i.e. LDL goal of < 70 mg/dL, would have non-HDL goal of < 100 mg/dL)  . Hemoglobin A1C 03/10/2017 7.4   Final   TSH is still high >> increase LT4 to 175 mcg daily and will repeat labs at next visit. LDL higher >> will need to discuss about a statin at next visit.  Philemon Kingdom, MD PhD [facility]

## 2017-04-10 ENCOUNTER — Telehealth: Payer: Self-pay | Admitting: Internal Medicine

## 2017-04-10 NOTE — Telephone Encounter (Signed)
Called and notified wife.

## 2017-04-10 NOTE — Telephone Encounter (Signed)
Called Bonita QuinLinda regarding this patient, Left her a voicemail and advised if she had any pods in the office to give to the patient.

## 2017-04-10 NOTE — Telephone Encounter (Signed)
Patient has [employer] and the insurance will not cover the omnipods. [employer] has advised that [employer] [facility] will do it however, once patient's wife called they stated that it would be $1,067 and that is not an option financially for the patient. Patient only has 1 omnipod left and needs guidance on how to get the omnipods today. Call wife today at [phone].

## 2017-04-10 NOTE — Telephone Encounter (Signed)
Called to notify wife that the pods were up front to pick up with patients name and gave the patient the name to the rep to help.

## 2017-04-14 NOTE — Telephone Encounter (Signed)
Judeth CornfieldStephanie called to advise that omnipods are subject to medical deductible. They cannot afford this. She is asking if there is an alternative. If not, then patient will have to go back on humalog, lantus, and quickpens. Please advise stephanie at mobile #

## 2017-04-14 NOTE — Telephone Encounter (Signed)
Please see message below

## 2017-04-14 NOTE — Telephone Encounter (Signed)
I spoke with wife, and suggested she try a different pump, and maybe they can bill the monthly supplies under his prescriptions.  She agreed for me to try this with both the Medtronic pump and the [employer]andem pump.  He will still need insulin doses, because we will need time to get his new pump ordered.  She agreed to this. I will contact [employer] and [employer] to do insurance verification on this patient.

## 2017-04-14 NOTE — Telephone Encounter (Signed)
We can go back (provisionally) to: - Lantus 40 units at dinnertime - Humalog - try: Target: 120 ICR 1:4 ISF 40 for simplicity  Raynelle FanningJulie can you please send the following doses: - Lantus 40 units at dinnertime - Humalog 10-15 units 3x a day before meals To his pharmacy?  Thank you both! C

## 2017-04-15 ENCOUNTER — Other Ambulatory Visit: Payer: Self-pay

## 2017-04-15 DIAGNOSIS — E109 Type 1 diabetes mellitus without complications: Secondary | ICD-10-CM

## 2017-04-15 MED ORDER — INSULIN LISPRO 100 UNIT/ML (KWIKPEN): 10.0000 [IU] | PEN_INJECTOR | Freq: Three times a day (TID) | SUBCUTANEOUS | 11 refills | Status: DC

## 2017-04-15 MED ORDER — BASAGLAR KWIKPEN 100 UNIT/ML ~~LOC~~ SOPN: PEN_INJECTOR | SUBCUTANEOUS | 1 refills | Status: DC

## 2017-04-15 NOTE — Telephone Encounter (Signed)
Patient notified

## 2017-04-15 NOTE — Telephone Encounter (Signed)
Called patient's wife and advised of medication. She understood, and had no questions. Patient had no questions.

## 2017-04-15 NOTE — Telephone Encounter (Signed)
OmniPod rep. Came in today and said that he would check his insurance to see if they can file the pods under his pharmacy benefits.  He will contact the wife to discuss this, and to tell her about their indigent program.

## 2017-04-15 NOTE — Telephone Encounter (Signed)
Yes, just make sure he is aware of the new regimen.

## 2017-04-15 NOTE — Telephone Encounter (Signed)
There are Humalog pens, and basaglar pens on file already with the same doses, okay to send those in?

## 2017-04-21 ENCOUNTER — Other Ambulatory Visit: Payer: Self-pay | Admitting: Internal Medicine

## 2017-04-22 ENCOUNTER — Other Ambulatory Visit: Payer: Self-pay

## 2017-04-22 NOTE — Telephone Encounter (Signed)
Left linda a note on desk to look at this message.

## 2017-04-22 NOTE — Telephone Encounter (Signed)
Patient needs Ominipods sent to [facility]. He knows he has $1000 deductable for it.  Please advise.  Thank you,  -LL

## 2017-04-23 NOTE — Telephone Encounter (Signed)
Called and gave patient my work email that is secure for them to send me the form too, If I do not see any form I will fax the number given.

## 2017-04-23 NOTE — Telephone Encounter (Signed)
Patient would like a call back for an email to email the form to your office. Patient said you all can fax the script to them as well. FAX: [phone]  Thank you,  -LL

## 2017-04-23 NOTE — Telephone Encounter (Signed)
Called patient, he will have [employer] [facility] send us the form to fill out so we can fax it back.

## 2017-04-24 NOTE — Telephone Encounter (Signed)
Have not received the form from [employer] [facility] with ordering form, so I faxed them a cover sheet requesting they send it to me via fax.  Will await.

## 2017-04-29 ENCOUNTER — Other Ambulatory Visit: Payer: Self-pay

## 2017-04-29 MED ORDER — OMNIPOD MISC: 5 refills | Status: DC

## 2017-04-29 NOTE — Telephone Encounter (Signed)
Wife called telling her that I called [employer] and was told that we needed to fax a prescription for the OmniPods to them with a diagnosis code, and demographic sheet and also insurance card.  This was done with the help of Raynelle FanningJulie, and I will call back in 4 hours to make sure that it is being delt with.  Stressed the need to get this order out ASAP.  Told wife that I have left my last Pod up front for her to pick up for her husband, and the name of the rep. To call if he needs more.

## 2017-04-29 NOTE — Telephone Encounter (Signed)
Hey. Can you please look into this? I received an email from his wife stating that they still have not received any pods. I have not gotten and email with the form and I have faxed them to get the form. Patient is completely out of pods this morning and sugar was 456. Can you see if you have any pods? Thank you!

## 2017-06-16 ENCOUNTER — Ambulatory Visit: Payer: Managed Care, Other (non HMO) | Admitting: Internal Medicine

## 2017-06-25 ENCOUNTER — Other Ambulatory Visit: Payer: Self-pay | Admitting: Family Medicine

## 2017-06-25 NOTE — Telephone Encounter (Signed)
Called and left message for pt to call back and schedule as he is due for an appt in November. I will refill med until november

## 2017-08-26 ENCOUNTER — Ambulatory Visit (INDEPENDENT_AMBULATORY_CARE_PROVIDER_SITE_OTHER): Payer: Managed Care, Other (non HMO) | Admitting: *Deleted

## 2017-08-26 DIAGNOSIS — Z23 Encounter for immunization: Secondary | ICD-10-CM

## 2017-09-10 ENCOUNTER — Other Ambulatory Visit: Payer: Self-pay | Admitting: Family Medicine

## 2017-09-10 NOTE — Telephone Encounter (Signed)
He can have a 30-day refill but then needs an appointment before any other refills can be given.

## 2017-09-10 NOTE — Telephone Encounter (Signed)
Please advise, last seen 08/05/16 by you, no other appointments since and no new scheduled appointments?  How to proceed? Thanks!

## 2017-10-22 ENCOUNTER — Ambulatory Visit: Payer: Managed Care, Other (non HMO) | Admitting: Internal Medicine

## 2017-10-22 ENCOUNTER — Encounter: Payer: Self-pay | Admitting: Internal Medicine

## 2017-10-22 VITALS — BP 132/72 | HR 68 | Temp 98.0°F | Resp 16 | Ht 75.0 in | Wt 240.4 lb

## 2017-10-22 DIAGNOSIS — E785 Hyperlipidemia, unspecified: Secondary | ICD-10-CM

## 2017-10-22 DIAGNOSIS — E890 Postprocedural hypothyroidism: Secondary | ICD-10-CM

## 2017-10-22 DIAGNOSIS — E1065 Type 1 diabetes mellitus with hyperglycemia: Secondary | ICD-10-CM | POA: Diagnosis not present

## 2017-10-22 LAB — TSH: TSH: 18.25 u[IU]/mL — ABNORMAL HIGH (ref 0.35–4.50)

## 2017-10-22 LAB — MICROALBUMIN / CREATININE URINE RATIO
CREATININE, U: 121 mg/dL
MICROALB/CREAT RATIO: 0.6 mg/g (ref 0.0–30.0)
Microalb, Ur: <0.7 mg/dL (ref 0.0–1.9)

## 2017-10-22 LAB — LIPID PANEL
Cholesterol: 168 mg/dL (ref 0–200)
HDL: 42.8 mg/dL (ref >39.00)
NonHDL: 124.97
Total CHOL/HDL Ratio: 4
Triglycerides: 231 mg/dL — ABNORMAL HIGH (ref 0.0–149.0)
VLDL: 46.2 mg/dL — AB (ref 0.0–40.0)

## 2017-10-22 LAB — T4, FREE: Free T4: 0.9 ng/dL (ref 0.60–1.60)

## 2017-10-22 LAB — HEMOGLOBIN A1C: HEMOGLOBIN A1C: 7.7

## 2017-10-22 LAB — LDL CHOLESTEROL, DIRECT: Direct LDL: 106 mg/dL

## 2017-10-22 MED ORDER — FREESTYLE LIBRE 14 DAY READER DEVI: 1.0000 | Freq: Once | 1 refills | Status: AC

## 2017-10-22 MED ORDER — FREESTYLE LIBRE 14 DAY SENSOR MISC: 1.0000 | 11 refills | Status: DC

## 2017-10-22 NOTE — Progress Notes (Signed)
Patient ID: Willie Spencer, male   DOB: 18-Aug-1966, 52 y.o.   MRN: [id number]   HPI: Willie Spencer is a 52 y.o.-year-old male, returning for f/u for DM1, dx 1987, uncontrolled, without complications and post ablative hypothyroidism. He saw Dr. Gabriel Carina in the past, last visit 2015. Last visit with me: 7.5 months ago.  He quit smoking 4 weeks ago.  He is now on an insulin pump: - Omnipod, started 09/16/2016.  On Humalog.  DM1: Last hemoglobin A1c was: Lab Results  Component Value Date   HGBA1C 7.4 03/10/2017   HGBA1C 7.2 10/27/2016   HGBA1C 7.6% 07/30/2016   He was on a regimen of: - Lantus 40 units at dinnertime - Humalog 10-15 units 3x a day - AC  Now on:  - basal rates: 12 am: 1.4 units/h >> 1.5 3 am: 1.5 5 am: 1.4 >> 1.6 9 am: 1.3 1 pm: 1.4 - ICR: 4 - target: 100-100 - ISF: 35 - Insulin on Board: 4h TDD from basal insulin: 49% >> 53% >> 48% TDD from bolus insulin: 51%>> 47% >> 52% Total daily dose 60.7 units >> 70 units  - extended bolusing: not using - changes infusion site: q3 days  Pt checks his sugars 3 times a day - ave 198: - am: 120-180 >> 158-238, 315 >> 66 x1, 155-273 >> 100, 212-378 - 2h after b'fast: n/c >> 114-272 >> 37, 102-179, 238 >> n/c - before lunch: n/c >> 61-203, 264 >> 40, 62-124 >> 59, 201 - 2h after lunch: n/c >> 139 >> 63-135 >> 64-227 - before dinner: n/c >> 66, 84-228 >> 51-192 >> 52, 86, 114-178, 326 - 2h after dinner: 90-120 >> 220, 271 >> 63-222 >> 162--324 - bedtime: 120, 160s >> see above, + 330 x1 >> se above - nighttime: n/c Lowest sugar was 30s (wife called EMS - ~2013) >> 61 >> 37 (after a manual bolus) >> 52; he has hypoglycemia awareness in the 70s.  No previous hypoglycemia admission.  He does have a glucagon pen at home.   Highest sugar was 400s- seldom >> 315 >> 330 >> 300s.  No previous DKA admissions.  Pt's meals are: - Breakfast: sausage biscuits, orange crackers - Lunch: sandwich; fast food - Dinner: meat + veggies +  starch - Snacks: at bedtime; 3: fruit, PB crackers, nuts  - No CKD, last BUN/creatinine:  Lab Results  Component Value Date   BUN 12 07/30/2016   BUN 11 01/29/2016   CREATININE 0.84 07/30/2016   CREATININE 0.97 01/29/2016  On losartan. -+ HL; last set of lipids: Lab Results  Component Value Date   CHOL 173 03/10/2017   HDL 37.40 (L) 03/10/2017   LDLCALC 113 (H) 03/10/2017   TRIG 109.0 03/10/2017   CHOLHDL 5 03/10/2017   - last eye exam was in 2015: No DR - Denies numbness and tingling in his feet.  Post ablative hypothyroidism: - History of Graves' disease, s/p RAI treatment 07/2012  He had fluctuating TSH levels in the past, TSH was better, but still high, at last visit, so we increased his levothyroxine dose: Lab Results  Component Value Date   TSH 7.52 (H) 03/10/2017   TSH 25.34 (H) 10/27/2016   TSH 8.46 (H) 07/30/2016   TSH 61.14 (H) 01/29/2016   TSH 1.697 07/30/2015   FREET4 0.97 03/10/2017   FREET4 0.42 (L) 10/27/2016   FREET4 1.2 07/30/2016   Pt is on levothyroxine 175 mcg daily, taken: - in am - fasting -  at least 30 min from b'fast - no Ca, Fe, PPIs - + H2 blocker prn later in the day - + MVI in the evening - not on Biotin  Pt denies: - feeling nodules in neck - hoarseness - dysphagia - choking - SOB with lying down  ROS: Constitutional: no weight gain/no weight loss, no fatigue, no subjective hyperthermia, no subjective hypothermia Eyes: no blurry vision, no xerophthalmia ENT: no sore throat, + see HPI Cardiovascular: no CP/no SOB/no palpitations/no leg swelling Respiratory: no cough/no SOB/no wheezing Gastrointestinal: no N/no V/no D/no C/no acid reflux Musculoskeletal: no muscle aches/no joint aches Skin: no rashes, no hair loss Neurological: no tremors/no numbness/no tingling/no dizziness  I reviewed pt's medications, allergies, PMH, social hx, family hx, and changes were documented in the history of present illness. Otherwise, unchanged  from my initial visit note.  Past Medical History:  Diagnosis Date  . Diabetes mellitus without complication ([place])   . Hypertension   . Thyroid disease    No past surgical history on file. Social History   Social History  . Marital status: Married    Spouse name: N/A  . Number of children: 2   Occupational History  .  Maintenance supervisor   Social History Main Topics  . Smoking status: Current Every Day Smoker    Packs/day: 1.00    Types: Cigarettes    Last attempt to quit: 08/30/2015  . Smokeless tobacco: Never Used     Comment: uses ecigs  . Alcohol use      Comment: 6 beers per weekend  . Drug use: No   Current Outpatient Medications on File Prior to Visit  Medication Sig Dispense Refill  . BAYER MICROLET LANCETS lancets Use as instructed to check sugar 7 times daily. 400 each 5  . Blood Glucose Monitoring Suppl (BAYER CONTOUR NEXT MONITOR) w/Device KIT Used to check sugar 7 times daily. 1 kit 0  . cyproheptadine (PERIACTIN) 4 MG tablet Take 1 tablet (4 mg total) by mouth 3 (three) times daily as needed for allergies. 30 tablet 0  . glucose blood (BAYER CONTOUR NEXT TEST) test strip Use as instructed to check sugar 7 times daily. 400 each 5  . Insulin Disposable Pump (OMNIPOD) MISC Use 1 every 3 days.  Dx- E10.65 30 each 5  . Insulin Glargine (BASAGLAR KWIKPEN) 100 UNIT/ML SOPN INJECT 0.4 MLS (40 UNITS TOTAL) INTO THESKIN AT BEDTIME 15 mL 1  . insulin lispro (HUMALOG KWIKPEN) 100 UNIT/ML KiwkPen Inject 0.1-0.15 mLs (10-15 Units total) into the skin 3 (three) times daily. 15 mL 11  . insulin lispro (HUMALOG) 100 UNIT/ML injection Use ~70 units a day in insulin pump 90 mL 3  . Insulin Pen Needle (PEN NEEDLES) 31G X 6 MM MISC Use with insulin pen 100 each 1  . levothyroxine (SYNTHROID, LEVOTHROID) 150 MCG tablet TAKE 1 TABLET BY MOUTH DAILY 45 tablet 2  . levothyroxine (SYNTHROID, LEVOTHROID) 175 MCG tablet Take 1 tablet (175 mcg total) by mouth daily before breakfast. 90  tablet 1  . losartan (COZAAR) 25 MG tablet TAKE 1 TABLET BY MOUTH DAILY 30 tablet 1  . ranitidine (ZANTAC) 150 MG tablet Take 1 tablet (150 mg total) by mouth 2 (two) times daily. (Patient taking differently: Take 150 mg by mouth daily as needed. ) 20 tablet 0   No current facility-administered medications on file prior to visit.    Allergies  Allergen Reactions  . Wellbutrin [Bupropion]     Gum swelling  . Levemir [  Insulin Detemir] Rash   Family History  Problem Relation Age of Onset  . Diabetes Paternal Grandfather   . Stroke Paternal Grandfather   . Heart disease Paternal Grandfather     PE: BP 132/72 (BP Location: Left Arm, Patient Position: Sitting, Cuff Size: Large)   Pulse 68   Temp 98 F (36.7 C) (Oral)   Resp 16   Ht _0  (1.905 m)   Wt 240 lb 6.4 oz (109 kg)   SpO2 98%   BMI 30.05 kg/m  Wt Readings from Last 3 Encounters:  10/22/17 240 lb 6.4 oz (109 kg)  03/10/17 239 lb (108.4 kg)  10/27/16 249 lb (112.9 kg)   Constitutional: overweight, in NAD Eyes: PERRLA, EOMI, no exophthalmos ENT: moist mucous membranes, no thyromegaly, no cervical lymphadenopathy Cardiovascular: RRR, No MRG Respiratory: CTA B Gastrointestinal: abdomen soft, NT, ND, BS+ Musculoskeletal: no deformities, strength intact in all 4 Skin: moist, warm, no rashes Neurological: no tremor with outstretched hands, DTR normal in all 4  ASSESSMENT: 1. DM1, uncontrolled, without Long term complications, but with hyperglycemia  2. Post-ablative hypothyroidism  3.  Hyperlipidemia  PLAN:  1. Patient with long-standing, uncontrolled, type 1 diabetes, on an Omnipod insulin pump.  His sugars originally improved after switching to an Omnipod pump, but now they started to increase.  His sugars are highest in the morning and also he has some highs at night due to snacking.   - He is not checking sugars or entering carbs consistently in the pump so we discussed about the importance of doing this.  I also  advised him to start the boluses 10-15 minutes before every meal.   - He is interested in a freestyle libre CGM and I will send a prescription for this to his pharmacy.  I advised him to use the readings before the meals for improved accuracy of bolusing.   - His sugars are high in the morning, we will go ahead and increase his basal rates from 3 AM to 9 AM. - At next visit, may need a referral to nutrition for improving carb counting skills - We discussed about changes to his insulin regimen, as follows:  Patient Instructions  Please change the pump settings as follows: - basal rates: 12 am: 1.5 3 am: 1.5 >> 1.6 5 am: 1.6 >> 1.7 9 am: 1.3 1 pm: 1.4 - ICR: 4 - target: 100-100 - ISF: 35 - Insulin on Board: 4h  Please continue Levothyroxine 175 mcg daily.  Take the thyroid hormone every day, with water, at least 30 minutes before breakfast, separated by at least 4 hours from: - acid reflux medications - calcium - iron - multivitamins  Please stop at the lab.  Please come back for a follow-up appointment in 3 months   - today, HbA1c is 7.7% (higher) - continue checking sugars at different times of the day - check 4x a day, rotating checks - advised for yearly eye exams >> he is not UTD - We will check annual labs today - Return to clinic in 3 mo with sugar log    2. Post-ablative hypothyroidism - uncontrolled - latest thyroid labs reviewed with pt >> high TSH >> we increased the LT4 dose to 175 mcg daily - he continues on LT4 175 mcg daily - pt feels good on this dose. - we discussed about taking the thyroid hormone every day, with water, >30 minutes before breakfast, separated by >4 hours from acid reflux medications, calcium, iron, multivitamins. Pt.  is taking it correctly - will check thyroid tests today: TSH and fT4 - If labs are abnormal, he will need to return for repeat TFTs in 1.5 months  3.Hyperlipidemia  - Reviewed lipid panel from 02/2017: LDL above goal- - we  will repeat a lipid panel today (nonfasting) >> if LDL still high, he agrees to start the statin.  - time spent with the patient: 40 min, of which >50% was spent in reviewing his pump downloads, discussing his hypo- and hyper-glycemic episodes, reviewing previous labs and pump settings and developing a plan to avoid hypo- and hyper-glycemia.  We also addressed his hypothyroidism and hyperlipidemia.  Component     Latest Ref Rng & Units 10/22/2017  Glucose     65 - 99 mg/dL 251 (H)  BUN     7 - 25 mg/dL 14  Creatinine     0.60 - 1.35 mg/dL 1.09  GFR, Est Non African American     > OR = 60 mL/min/1.68m 79  GFR, Est African American     > OR = 60 mL/min/1.722m92  BUN/Creatinine Ratio     6 - 22 (calc) NOT APPLICABLE  Sodium     13053 146 mmol/L 136  Potassium     3.5 - 5.3 mmol/L 4.4  Chloride     98 - 110 mmol/L 98  CO2     20 - 32 mmol/L 30  Calcium     8.6 - 10.3 mg/dL 9.2  Total Protein     6.1 - 8.1 g/dL 6.5  Albumin MSPROF     3.6 - 5.1 g/dL 4.2  Globulin     1.9 - 3.7 g/dL (calc) 2.3  AG Ratio     1.0 - 2.5 (calc) 1.8  Total Bilirubin     0.2 - 1.2 mg/dL 0.4  Alkaline phosphatase (APISO)     40 - 115 U/L 83  AST     10 - 40 U/L 16  ALT     9 - 46 U/L 15  Cholesterol     0 - 200 mg/dL 168  Triglycerides     0.0 - 149.0 mg/dL 231.0 (H)  HDL Cholesterol     >39.00 mg/dL 42.80  VLDL     0.0 - 40.0 mg/dL 46.2 (H)  Total CHOL/HDL Ratio      4  NonHDL      124.97  Microalb, Ur     0.0 - 1.9 mg/dL <0.7  Creatinine,U     mg/dL 121.0  MICROALB/CREAT RATIO     0.0 - 30.0 mg/g 0.6  TSH     0.35 - 4.50 uIU/mL 18.25 (H)  T4,Free(Direct)     0.60 - 1.60 ng/dL 0.90  Direct LDL     mg/dL 106.0   Glucose high, slightly low GFR, normal LFTs. High triglycerides and LDL above goal.  Will start atorvastatin 20 mg daily. TSH very high, I suspect he missed doses.  Will again discuss with the patient.  CrPhilemon KingdomMD PhD [facility]

## 2017-10-22 NOTE — Patient Instructions (Addendum)
Please change the pump settings as follows: - basal rates: 12 am: 1.5 3 am: 1.5 >> 1.6 5 am: 1.6 >> 1.7 9 am: 1.3 1 pm: 1.4 - ICR: 4 - target: 100-100 - ISF: 35 - Insulin on Board: 4h  Please continue Levothyroxine 175 mcg daily.  Take the thyroid hormone every day, with water, at least 30 minutes before breakfast, separated by at least 4 hours from: - acid reflux medications - calcium - iron - multivitamins  Please stop at the lab.  Please come back for a follow-up appointment in 3 months

## 2017-10-23 ENCOUNTER — Other Ambulatory Visit: Payer: Self-pay | Admitting: Internal Medicine

## 2017-10-23 ENCOUNTER — Telehealth: Payer: Self-pay | Admitting: Internal Medicine

## 2017-10-23 LAB — COMPLETE METABOLIC PANEL WITH GFR
AG RATIO: 1.8 (calc) (ref 1.0–2.5)
ALT: 15 U/L (ref 9–46)
AST: 16 U/L (ref 10–40)
Albumin: 4.2 g/dL (ref 3.6–5.1)
Alkaline phosphatase (APISO): 83 U/L (ref 40–115)
BUN: 14 mg/dL (ref 7–25)
CALCIUM: 9.2 mg/dL (ref 8.6–10.3)
CO2: 30 mmol/L (ref 20–32)
Chloride: 98 mmol/L (ref 98–110)
Creat: 1.09 mg/dL (ref 0.60–1.35)
GFR, EST AFRICAN AMERICAN: 92 mL/min/{1.73_m2} (ref >=60)
GFR, EST NON AFRICAN AMERICAN: 79 mL/min/{1.73_m2} (ref >=60)
GLUCOSE: 251 mg/dL — AB (ref 65–99)
Globulin: 2.3 g/dL (calc) (ref 1.9–3.7)
POTASSIUM: 4.4 mmol/L (ref 3.5–5.3)
Sodium: 136 mmol/L (ref 135–146)
TOTAL PROTEIN: 6.5 g/dL (ref 6.1–8.1)
Total Bilirubin: 0.4 mg/dL (ref 0.2–1.2)

## 2017-10-23 MED ORDER — ATORVASTATIN CALCIUM 20 MG PO TABS: 20.0000 mg | ORAL_TABLET | Freq: Every day | ORAL | 3 refills | Status: DC

## 2017-10-23 NOTE — Telephone Encounter (Signed)
Patient wife stated that patient Libre sensor and meter is to expensive is there another alternative. Please asdvise  Please call wife Willie Spencer [phone]

## 2017-10-26 NOTE — Telephone Encounter (Signed)
Linda,  Can you please help with this? 

## 2017-10-26 NOTE — Addendum Note (Signed)
Addended by: Yolande JollyLAWSON, Jayant Kriz on: 10/26/2017 01:09 PM   Modules accepted: Orders

## 2017-10-26 NOTE — Telephone Encounter (Signed)
Spoke with wife she is aware that another meter was sent on 1/25 for him she is calling to confirm with pharmacy

## 2017-10-26 NOTE — Telephone Encounter (Signed)
Pt states the pharmacy did not receive the rx,[facility]

## 2017-10-26 NOTE — Telephone Encounter (Signed)
Pt does not need any supplies sent in. He still has testing supplies left. He can not get his Omnipod because it is considered durable medical supplies and to high on his insurance. Please advise on what he should do for pumps or another route he can take that insurance will pay for.

## 2017-10-27 NOTE — Telephone Encounter (Signed)
Thank you, Bonita QuinLinda! Let me know how this goes.

## 2017-10-27 NOTE — Telephone Encounter (Signed)
Insurance information set to [name] rep., to see if he can bill for pods under pharmacy benefits, or another way to get these any cheaper.

## 2017-10-30 ENCOUNTER — Telehealth: Payer: Self-pay | Admitting: Internal Medicine

## 2017-10-30 NOTE — Telephone Encounter (Signed)
[facility] calling on status of paper work they faxed over to see if we have received it,     [phone] Ref # [id number]

## 2017-10-30 NOTE — Telephone Encounter (Signed)
Bonita QuinLinda, do you happen to have this paperwork in your stack?

## 2017-11-04 ENCOUNTER — Telehealth: Payer: Self-pay

## 2017-11-04 NOTE — Telephone Encounter (Signed)
[facility] calling back to get status up paperwork sent to us- I asked them to resend

## 2017-11-05 NOTE — Telephone Encounter (Signed)
Paperwork filled out and faxed to [employer] for new insurance investigation and LOM sent to [employer]

## 2017-11-06 DIAGNOSIS — E1065 Type 1 diabetes mellitus with hyperglycemia: Secondary | ICD-10-CM | POA: Diagnosis not present

## 2017-11-11 ENCOUNTER — Telehealth: Payer: Self-pay

## 2017-11-11 ENCOUNTER — Other Ambulatory Visit: Payer: Self-pay | Admitting: Internal Medicine

## 2017-11-11 ENCOUNTER — Telehealth: Payer: Self-pay | Admitting: Family Medicine

## 2017-11-11 NOTE — Telephone Encounter (Signed)
The patient is requesting more pods until Saturday. That's when he will receive more. He has run out.

## 2017-11-11 NOTE — Telephone Encounter (Signed)
Do you have any of the Omnipods for the pt?

## 2017-11-11 NOTE — Telephone Encounter (Signed)
Patient calling about status of PA on humalog vials for Omnipods he stated he spoke to [place] but has not heard back and he is almost out of insulin- has this PA been started? Please call the patient and let him know where we are with this

## 2017-11-12 NOTE — Telephone Encounter (Signed)
[facility] to [facility] for [facility].

## 2017-11-12 NOTE — Telephone Encounter (Signed)
Pt. Was called and told that I have 2 pods that he can have until his box comes in.

## 2017-11-12 NOTE — Telephone Encounter (Signed)
Called pt because a fax was sent by Pilar JarvisLinda S on 11/05/17 for Omnipod, have not received a PA for humalog vials. Unable to LVM

## 2017-11-13 NOTE — Telephone Encounter (Signed)
Called pt unable to LVM, form has been faxed over a week ago for his Omnipod supplies but still nothing for the humalog vials. Have been unable to speak with pt about all of this.

## 2017-11-30 ENCOUNTER — Other Ambulatory Visit: Payer: Self-pay | Admitting: Internal Medicine

## 2018-01-20 ENCOUNTER — Encounter: Payer: Self-pay | Admitting: Internal Medicine

## 2018-01-20 ENCOUNTER — Ambulatory Visit (INDEPENDENT_AMBULATORY_CARE_PROVIDER_SITE_OTHER): Payer: 59 | Admitting: Internal Medicine

## 2018-01-20 VITALS — BP 152/94 | HR 66 | Ht 75.0 in | Wt 238.0 lb

## 2018-01-20 DIAGNOSIS — E785 Hyperlipidemia, unspecified: Secondary | ICD-10-CM | POA: Diagnosis not present

## 2018-01-20 DIAGNOSIS — E1065 Type 1 diabetes mellitus with hyperglycemia: Secondary | ICD-10-CM

## 2018-01-20 DIAGNOSIS — E890 Postprocedural hypothyroidism: Secondary | ICD-10-CM | POA: Diagnosis not present

## 2018-01-20 LAB — POCT GLYCOSYLATED HEMOGLOBIN (HGB A1C): Hemoglobin A1C: 7

## 2018-01-20 LAB — TSH: TSH: 11.52 u[IU]/mL — ABNORMAL HIGH (ref 0.35–4.50)

## 2018-01-20 LAB — T4, FREE: Free T4: 0.97 ng/dL (ref 0.60–1.60)

## 2018-01-20 MED ORDER — ATORVASTATIN CALCIUM 20 MG PO TABS
20.0000 mg | ORAL_TABLET | Freq: Every day | ORAL | 3 refills | Status: DC
Start: 2018-01-20 — End: 2019-02-24

## 2018-01-20 MED ORDER — LEVOTHYROXINE SODIUM 200 MCG PO TABS: 200.0000 ug | ORAL_TABLET | Freq: Every day | ORAL | 3 refills | Status: DC

## 2018-01-20 NOTE — Patient Instructions (Addendum)
Please continue the pump settings as follows: - basal rates: 12 am: 1.5 3 am: 1.6 5 am: 1.7 9 am: 1.3 1 pm: 1.4 - ICR: 4, except 12-3 pm: 5 - target: 100-100 - ISF: 35 - Insulin on Board: 4h  Start checking sugars at bedtime.  Try to enter carbs more accurately with dinner. If sugars do not improve overnight and in am, then you may need an ICR with dinner on 3.5.  Please continue Levothyroxine 175 mcg daily.  Take the thyroid hormone every day, with water, at least 30 minutes before breakfast, separated by at least 4 hours from: - acid reflux medications - calcium - iron - multivitamins  Please stop at the lab.  Please come back for a follow-up appointment in 3 months

## 2018-01-20 NOTE — Progress Notes (Signed)
Patient ID: Willie Spencer, male   DOB: 03-13-1966, 52 y.o.   MRN: [id number]   HPI: Willie Spencer is a 52 y.o.-year-old male, returning for f/u for DM1, dx 1987, uncontrolled, without complications and post ablative hypothyroidism. He saw Dr. Tedd Sias in the past, last visit 2015. Last visit with me: 3 months ago.  He is now on an insulin pump: On Omnipod, started 09/16/2016.  Humalog in the pump  DM1: Last hemoglobin A1c was: Lab Results  Component Value Date   HGBA1C 7.0 01/20/2018   HGBA1C 7.4 03/10/2017   HGBA1C 7.2 10/27/2016   He was on a regimen of: - Lantus 40 units at dinnertime - Humalog 10-15 units 3x a day - AC  Now on:  - basal rates: 12 am: 1.5 3 am: 1.6 5 am: 1.7 9 am: 1.3 1 pm: 1.4 - ICR: 4 - target: 100-100 - ISF: 35 - Insulin on Board: 4h TDD from basal insulin: 49% >> 53% >> 48% >> 50% TDD from bolus insulin: 51%>> 47% >> 52% >> 50% Total daily dose 60.7 units >> up to 70 units  - extended bolusing: not using - changes infusion site: q3 days  Pt checks his sugars 4.4 times a day: - am: 66 x1, 155-273 >> 100, 212-378 >> 214-296 - 2h after b'fast: 37, 102-179, 238 >> n/c >> 84 - before lunch: 40, 62-124 >> 59, 201 >> 96-162 - 2h after lunch: 63-135 >> 64-227 >> 49, 53, 62 - before dinner:51-192 >> 52, 86, 114-178, 326 >> 99-199, 390 - 2h after dinner:63-222 >> 162--324 >> n/c - bedtime: 1see above, + 330 x1 >> see above - nighttime: n/c Lowest sugar was 30s (wife called EMS - ~2013) >> 61 >> 37 (after a manual bolus) >> 52 >> 49; he has hypoglycemia awareness in the 70s.  No previous hypoglycemia admissions admission.  He does have a a glucagon pen at home.   Highest sugar was 400s- seldom >> 315 >> 330 >> 300s >> 300. No previous DKA admissions   Pt's meals are: - Breakfast: sausage biscuits, orange crackers - Lunch: sandwich; fast food - Dinner: meat + veggies + starch - Snacks: at bedtime; 3: fruit, PB crackers, nuts  -No CKD, last BUN/creatinine:   Lab Results  Component Value Date   BUN 14 10/22/2017   BUN 12 07/30/2016   CREATININE 1.09 10/22/2017   CREATININE 0.84 07/30/2016  On losartan. -+ HL; last set of lipids: Lab Results  Component Value Date   CHOL 168 10/22/2017   HDL 42.80 10/22/2017   LDLCALC 113 (H) 03/10/2017   LDLDIRECT 106.0 10/22/2017   TRIG 231.0 (H) 10/22/2017   CHOLHDL 4 10/22/2017  Did not start pravastatin 20 in 09/2017, as advised. - last eye exam was in 2015: No DR -No numbness and tingling in his feet.  Post ablative hypothyroidism: - History of Graves' disease, s/p RAI treatment 07/2012  Very fluctuating TSH levels: Lab Results  Component Value Date   TSH 18.25 (H) 10/22/2017   TSH 7.52 (H) 03/10/2017   TSH 25.34 (H) 10/27/2016   TSH 8.46 (H) 07/30/2016   TSH 61.14 (H) 01/29/2016   TSH 1.697 07/30/2015   FREET4 0.90 10/22/2017   FREET4 0.97 03/10/2017   FREET4 0.42 (L) 10/27/2016   FREET4 1.2 07/30/2016   Pt is on levothyroxine 175 mcg daily, taken: - Daily - in am - fasting - at least 30 min from b'fast - no Ca, Fe - no H2 blocker  - +  MVI in evening - not on Biotin  Pt denies: - feeling nodules in neck - hoarseness - dysphagia - choking - SOB with lying down  ROS: Constitutional: no weight gain/no weight loss, no fatigue, no subjective hyperthermia, no subjective hypothermia Eyes: no blurry vision, no xerophthalmia ENT: no sore throat, + see HPI Cardiovascular: no CP/no SOB/no palpitations/no leg swelling Respiratory: no cough/no SOB/no wheezing Gastrointestinal: no N/no V/no D/no C/no acid reflux Musculoskeletal: no muscle aches/no joint aches Skin: no rashes, no hair loss Neurological: no tremors/no numbness/no tingling/no dizziness  I reviewed pt's medications, allergies, PMH, social hx, family hx, and changes were documented in the history of present illness. Otherwise, unchanged from my initial visit note.  Past Medical History:  Diagnosis Date  . Diabetes  mellitus without complication (HCC)   . Hypertension   . Thyroid disease    No past surgical history on file. Social History   Social History  . Marital status: Married    Spouse name: N/A  . Number of children: 2   Occupational History  .  Maintenance supervisor   Social History Main Topics  . Smoking status: Current Every Day Smoker    Packs/day: 1.00    Types: Cigarettes    Last attempt to quit: 08/30/2015  . Smokeless tobacco: Never Used     Comment: uses ecigs  . Alcohol use      Comment: 6 beers per weekend  . Drug use: No   Current Outpatient Medications on File Prior to Visit  Medication Sig Dispense Refill  . Insulin Disposable Pump (OMNIPOD) MISC Use 1 every 3 days.  Dx- E10.65 30 each 5  . insulin lispro (HUMALOG KWIKPEN) 100 UNIT/ML KiwkPen Inject 0.1-0.15 mLs (10-15 Units total) into the skin 3 (three) times daily. 15 mL 11  . insulin lispro (HUMALOG) 100 UNIT/ML injection USE UP TO 70 UNITS PER DAY IN THE INSULIN PUMP. 30 mL 10  . Insulin Pen Needle (PEN NEEDLES) 31G X 6 MM MISC Use with insulin pen 100 each 1  . levothyroxine (SYNTHROID, LEVOTHROID) 175 MCG tablet Take 1 tablet (175 mcg total) by mouth daily before breakfast. 90 tablet 1  . losartan (COZAAR) 25 MG tablet TAKE 1 TABLET BY MOUTH DAILY 30 tablet 1  . ranitidine (ZANTAC) 150 MG tablet Take 1 tablet (150 mg total) by mouth 2 (two) times daily. (Patient taking differently: Take 150 mg by mouth daily as needed. ) 20 tablet 0   No current facility-administered medications on file prior to visit.    Allergies  Allergen Reactions  . Wellbutrin [Bupropion]     Gum swelling  . Levemir [Insulin Detemir] Rash   Family History  Problem Relation Age of Onset  . Diabetes Paternal Grandfather   . Stroke Paternal Grandfather   . Heart disease Paternal Grandfather     PE: BP (!) 152/94   Pulse 66   Ht 6\' 3"  (1.905 m)   Wt 238 lb (108 kg)   SpO2 99%   BMI 29.75 kg/m  Wt Readings from Last 3  Encounters:  01/20/18 238 lb (108 kg)  10/22/17 240 lb 6.4 oz (109 kg)  03/10/17 239 lb (108.4 kg)   Constitutional: overweight, in NAD Eyes: PERRLA, EOMI, no exophthalmos ENT: moist mucous membranes, no thyromegaly, no cervical lymphadenopathy Cardiovascular: RRR, No MRG Respiratory: CTA B Gastrointestinal: abdomen soft, NT, ND, BS+ Musculoskeletal: no deformities, strength intact in all 4 Skin: moist, warm, no rashes Neurological: no tremor with outstretched hands, DTR normal  in all 4  ASSESSMENT: 1. DM1, uncontrolled, without Long term complications, but with hyperglycemia  2. Post-ablative hypothyroidism  3.  Hyperlipidemia  PLAN:  1. Patient with long-standing, uncontrolled, type 1 diabetes, on an OmniPod insulin pump.  His sugars are better than before, but persistently high in the morning.  He is not checking sugars after dinner but I suspect that these are the source of hyperglycemia throughout the night and in the morning.  I strongly advised him to start checking sugars at night, also.  Reviewing the pump downloads and also discussing with the patient, he is actually entering less carbs with dinner than he should and as a consequence, he does develop hyperglycemia after dinner.  During the day, he is enter the accurate amount.  We discussed about the importance of entering the right amount of carbs especially with dinner and he will try to do so.  If this does not think improve the sugars at bedtime and in the morning, I advised him to decrease the insulin to carb ratio with dinner.  He may need a referral to nutrition for carb counting refresher in the near future. -He is having low blood sugars after lunch and occasionally before dinner.  These do appear to be due to increased activity in the afternoon and occasionally playing softball.  I advised him to decrease his insulin with lunch (will increase ICR with this meal) -I do not feel that other changes are needed for now -We  discussed about changes in his insulin regimen insulin regimen, as follows:  Patient Instructions  Please continue the pump settings as follows: - basal rates: 12 am: 1.5 3 am: 1.6 5 am: 1.7 9 am: 1.3 1 pm: 1.4 - ICR: 4, except 12-3 pm: 5 - target: 100-100 - ISF: 35 - Insulin on Board: 4h  Start checking sugars at bedtime.  Try to enter carbs more accurately with dinner. If sugars do not improve overnight and in am, then you may need an ICR with dinner on 3.5.  Please continue Levothyroxine 175 mcg daily.  Take the thyroid hormone every day, with water, at least 30 minutes before breakfast, separated by at least 4 hours from: - acid reflux medications - calcium - iron - multivitamins  Please stop at the lab.  Please come back for a follow-up appointment in 3 months  - today, HbA1c is 7.0% (better) - continue checking sugars at different times of the day - check at least 4x a day, rotating checks - advised for yearly eye exams >> he is not UTD - Return to clinic in 3 mo with sugar log    2. Post-ablative hypothyroidism - uncontrolled - latest thyroid labs reviewed with pt >> very high, but I suspected medication noncompliance and we did not change the dose at that time - he continues on LT4 175 mcg daily - pt feels good on this dose. - we discussed about taking the thyroid hormone every day, with water, >30 minutes before breakfast, separated by >4 hours from acid reflux medications, calcium, iron, multivitamins. Pt. is taking it correctly. - will check thyroid tests today: TSH and fT4 - If labs are abnormal, he will need to return for repeat TFTs in 1.5 months  3.Hyperlipidemia  -  reviewed  latest lipid panel: LDL above goal - did not start atorvastatin 20 mg daily as advised at last visit...>> will start now  Office Visit on 01/20/2018  Component Date Value Ref Range Status  . Hemoglobin A1C  01/20/2018 7.0   Final  . Hemoglobin A1C 10/22/2017 7.7   Final  . TSH  01/20/2018 11.52* 0.35 - 4.50 uIU/mL Final  . Free T4 01/20/2018 0.97  0.60 - 1.60 ng/dL Final   Comment: Specimens from patients who are undergoing biotin therapy and /or ingesting biotin supplements may contain high levels of biotin.  The higher biotin concentration in these specimens interferes with this Free T4 assay.  Specimens that contain high levels  of biotin may cause false high results for this Free T4 assay.  Please interpret results in light of the total clinical presentation of the patient.     TSH slightly better, but still high.  We will increase his levothyroxine to 200 mcg daily and will recheck his TFTs at next visit.  - time spent with the patient: 40 min, of which >50% was spent in reviewing his pump downloads (we will scan reports), discussing his hypo- and hyper-glycemic episodes, reviewing previous labs and pump settings and developing a plan to avoid hypo- and hyper-glycemia.  We also addressed his hyperlipidemia and uncontrolled hypothyroidism.   Carlus Pavlovristina Aarvi Stotts, MD PhD [facility]

## 2018-01-26 ENCOUNTER — Telehealth: Payer: Self-pay | Admitting: Internal Medicine

## 2018-01-26 NOTE — Telephone Encounter (Signed)
Zantac. Pharmacy stated that the Patient called pharmacy and requested this medication  Please advise  [id number] - [place], [place] - [address]  [phone]

## 2018-01-26 NOTE — Telephone Encounter (Signed)
Called pt to inform Dr. Reece Agar does not manage his Zantac Rx. No answer.

## 2018-01-27 DIAGNOSIS — E1065 Type 1 diabetes mellitus with hyperglycemia: Secondary | ICD-10-CM | POA: Diagnosis not present

## 2018-02-05 ENCOUNTER — Telehealth: Payer: Self-pay | Admitting: Internal Medicine

## 2018-02-05 NOTE — Telephone Encounter (Signed)
Left mess for patient to call back. Need to know which meds he is needing.

## 2018-02-05 NOTE — Telephone Encounter (Signed)
Patient need the two new medication sent to pharmacy  [phone], [place] - [address] [phone] (Phone) [phone] (Fax)    They do not know the name of the medication  Patient wife stated that they can not get on mychart, so please call her, they are going out of town tomorrow need medication called in to pharmacy today.  Please advise

## 2018-04-20 DIAGNOSIS — E1065 Type 1 diabetes mellitus with hyperglycemia: Secondary | ICD-10-CM | POA: Diagnosis not present

## 2018-04-27 ENCOUNTER — Ambulatory Visit: Payer: 59 | Admitting: Internal Medicine

## 2018-04-27 ENCOUNTER — Telehealth: Payer: Self-pay | Admitting: Internal Medicine

## 2018-04-27 MED ORDER — GLUCOSE BLOOD VI STRP: ORAL_STRIP | 3 refills | Status: DC

## 2018-04-27 NOTE — Telephone Encounter (Signed)
Patient needs RX for Contour or FreeStyle test strips for his [facility]ipod [facility]

## 2018-04-27 NOTE — Telephone Encounter (Signed)
Sent Contour test strips

## 2018-07-06 DIAGNOSIS — E1065 Type 1 diabetes mellitus with hyperglycemia: Secondary | ICD-10-CM | POA: Diagnosis not present

## 2018-08-19 ENCOUNTER — Encounter

## 2018-08-19 ENCOUNTER — Ambulatory Visit: Payer: 59 | Admitting: Internal Medicine

## 2018-08-19 VITALS — BP 136/80 | HR 67 | Ht 75.0 in | Wt 232.0 lb

## 2018-08-19 DIAGNOSIS — Z23 Encounter for immunization: Secondary | ICD-10-CM

## 2018-08-19 DIAGNOSIS — E890 Postprocedural hypothyroidism: Secondary | ICD-10-CM

## 2018-08-19 DIAGNOSIS — E1065 Type 1 diabetes mellitus with hyperglycemia: Secondary | ICD-10-CM

## 2018-08-19 LAB — COMPLETE METABOLIC PANEL WITH GFR
AG Ratio: 1.9 (calc) (ref 1.0–2.5)
ALKALINE PHOSPHATASE (APISO): 76 U/L (ref 40–115)
ALT: 15 U/L (ref 9–46)
AST: 19 U/L (ref 10–35)
Albumin: 4.5 g/dL (ref 3.6–5.1)
BUN: 12 mg/dL (ref 7–25)
CALCIUM: 9.3 mg/dL (ref 8.6–10.3)
CO2: 33 mmol/L — AB (ref 20–32)
CREATININE: 1.17 mg/dL (ref 0.70–1.33)
Chloride: 100 mmol/L (ref 98–110)
GFR, EST NON AFRICAN AMERICAN: 72 mL/min/{1.73_m2} (ref >=60)
GFR, Est African American: 84 mL/min/{1.73_m2} (ref >=60)
GLUCOSE: 125 mg/dL — AB (ref 65–99)
Globulin: 2.4 g/dL (calc) (ref 1.9–3.7)
Potassium: 4 mmol/L (ref 3.5–5.3)
Sodium: 139 mmol/L (ref 135–146)
Total Bilirubin: 0.5 mg/dL (ref 0.2–1.2)
Total Protein: 6.9 g/dL (ref 6.1–8.1)

## 2018-08-19 LAB — MICROALBUMIN / CREATININE URINE RATIO
CREATININE, U: 46.4 mg/dL
MICROALB/CREAT RATIO: 1.5 mg/g (ref 0.0–30.0)

## 2018-08-19 LAB — POCT GLYCOSYLATED HEMOGLOBIN (HGB A1C): HEMOGLOBIN A1C: 7.3 % — AB (ref 4.0–5.6)

## 2018-08-19 NOTE — Patient Instructions (Addendum)
Please use the following pump settings: - basal rates: 12 am: 1.5 3 am: 1.5 >> 1.6 5 am: 1.6 >> 1.7 9 am: 1.3 1 pm: 1.4 - ICR: 4 - target: 100-100 - ISF: 35 - Insulin on Board: 4h  ENTER ALL CARBS AND SUGARS IN THE PUMP.  Schedule an appt with Bonita QuinLinda when you get the new pump.  Please continue Levothyroxine 200 mcg daily.  Take the thyroid hormone every day, with water, at least 30 minutes before breakfast, separated by at least 4 hours from: - acid reflux medications - calcium - iron - multivitamins  Please stop at the lab.  Please come back for a follow-up appointment in 3 months.

## 2018-08-19 NOTE — Progress Notes (Addendum)
Patient ID: Willie Spencer, male   DOB: 1966/08/23, 52 y.o.   MRN: [id number]   HPI: Willie Spencer is a 52 y.o.-year-old male, returning for f/u for DM1, dx 1987, uncontrolled, without complications and post ablative hypothyroidism. He saw Dr. Tedd Sias in the past, last visit 2015.  Last visit with me 7 months ago.  He is on insulin pump: Omnipod, started 09/16/2016.  Uses Humalog in the pump.  His  Pump is giving him errors.  He contacted [employer] and they asked him to send in his pump.  Obviously, he was reticent to do this without having a replacement pump instead.  However, he is interested in changing to a Medtronic 670 G pump.  This is covered by his insurance, while Omnipod is not.  He plans to do this as soon as possible.  He already filled out the paperwork online.  DM1: Last hemoglobin A1c was: Lab Results  Component Value Date   HGBA1C 7.0 01/20/2018   HGBA1C 7.7 10/22/2017   HGBA1C 7.4 03/10/2017   He was on a regimen of: - Lantus 40 units at dinnertime - Humalog 10-15 units 3x a day - AC  Now on: Reviewing his pump settings, they are different than suggested at last visit: - basal rates: 12 am: 1.5 3 am: >> 1.5 5 am: >> 1.6 9 am: 1.3 1 pm: 1.4 - ICR: 4 - target: 100-100 - ISF: 35 - Insulin on Board: 4h TDD from basal insulin:50% >> 46% (32 units) TDD from bolus insulin: 50% >> 54% (37 units) Total daily dose 60.7 units >> up to 70 units - extended bolusing: not using - changes infusion site: q3 days  Pt checks his sugars once a day: - am: 66 x1, 155-273 >> 100, 212-378 >> 214-296 - 2h after b'fast: 37, 102-179, 238 >> n/c >> 84 - before lunch: 40, 62-124 >> 59, 201 >> 96-162 - 2h after lunch: 63-135 >> 64-227 >> 49, 53, 62 - before dinner:52, 86, 114-178, 326 >> 99-199, 390 - 2h after dinner:63-222 >> 162--324 >> n/c - bedtime: 1see above, + 330 x1 >> see above - nighttime: n/c Lowest sugar was 30s (wife called EMS - ~2013) >> 61 >> 37 (after a manual bolus) >> 52  >> 49 >> 50s; he has hypoglycemia awareness in the 70s.  No previous hypoglycemia admissions admission.  He does have a glucagon pen at home. Highest sugar was 300 >> 306.  No previous DKA admissions   Pt's meals are: - Breakfast: sausage biscuits, orange crackers - Lunch: sandwich; fast food - Dinner: meat + veggies + starch - Snacks: at bedtime; 3: fruit, PB crackers, nuts  -No CKD, last BUN/creatinine:  Lab Results  Component Value Date   BUN 14 10/22/2017   BUN 12 07/30/2016   CREATININE 1.09 10/22/2017   CREATININE 0.84 07/30/2016  On losartan  -+ HL; latest set of lipids: Lab Results  Component Value Date   CHOL 168 10/22/2017   HDL 42.80 10/22/2017   LDLCALC 113 (H) 03/10/2017   LDLDIRECT 106.0 10/22/2017   TRIG 231.0 (H) 10/22/2017   CHOLHDL 4 10/22/2017  On pravastatin 20-started after last lipid panel  - last eye exam was in 2015: No DR  - No numbness and tingling in his feet.  Post ablative hypothyroidism: - History of Graves' disease, s/p RAI treatment 07/2012  She has very fluctuating TFTs in the past: Lab Results  Component Value Date   TSH 11.52 (H) 01/20/2018  TSH 18.25 (H) 10/22/2017   TSH 7.52 (H) 03/10/2017   TSH 25.34 (H) 10/27/2016   TSH 8.46 (H) 07/30/2016   TSH 61.14 (H) 01/29/2016   TSH 1.697 07/30/2015   FREET4 0.97 01/20/2018   FREET4 0.90 10/22/2017   FREET4 0.97 03/10/2017   FREET4 0.42 (L) 10/27/2016   FREET4 1.2 07/30/2016   Pt is on levothyroxine 200 mcg daily: - in am - fasting - at least 30 min from b'fast - no Ca, Fe, PPIs - + MVI in the evening - not on Biotin  Pt denies: - feeling nodules in neck - hoarseness - dysphagia - choking - SOB with lying down  ROS: Constitutional: no weight gain/no weight loss, no fatigue, no subjective hyperthermia, no subjective hypothermia Eyes: no blurry vision, no xerophthalmia ENT: no sore throat, + see HPI Cardiovascular: no CP/no SOB/no palpitations/no leg  swelling Respiratory: no cough/no SOB/no wheezing Gastrointestinal: no N/no V/no D/no C/no acid reflux Musculoskeletal: no muscle aches/no joint aches Skin: no rashes, no hair loss Neurological: no tremors/no numbness/no tingling/no dizziness  I reviewed pt's medications, allergies, PMH, social hx, family hx, and changes were documented in the history of present illness. Otherwise, unchanged from my initial visit note.  Past Medical History:  Diagnosis Date  . Diabetes mellitus without complication (HCC)   . Hypertension   . Thyroid disease    No past surgical history on file. Social History   Social History  . Marital status: Married    Spouse name: N/A  . Number of children: 2   Occupational History  .  Maintenance supervisor   Social History Main Topics  . Smoking status: Current Every Day Smoker    Packs/day: 1.00    Types: Cigarettes    Last attempt to quit: 08/30/2015  . Smokeless tobacco: Never Used     Comment: uses ecigs  . Alcohol use      Comment: 6 beers per weekend  . Drug use: No   Current Outpatient Medications on File Prior to Visit  Medication Sig Dispense Refill  . atorvastatin (LIPITOR) 20 MG tablet Take 1 tablet (20 mg total) by mouth daily. 90 tablet 3  . glucose blood (CONTOUR NEXT TEST) test strip Use as instructed to test sugars 4 times daily 400 each 3  . Insulin Disposable Pump (OMNIPOD) MISC Use 1 every 3 days.  Dx- E10.65 30 each 5  . insulin lispro (HUMALOG KWIKPEN) 100 UNIT/ML KiwkPen Inject 0.1-0.15 mLs (10-15 Units total) into the skin 3 (three) times daily. 15 mL 11  . insulin lispro (HUMALOG) 100 UNIT/ML injection USE UP TO 70 UNITS PER DAY IN THE INSULIN PUMP. 30 mL 10  . Insulin Pen Needle (PEN NEEDLES) 31G X 6 MM MISC Use with insulin pen 100 each 1  . levothyroxine (SYNTHROID, LEVOTHROID) 200 MCG tablet Take 1 tablet (200 mcg total) by mouth daily before breakfast. 90 tablet 3  . losartan (COZAAR) 25 MG tablet TAKE 1 TABLET BY MOUTH  DAILY 30 tablet 1  . ranitidine (ZANTAC) 150 MG tablet Take 1 tablet (150 mg total) by mouth 2 (two) times daily. (Patient taking differently: Take 150 mg by mouth daily as needed. ) 20 tablet 0   No current facility-administered medications on file prior to visit.    Allergies  Allergen Reactions  . Wellbutrin [Bupropion]     Gum swelling  . Levemir [Insulin Detemir] Rash   Family History  Problem Relation Age of Onset  . Diabetes Paternal Grandfather   .  Stroke Paternal Grandfather   . Heart disease Paternal Grandfather     PE: BP 136/80   Pulse 67   Ht 6\' 3"  (1.905 m)   Wt 232 lb (105.2 kg)   SpO2 97%   BMI 29.00 kg/m  Wt Readings from Last 3 Encounters:  08/19/18 232 lb (105.2 kg)  01/20/18 238 lb (108 kg)  10/22/17 240 lb 6.4 oz (109 kg)   Constitutional: overweight, in NAD Eyes: PERRLA, EOMI, no exophthalmos ENT: moist mucous membranes, no thyromegaly, no cervical lymphadenopathy Cardiovascular: RRR, No MRG Respiratory: CTA B Gastrointestinal: abdomen soft, NT, ND, BS+ Musculoskeletal: no deformities, strength intact in all 4 Skin: moist, warm, no rashes Neurological: no tremor with outstretched hands, DTR normal in all 4  ASSESSMENT: 1. DM1, uncontrolled, without Long term complications, but with hyperglycemia  2. Post-ablative hypothyroidism  3.  Hyperlipidemia  PLAN:  1. Patient with long-standing, uncontrolled, type 1 diabetes, on an Omni pod insulin pump.  Sugars were better than before at last visit, but they were still high in the morning.  He was not checking sugars after dinner and I advised him to start doing so.  I did suspect that post dinner sugars were the source of his hypoglycemia in the morning.  Upon questioning, he was entering less carbs with dinner at that time.  I advised him to try to enter an accurate amount.  We also decrease insulin to carb ratio with dinner.  He may needs a referral to nutrition for carb counting refresher in the near  future.  He was occasionally having low blood sugars after lunch and occasionally before dinner so we increase his insulin to carb ratio with lunch. -At this visit, sugars are higher especially after dinner (she mentions that he eats a lot for dinner) and then he corrects her sugars with subsequent improvement overnight.  In the morning, he wakes up usually with high sugars.  Reviewing his pump settings, he is basal rates from 3 AM to 9 AM are lower than at last visit (he does not know why) so I advised him to increase them back.  Also, at last visit as he was dropping his sugars before dinner I advised him to increase his ICR with lunch, but this is not appearing in his pump right now.  However, based on the patterns now, he is not low before dinner so I advised him to continue with a single insulin to carb ratio with all meals. -He is not checking sugars consistently or introducing carbs with every meal.  We discussed why this is important.  I also underlined the fact that especially when he switches to the new pump he would be mandatory for him to bolus 15 minutes before meals, and not at the start of the meal. -He is interested to change his insulin pump to Medtronic 670 G insulin pump.  We discussed about pluses and minuses of this.  Overall, I do recommend it, especially since this is covered by his insurance.  We discussed about the closed-loop concept and the fact that he will need to check his sugars 4 times a day to calibrate the sensor.  We also discussed about the automatic and manual mode and conditions in which he may be kicked out of the automatic mode.  At this visit, I placed the referral for the patient to see the diabetes educator to have the new pump attached as soon as he gets it. -We will also check annual labs today -I  recommended to: Patient Instructions  Please use the following pump settings: - basal rates: 12 am: 1.5 3 am:  1.5 >> 1.6 5 am: 1. 6 >> 1.7 9 am: 1.3 1 pm: 1.4 -  ICR: 4 - target: 100-100 - ISF: 35 - Insulin on Board: 4h  ENTER ALL CARBS AND SUGARS IN THE PUMP.  Schedule an appt with Bonita Quin when you get the new pump.  Please continue Levothyroxine 200 mcg daily.  Take the thyroid hormone every day, with water, at least 30 minutes before breakfast, separated by at least 4 hours from: - acid reflux medications - calcium - iron - multivitamins  Please stop at the lab.  Please come back for a follow-up appointment in 3 months.  - today, HbA1c is 7.3% (higher) - continue checking sugars at different times of the day - check 4x a day, rotating checks - advised for yearly eye exams >> he is not UTD - + flu shot today - Return to clinic in 3 mo with sugar log     2. Post-ablative hypothyroidism -Uncontrolled - latest thyroid labs (from last visit) reviewed with pt >> TSH was high, so we increase his levothyroxine - he continues on LT4 200 mcg daily - pt feels good on this dose. - we discussed about taking the thyroid hormone every day, with water, >30 minutes before breakfast, separated by >4 hours from acid reflux medications, calcium, iron, multivitamins. Pt. is taking it correctly. - will check thyroid tests today: TSH and fT4 - If labs are abnormal, he will need to return for repeat TFTs in 1.5 months  3.Hyperlipidemia  - Reviewed latest lipid panel from 09/2017: LDL improved, but still above target Lab Results  Component Value Date   CHOL 168 10/22/2017   HDL 42.80 10/22/2017   LDLCALC 113 (H) 03/10/2017   LDLDIRECT 106.0 10/22/2017   TRIG 231.0 (H) 10/22/2017   CHOLHDL 4 10/22/2017  - Continues atorvastatin 20 mg daily, started at last visit, without side effects.  - time spent with the patient: 40 min, of which >50% was spent in reviewing his pump and meter downloads, discussing his hypo- and hyper-glycemic episodes, reviewing previous labs and pump settings and developing a plan to avoid hypo- and hyper-glycemia.  We also  addressed his hyperlipidemia and hypothyroidism.  Office Visit on 08/19/2018  Component Date Value Ref Range Status  . Hemoglobin A1C 08/19/2018 7.3* 4.0 - 5.6 % Final  . Microalb, Ur 08/19/2018 <0.7  0.0 - 1.9 mg/dL Final  . Creatinine,U 16/06/9603 46.4  mg/dL Final  . Microalb Creat Ratio 08/19/2018 1.5  0.0 - 30.0 mg/g Final  . Glucose, Bld 08/19/2018 125* 65 - 99 mg/dL Final   Comment: .            Fasting reference interval . For someone without known diabetes, a glucose value between 100 and 125 mg/dL is consistent with prediabetes and should be confirmed with a follow-up test. .   . BUN 08/19/2018 12  7 - 25 mg/dL Final  . Creat 54/05/8118 1.17  0.70 - 1.33 mg/dL Final   Comment: For patients >68 years of age, the reference limit for Creatinine is approximately 13% higher for people identified as African-American. .   . GFR, Est Non African American 08/19/2018 72  > OR = 60 mL/min/1.32m2 Final  . GFR, Est African American 08/19/2018 84  > OR = 60 mL/min/1.20m2 Final  . BUN/Creatinine Ratio 08/19/2018 NOT APPLICABLE  6 - 22 (calc) Final  .  Sodium 08/19/2018 139  135 - 146 mmol/L Final  . Potassium 08/19/2018 4.0  3.5 - 5.3 mmol/L Final  . Chloride 08/19/2018 100  98 - 110 mmol/L Final  . CO2 08/19/2018 33* 20 - 32 mmol/L Final  . Calcium 08/19/2018 9.3  8.6 - 10.3 mg/dL Final  . Total Protein 08/19/2018 6.9  6.1 - 8.1 g/dL Final  . Albumin 40/98/119111/21/2019 4.5  3.6 - 5.1 g/dL Final  . Globulin 47/82/956211/21/2019 2.4  1.9 - 3.7 g/dL (calc) Final  . AG Ratio 08/19/2018 1.9  1.0 - 2.5 (calc) Final  . Total Bilirubin 08/19/2018 0.5  0.2 - 1.2 mg/dL Final  . Alkaline phosphatase (APISO) 08/19/2018 76  40 - 115 U/L Final  . AST 08/19/2018 19  10 - 35 U/L Final  . ALT 08/19/2018 15  9 - 46 U/L Final  . TSH 08/19/2018 5.26* 0.35 - 4.50 uIU/mL Final  . Free T4 08/19/2018 1.11  0.60 - 1.60 ng/dL Final   Comment: Specimens from patients who are undergoing biotin therapy and /or ingesting  biotin supplements may contain high levels of biotin.  The higher biotin concentration in these specimens interferes with this Free T4 assay.  Specimens that contain high levels  of biotin may cause false high results for this Free T4 assay.  Please interpret results in light of the total clinical presentation of the patient.    . Cholesterol 08/19/2018 135  0 - 200 mg/dL Final   ATP III Classification       Desirable:  < 200 mg/dL               Borderline High:  200 - 239 mg/dL          High:  > = 130240 mg/dL  . Triglycerides 08/19/2018 184.0* 0.0 - 149.0 mg/dL Final   Normal:  <865<150 mg/dLBorderline High:  150 - 199 mg/dL  . HDL 08/19/2018 39.90  >39.00 mg/dL Final  . VLDL 78/46/962911/21/2019 36.8  0.0 - 40.0 mg/dL Final  . LDL Cholesterol 08/19/2018 58  0 - 99 mg/dL Final  . Total CHOL/HDL Ratio 08/19/2018 3   Final                  Men          Women1/2 Average Risk     3.4          3.3Average Risk          5.0          4.42X Average Risk          9.6          7.13X Average Risk          15.0          11.0                      . NonHDL 08/19/2018 94.67   Final   NOTE:  Non-HDL goal should be 30 mg/dL higher than patient's LDL goal (i.e. LDL goal of < 70 mg/dL, would have non-HDL goal of < 100 mg/dL)   Labs are OK. TSH much better >> will not change LT4 dose and repeat TFTs at next visit.  Carlus Pavlovristina Kidada Ging, MD PhD [facility]

## 2018-08-20 ENCOUNTER — Encounter: Payer: Self-pay | Admitting: Internal Medicine

## 2018-08-20 LAB — LIPID PANEL
Cholesterol: 135 mg/dL (ref 0–200)
HDL: 39.9 mg/dL (ref >39.00)
LDL Cholesterol: 58 mg/dL (ref 0–99)
NONHDL: 94.67
Total CHOL/HDL Ratio: 3
Triglycerides: 184 mg/dL — ABNORMAL HIGH (ref 0.0–149.0)
VLDL: 36.8 mg/dL (ref 0.0–40.0)

## 2018-08-20 LAB — T4, FREE: FREE T4: 1.11 ng/dL (ref 0.60–1.60)

## 2018-08-20 LAB — TSH: TSH: 5.26 u[IU]/mL — AB (ref 0.35–4.50)

## 2018-09-01 ENCOUNTER — Telehealth: Payer: Self-pay | Admitting: Internal Medicine

## 2018-09-01 NOTE — Telephone Encounter (Signed)
Confirmed with [employer] we received documentation for this patient via fax.

## 2018-09-02 DIAGNOSIS — E1065 Type 1 diabetes mellitus with hyperglycemia: Secondary | ICD-10-CM | POA: Diagnosis not present

## 2018-09-20 DIAGNOSIS — E1065 Type 1 diabetes mellitus with hyperglycemia: Secondary | ICD-10-CM | POA: Diagnosis not present

## 2018-10-18 ENCOUNTER — Telehealth: Payer: Self-pay | Admitting: Internal Medicine

## 2018-10-18 NOTE — Telephone Encounter (Signed)
Per [facility] "Caller states he has an abscessed tooth and he is trying to get an antibiotic, but he has to have a prescription. He is calling to see if he can get that called in."

## 2018-10-18 NOTE — Telephone Encounter (Signed)
LM for patient

## 2018-10-18 NOTE — Telephone Encounter (Signed)
Per PCP 

## 2018-10-18 NOTE — Telephone Encounter (Signed)
Please advise 

## 2018-11-30 DIAGNOSIS — E109 Type 1 diabetes mellitus without complications: Secondary | ICD-10-CM | POA: Diagnosis not present

## 2018-11-30 DIAGNOSIS — E1065 Type 1 diabetes mellitus with hyperglycemia: Secondary | ICD-10-CM | POA: Diagnosis not present

## 2018-11-30 DIAGNOSIS — Z794 Long term (current) use of insulin: Secondary | ICD-10-CM | POA: Diagnosis not present

## 2018-12-02 ENCOUNTER — Ambulatory Visit: Payer: BLUE CROSS/BLUE SHIELD | Admitting: Internal Medicine

## 2018-12-02 ENCOUNTER — Encounter: Payer: Self-pay | Admitting: Internal Medicine

## 2018-12-02 ENCOUNTER — Other Ambulatory Visit: Payer: Self-pay

## 2018-12-02 VITALS — BP 138/98 | HR 84 | Ht 75.0 in | Wt 241.0 lb

## 2018-12-02 DIAGNOSIS — E890 Postprocedural hypothyroidism: Secondary | ICD-10-CM | POA: Diagnosis not present

## 2018-12-02 DIAGNOSIS — E1065 Type 1 diabetes mellitus with hyperglycemia: Secondary | ICD-10-CM | POA: Diagnosis not present

## 2018-12-02 DIAGNOSIS — E785 Hyperlipidemia, unspecified: Secondary | ICD-10-CM | POA: Diagnosis not present

## 2018-12-02 LAB — POCT GLYCOSYLATED HEMOGLOBIN (HGB A1C): Hemoglobin A1C: 7.4 % — AB (ref 4.0–5.6)

## 2018-12-02 LAB — T4, FREE: FREE T4: 1.25 ng/dL (ref 0.60–1.60)

## 2018-12-02 LAB — TSH: TSH: 2.14 u[IU]/mL (ref 0.35–4.50)

## 2018-12-02 MED ORDER — INSULIN LISPRO 100 UNIT/ML ~~LOC~~ SOLN: SUBCUTANEOUS | 3 refills | Status: DC

## 2018-12-02 MED ORDER — DEXCOM G6 TRANSMITTER MISC: 1.0000 | 3 refills | Status: DC

## 2018-12-02 MED ORDER — DEXCOM G6 RECEIVER DEVI: 1.0000 | Freq: Once | 0 refills | Status: AC

## 2018-12-02 MED ORDER — DEXCOM G6 SENSOR MISC: 1.0000 | 3 refills | Status: DC

## 2018-12-02 NOTE — Addendum Note (Signed)
Addended by: Darliss Ridgel I on: 12/02/2018 02:42 PM   Modules accepted: Orders

## 2018-12-02 NOTE — Patient Instructions (Addendum)
Please use the following pump settings: - basal rates: 12 am: 1.6 3 am:  1.6 5 am: 1.7 9 am: 1.3 1 pm: 1.4 - ICR: 4, but 3.5 with starches if increasing the carb entry for the meal does not help - target: 100-100 - ISF: 35 - Insulin on Board: 4h  ENTER ALL SUGARS IN THE PUMP.  Please continue Levothyroxine  200 mcg daily.  Take the thyroid hormone every day, with water, at least 30 minutes before breakfast, separated by at least 4 hours from: - acid reflux medications - calcium - iron - multivitamins  Please stop at the lab.  Please come back for a follow-up appointment in 3-4 months.

## 2018-12-02 NOTE — Progress Notes (Signed)
Patient ID: Willie Spencer, male   DOB: 09-03-1966, 53 y.o.   MRN: [id number]   HPI: Willie Spencer is a 53 y.o.-year-old male, returning for f/u for DM1, dx 1987, uncontrolled, without complications and post ablative hypothyroidism. He saw Dr. Gabriel Carina in the past, last visit 2015.  Last visit with me 4 months ago.  He is on an insulin pump: Omni pod, started 09/16/2016.  Uses Humalog in the pump.  At last visit, he was telling me that his pump was giving him errors.  He wanted to switch to the Medtronic 670 G pump, but he is now on [employer].  This is now not covered by his insurance so he needs to go back to the previous regular Omni pod pump. He will also need to go through [facility] for supplies.  DM1: Last hemoglobin A1c was: Lab Results  Component Value Date   HGBA1C 7.3 (A) 08/19/2018   HGBA1C 7.0 01/20/2018   HGBA1C 7.7 10/22/2017   He was on a regimen of: - Lantus 40 units at dinnertime - Humalog 10-15 units 3x a day - AC  Pump settings: - basal rates: 12 am: 1.5 >> 1.6 (he changed his since last visit) 3 am:  1.5 >> 1.6 5 am: 1. 6 >> 1.7 9 am: 1.3 1 pm: 1.4 - ICR: 4 - target: 100-100 - ISF: 35 - Insulin on Board: 4h TDD from basal insulin: 50% >> 46% (32 units) >> 47% TDD from bolus insulin: 50% >> 54% (37 units) >> 53% Total daily dose 60.7 units >> up to 80 units - extended bolusing: not using - changes infusion site: q3 days  Pt checks his sugars 2x a day:  Lowest sugar was 30s (wife called EMS - ~2013) >> 61 >> 37 (after a manual bolus) >> 52 >> 49 >> 50s >> 70; he has hypoglycemia awareness in the 70s.  No previous hypoglycemia or DKA admissions.  He does have a glucagon pen at home. Highest sugar was 300 >> 306 >> 310.  Pt's meals are: - Breakfast: sausage biscuits, orange crackers - Lunch: sandwich; fast food - Dinner: meat + veggies + starch - Snacks: at bedtime; 3: fruit, PB crackers, nuts  -No CKD, last BUN/creatinine:  Lab Results  Component Value  Date   BUN 12 08/19/2018   BUN 14 10/22/2017   CREATININE 1.17 08/19/2018   CREATININE 1.09 10/22/2017  On losartan.  -+ HL; latest set of lipids: Lab Results  Component Value Date   CHOL 135 08/19/2018   HDL 39.90 08/19/2018   LDLCALC 58 08/19/2018   LDLDIRECT 106.0 10/22/2017   TRIG 184.0 (H) 08/19/2018   CHOLHDL 3 08/19/2018  On pravastatin 20.  - last eye exam was 2015: No DR  -He denies numbness and tingling in his feet.  Post ablative hypothyroidism: - History of Graves' disease, s/p RAI treatment 07/2012  He had very fluctuating TFTs in the past: Lab Results  Component Value Date   TSH 5.26 (H) 08/19/2018   TSH 11.52 (H) 01/20/2018   TSH 18.25 (H) 10/22/2017   TSH 7.52 (H) 03/10/2017   TSH 25.34 (H) 10/27/2016   TSH 8.46 (H) 07/30/2016   TSH 61.14 (H) 01/29/2016   TSH 1.697 07/30/2015   FREET4 1.11 08/19/2018   FREET4 0.97 01/20/2018   FREET4 0.90 10/22/2017   FREET4 0.97 03/10/2017   FREET4 0.42 (L) 10/27/2016   FREET4 1.2 07/30/2016   Pt is on levothyroxine 200 mcg daily, taken: - in  am - fasting - at least 30 min from b'fast - no Ca, Fe, PPIs - not on Biotin - + MVIs at night  Pt denies: - feeling nodules in neck - hoarseness - dysphagia - choking - SOB with lying down  ROS: Constitutional: no weight gain/no weight loss, no fatigue, no subjective hyperthermia, no subjective hypothermia Eyes: no blurry vision, no xerophthalmia ENT: no sore throat, + see HPI Cardiovascular: no CP/no SOB/no palpitations/no leg swelling Respiratory: no cough/no SOB/no wheezing Gastrointestinal: no N/no V/no D/no C/no acid reflux Musculoskeletal: no muscle aches/no joint aches Skin: no rashes, no hair loss Neurological: no tremors/no numbness/no tingling/no dizziness  I reviewed pt's medications, allergies, PMH, social hx, family hx, and changes were documented in the history of present illness. Otherwise, unchanged from my initial visit note.  Past Medical  History:  Diagnosis Date  . Diabetes mellitus without complication ([place])   . Hypertension   . Thyroid disease    No past surgical history on file. Social History   Social History  . Marital status: Married    Spouse name: N/A  . Number of children: 2   Occupational History  .  Maintenance supervisor   Social History Main Topics  . Smoking status: Current Every Day Smoker    Packs/day: 1.00    Types: Cigarettes    Last attempt to quit: 08/30/2015  . Smokeless tobacco: Never Used     Comment: uses ecigs  . Alcohol use      Comment: 6 beers per weekend  . Drug use: No   Current Outpatient Medications on File Prior to Visit  Medication Sig Dispense Refill  . atorvastatin (LIPITOR) 20 MG tablet Take 1 tablet (20 mg total) by mouth daily. 90 tablet 3  . glucose blood (CONTOUR NEXT TEST) test strip Use as instructed to test sugars 4 times daily 400 each 3  . Insulin Disposable Pump (OMNIPOD) MISC Use 1 every 3 days.  Dx- E10.65 30 each 5  . insulin lispro (HUMALOG KWIKPEN) 100 UNIT/ML KiwkPen Inject 0.1-0.15 mLs (10-15 Units total) into the skin 3 (three) times daily. 15 mL 11  . insulin lispro (HUMALOG) 100 UNIT/ML injection USE UP TO 70 UNITS PER DAY IN THE INSULIN PUMP. 30 mL 10  . Insulin Pen Needle (PEN NEEDLES) 31G X 6 MM MISC Use with insulin pen 100 each 1  . levothyroxine (SYNTHROID, LEVOTHROID) 200 MCG tablet Take 1 tablet (200 mcg total) by mouth daily before breakfast. 90 tablet 3  . losartan (COZAAR) 25 MG tablet TAKE 1 TABLET BY MOUTH DAILY (Patient not taking: Reported on 08/19/2018) 30 tablet 1  . ranitidine (ZANTAC) 150 MG tablet Take 1 tablet (150 mg total) by mouth 2 (two) times daily. (Patient not taking: Reported on 08/19/2018) 20 tablet 0   No current facility-administered medications on file prior to visit.    Allergies  Allergen Reactions  . Wellbutrin [Bupropion]     Gum swelling  . Levemir [Insulin Detemir] Rash   Family History  Problem Relation Age  of Onset  . Diabetes Paternal Grandfather   . Stroke Paternal Grandfather   . Heart disease Paternal Grandfather     PE: BP (!) 138/98   Pulse 84   Ht _0  (1.905 m)   Wt 241 lb (109.3 kg)   SpO2 98%   BMI 30.12 kg/m  Wt Readings from Last 3 Encounters:  12/02/18 241 lb (109.3 kg)  08/19/18 232 lb (105.2 kg)  01/20/18 238 lb (108  kg)   Constitutional: overweight, in NAD Eyes: PERRLA, EOMI, no exophthalmos ENT: moist mucous membranes, no thyromegaly, no cervical lymphadenopathy Cardiovascular: RRR, No MRG Respiratory: CTA B Gastrointestinal: abdomen soft, NT, ND, BS+ Musculoskeletal: no deformities, strength intact in all 4 Skin: moist, warm, no rashes Neurological: no tremor with outstretched hands, DTR normal in all 4  ASSESSMENT: 1. DM1, uncontrolled, without Long term complications, but with hyperglycemia  2. Post-ablative hypothyroidism  3.  Hyperlipidemia  PLAN:  1. Patient with longstanding, uncontrolled, type 1 diabetes, on an insulin pump.  He was on an Omni pod insulin pump at last visit but this was giving him errors and we decided to switch to a Medtronic 670 G pump -At last visit, sugars were better, but still high in the morning.  He was not checking sugars after dinner and I advised him to start doing so.  He was introducing less carbs with dinner and sugars are higher especially after this meal.  He usually eats a lot for dinner.  We did not change his insulin to carb ratio at that time, but since sugars are higher in the morning we increased his basal rates from 3 AM to 9 AM. -At this visit, sugars are fairly well-controlled in the first half of the day, but they are higher around dinnertime.  He mentions that he cannot estimate the carbs in starchy meals (pasta, mashed potatoes, cookies) very well.  For example, after mac & cheese, his sugars increased to 286 and he even had a higher reading of 310 after a similar meal.  We discussed about the fact that he  probably needs to enter more carbs in the pump (currently, he enters 30-40).  I advised him how to do an insulin to carb ratio validation for dinner.  He will try to do so.  I also advised him that if he enters the right amount of carbs and his sugars are still high after dinner, then he will need to decrease his insulin to carb ratio for this meal. -He is also not introducing all the sugars in the past especially in the first half of the day (fasting).  I advised him to do so. -He now has a Medtronic sensor, but since he cannot switch to Medtronic insulin pump, we discussed about starting a Dexcom CGM and I sent a prescription for a Dexcom G6 to his pharmacy.  This communicates with the Omni pod Dash, however, unfortunately, he needs to go back to the previous Omni pod, which does not communicate with the sensor.  Regardless, he would still benefit from the addition of the sensor. -Overall, sugars do appear improved from before especially as he does not have any more severe lows. -I recommended to: Patient Instructions  Please use the following pump settings: - basal rates: 12 am: 1. 6 3 am:  1.6 5 am: 1. 7 9 am: 1.3 1 pm: 1.4 - ICR: 4, but 3.5 with starches if increasing the carb entry for the meal does not help - target: 100-100 - ISF: 35 - Insulin on Board: 4h  ENTER ALL SUGARS IN THE PUMP.  Please continue Levothyroxine  200 mcg daily.  Take the thyroid hormone every day, with water, at least 30 minutes before breakfast, separated by at least 4 hours from: - acid reflux medications - calcium - iron - multivitamins  Please stop at the lab.  Please come back for a follow-up appointment in 3-4 months.  - today, HbA1c is 7.4% (slightly higher) -  continue checking sugars at different times of the day - check >4x a day, rotating checks - advised for yearly eye exams >> he is not UTD - Return to clinic in 3-4 mo with sugar log      2. Post-ablative  hypothyroidism -Uncontrolled - latest thyroid labs reviewed with pt >> not at goal, but much improved - he continues on LT4 200 mcg daily - pt feels good on this dose. - we discussed about taking the thyroid hormone every day, with water, >30 minutes before breakfast, separated by >4 hours from acid reflux medications, calcium, iron, multivitamins. Pt. is taking it correctly. - will check thyroid tests today: TSH and fT4 - If labs are abnormal, he will need to return for repeat TFTs in 1.5 months  3.Hyperlipidemia  - Reviewed latest lipid panel from 07/2018: LDL much improved, triglycerides slightly high Lab Results  Component Value Date   CHOL 135 08/19/2018   HDL 39.90 08/19/2018   LDLCALC 58 08/19/2018   LDLDIRECT 106.0 10/22/2017   TRIG 184.0 (H) 08/19/2018   CHOLHDL 3 08/19/2018  - Continues Lipitor 20 without side effects.  Office Visit on 12/02/2018  Component Date Value Ref Range Status  . TSH 12/02/2018 2.14  0.35 - 4.50 uIU/mL Final  . Free T4 12/02/2018 1.25  0.60 - 1.60 ng/dL Final   Comment: Specimens from patients who are undergoing biotin therapy and /or ingesting biotin supplements may contain high levels of biotin.  The higher biotin concentration in these specimens interferes with this Free T4 assay.  Specimens that contain high levels  of biotin may cause false high results for this Free T4 assay.  Please interpret results in light of the total clinical presentation of the patient.     Thyroid tests are now normal.  Philemon Kingdom, MD PhD [facility] Endocrin[facility]

## 2018-12-03 ENCOUNTER — Telehealth: Payer: Self-pay

## 2018-12-03 MED ORDER — INSULIN ASPART (W/NIACINAMIDE) 100 UNIT/ML ~~LOC~~ SOLN: 80.0000 [IU] | Freq: Every day | SUBCUTANEOUS | 3 refills | Status: DC

## 2018-12-03 NOTE — Telephone Encounter (Signed)
RX sent

## 2018-12-03 NOTE — Telephone Encounter (Signed)
The allergy is for Levemir, so we are fine.

## 2018-12-03 NOTE — Telephone Encounter (Signed)
Insurance does not cover the Humalog for his pump and advised Fiasp.  There is an allergy alert. Please review and advise if ok to send.

## 2018-12-21 ENCOUNTER — Other Ambulatory Visit: Payer: Self-pay | Admitting: Internal Medicine

## 2018-12-27 DIAGNOSIS — E109 Type 1 diabetes mellitus without complications: Secondary | ICD-10-CM | POA: Diagnosis not present

## 2018-12-27 DIAGNOSIS — Z794 Long term (current) use of insulin: Secondary | ICD-10-CM | POA: Diagnosis not present

## 2018-12-27 DIAGNOSIS — E1065 Type 1 diabetes mellitus with hyperglycemia: Secondary | ICD-10-CM | POA: Diagnosis not present

## 2019-01-26 DIAGNOSIS — E109 Type 1 diabetes mellitus without complications: Secondary | ICD-10-CM | POA: Diagnosis not present

## 2019-01-26 DIAGNOSIS — E1065 Type 1 diabetes mellitus with hyperglycemia: Secondary | ICD-10-CM | POA: Diagnosis not present

## 2019-01-26 DIAGNOSIS — Z794 Long term (current) use of insulin: Secondary | ICD-10-CM | POA: Diagnosis not present

## 2019-02-18 ENCOUNTER — Telehealth: Payer: Self-pay | Admitting: Internal Medicine

## 2019-02-18 MED ORDER — PEN NEEDLES 31G X 6 MM MISC: 1 refills | Status: DC

## 2019-02-18 MED ORDER — INSULIN LISPRO (1 UNIT DIAL) 100 UNIT/ML (KWIKPEN): 10.0000 [IU] | PEN_INJECTOR | Freq: Three times a day (TID) | SUBCUTANEOUS | 11 refills | Status: DC

## 2019-02-18 NOTE — Telephone Encounter (Signed)
Both have been sent. 

## 2019-02-18 NOTE — Telephone Encounter (Signed)
MEDICATION: insulin lispro (HUMALOG KWIKPEN) 100 UNIT/ML KiwkPen, AND the needles for Humalog Kwikpen  PHARMACY:   [id number] - [place], [place] - [address] [phone] (Phone) [phone] (Fax)    IS THIS A 90 DAY SUPPLY : Yes  IS PATIENT OUT OF MEDICATION: Yes  IF NOT; HOW MUCH IS LEFT: 0  LAST APPOINTMENT DATE: @3 /24/2020  NEXT APPOINTMENT DATE:@6 /30/2020  DO WE HAVE YOUR PERMISSION TO LEAVE A DETAILED MESSAGE: Yes  OTHER COMMENTS:    **Let patient know to contact pharmacy at the end of the day to make sure medication is ready. **  ** Please notify patient to allow 48-72 hours to process**  **Encourage patient to contact the pharmacy for refills or they can request refills through [facility]**

## 2019-02-22 ENCOUNTER — Other Ambulatory Visit: Payer: Self-pay

## 2019-02-22 ENCOUNTER — Telehealth: Payer: Self-pay

## 2019-02-22 MED ORDER — INSULIN PEN NEEDLE 32G X 6 MM MISC: 4 refills | Status: DC

## 2019-02-22 NOTE — Telephone Encounter (Signed)
Okay to change to NovoLog, the same amount

## 2019-02-22 NOTE — Telephone Encounter (Signed)
Humalog is no longer covered under patient insurance.  Please advise if we need to send a PA.

## 2019-02-23 ENCOUNTER — Other Ambulatory Visit: Payer: Self-pay | Admitting: Internal Medicine

## 2019-02-23 MED ORDER — INSULIN ASPART 100 UNIT/ML FLEXPEN: PEN_INJECTOR | SUBCUTANEOUS | 11 refills | Status: DC

## 2019-02-23 NOTE — Telephone Encounter (Signed)
Novolog sent

## 2019-03-22 ENCOUNTER — Telehealth: Payer: Self-pay | Admitting: Internal Medicine

## 2019-03-22 DIAGNOSIS — E1065 Type 1 diabetes mellitus with hyperglycemia: Secondary | ICD-10-CM | POA: Diagnosis not present

## 2019-03-22 DIAGNOSIS — Z794 Long term (current) use of insulin: Secondary | ICD-10-CM | POA: Diagnosis not present

## 2019-03-22 DIAGNOSIS — E109 Type 1 diabetes mellitus without complications: Secondary | ICD-10-CM | POA: Diagnosis not present

## 2019-03-22 NOTE — Telephone Encounter (Signed)
Patient is taking Omnipod and the RX he has now does not last him 30 days.  He need assistance with either a more comprehensive RX or a change in medication so that his medicine lasts 30 days  Call back number [phone]

## 2019-03-22 NOTE — Telephone Encounter (Signed)
Please review Rx and advise 

## 2019-03-23 NOTE — Telephone Encounter (Signed)
I am assuming this is in regards of his insulin.  I sent 9 bottles of insulin with 3 refills in March.  This should have been enough, if he actually got them.  Can you please clarify with him?  If not, okay to resend again.  Also, please take off the Fiasp from his medication list.

## 2019-03-25 ENCOUNTER — Other Ambulatory Visit: Payer: Self-pay

## 2019-03-25 NOTE — Addendum Note (Signed)
Addended by: Cardell Peach I on: 03/25/2019 01:06 PM   Modules accepted: Orders

## 2019-03-29 ENCOUNTER — Other Ambulatory Visit: Payer: Self-pay

## 2019-03-29 ENCOUNTER — Encounter: Payer: Self-pay | Admitting: Internal Medicine

## 2019-03-29 ENCOUNTER — Ambulatory Visit (INDEPENDENT_AMBULATORY_CARE_PROVIDER_SITE_OTHER): Payer: BC Managed Care – PPO | Admitting: Internal Medicine

## 2019-03-29 VITALS — BP 128/88 | HR 90 | Ht 75.0 in | Wt 237.0 lb

## 2019-03-29 DIAGNOSIS — E785 Hyperlipidemia, unspecified: Secondary | ICD-10-CM | POA: Diagnosis not present

## 2019-03-29 DIAGNOSIS — E890 Postprocedural hypothyroidism: Secondary | ICD-10-CM | POA: Diagnosis not present

## 2019-03-29 DIAGNOSIS — E1065 Type 1 diabetes mellitus with hyperglycemia: Secondary | ICD-10-CM

## 2019-03-29 LAB — POCT GLYCOSYLATED HEMOGLOBIN (HGB A1C): Hemoglobin A1C: 7.6 % — AB (ref 4.0–5.6)

## 2019-03-29 MED ORDER — GLUCAGON HCL (RDNA) 1 MG IJ SOLR: 1.0000 mg | Freq: Once | INTRAMUSCULAR | 11 refills | Status: DC | PRN

## 2019-03-29 NOTE — Progress Notes (Signed)
Patient ID: Willie Spencer, male   DOB: [birth year], 53 y.o.   MRN: [id number]   HPI: Willie Spencer is a 53 y.o.-year-old male, returning for f/u for DM1, dx 1987, uncontrolled, without complications and post ablative hypothyroidism. He saw Dr. Gabriel Spencer in the past, last visit 2015.  Last visit with me  almost 4 months ago.  He is on an insulin pump, Omni pod, started 09/16/2016.  Uses FiASP in the pump - changed from Humalog since last OV.    At last visit, he was on Omni pod DASH but this became not covered by his insurance so he needed to go back to his regular OmniPod pump.  He gets supplies through [employer].  He would like to get on the Medtronic 670 G.   At last visit I sent a prescription to his pharmacy for Dexcom G6, he had but he did not get this that - he forgot.  DM1: Last hemoglobin A1c was: Lab Results  Component Value Date   HGBA1C 7.4 (A) 12/02/2018   HGBA1C 7.3 (A) 08/19/2018   HGBA1C 7.0 01/20/2018   He was on a regimen of: - Lantus 40 units at dinnertime - Humalog 10-15 units 3x a day   Pump settings: - basal rates: 12 am: 1.5 >> 1.6 3 am:  1.5 >> 1.6 5 am: 1. 6 >> 1.7 9 am: 1.3 1 pm: 1.4 - ICR: 4 - target: 100-100 - ISF: 35 - Insulin on Board: 4h TDD from basal insulin: 50% >> 46% (32 units) >> 47% >> 44% TDD from bolus insulin: 50% >> 54% (37 units) >> 53% >> 56% Total daily dose 60.7 units >> up to 80 units - extended bolusing: Not using - changes infusion site: Every 3 days  Pt checks his sugars twice a day: - am: 150 - 2h after b'fast: n/c - lunch: n/c - 2h after lunch: n/c - dinner:  107, 118-243 - 2h after dinner: 128-218, 371 - bedtime: see above - nighttime: not checking  Lowest sugar was 30s (wife called EMS - ~2013) >> 61 >> 37 (after a manual bolus) >> 52 >> 49 >> 50s >> 70 >> 60; he has hypoglycemia awareness in the 70s.  No previous hypoglycemia or DKA admissions.  He has a glucagon pen at home. Highest sugar was 310 >> 400 (ran out of  insulin in the pod).  Pt's meals are: - Breakfast: sausage biscuits, orange crackers - Lunch: sandwich; fast food - Dinner: meat + veggies + starch - Snacks: at bedtime; 3: fruit, PB crackers, nuts  -No history of CKD, last BUN/creatinine:  Lab Results  Component Value Date   BUN 12 08/19/2018   BUN 14 10/22/2017   CREATININE 1.17 08/19/2018   CREATININE 1.09 10/22/2017  On losartan.  -+ HL; latest set of lipids: Lab Results  Component Value Date   CHOL 135 08/19/2018   HDL 39.90 08/19/2018   LDLCALC 58 08/19/2018   LDLDIRECT 106.0 10/22/2017   TRIG 184.0 (H) 08/19/2018   CHOLHDL 3 08/19/2018  On pravastatin 20.  - last eye exam was 2018: No DR  - no numbness and tingling in his feet.  Post ablative hypothyroidism: - History of Graves' disease, status post RAI treatment 07/2016  He continues to have fluctuating TFTs: Lab Results  Component Value Date   TSH 2.14 12/02/2018   TSH 5.26 (H) 08/19/2018   TSH 11.52 (H) 01/20/2018   TSH 18.25 (H) 10/22/2017   TSH 7.52 (H) 03/10/2017  TSH 25.34 (H) 10/27/2016   TSH 8.46 (H) 07/30/2016   TSH 61.14 (H) 01/29/2016   TSH 1.697 07/30/2015   FREET4 1.25 12/02/2018   FREET4 1.11 08/19/2018   FREET4 0.97 01/20/2018   FREET4 0.90 10/22/2017   FREET4 0.97 03/10/2017   FREET4 0.42 (L) 10/27/2016   FREET4 1.2 07/30/2016   Pt is on levothyroxine 200 mcg daily, taken: - in am - fasting - at least 30 min from b'fast - no Ca, Fe, PPIs - + Multivitamins at night - not on Biotin - On vit C and Zn  Pt denies: - feeling nodules in neck - hoarseness - dysphagia - choking - SOB with lying down  ROS: Constitutional: no weight gain/no weight loss, no fatigue, no subjective hyperthermia, no subjective hypothermia Eyes: no blurry vision, no xerophthalmia ENT: no sore throat, + see HPI Cardiovascular: no CP/no SOB/no palpitations/no leg swelling Respiratory: no cough/no SOB/no wheezing Gastrointestinal: no N/no V/no D/no  C/no acid reflux Musculoskeletal: no muscle aches/no joint aches Skin: no rashes, no hair loss Neurological: no tremors/no numbness/no tingling/no dizziness  I reviewed pt's medications, allergies, PMH, social hx, family hx, and changes were documented in the history of present illness. Otherwise, unchanged from my initial visit note.  Past Medical History:  Diagnosis Date  . Diabetes mellitus without complication ([facility])   . Hypertension   . Thyroid disease    No past surgical history on file. Social History   Social History  . Marital status: Married    Spouse name: N/A  . Number of children: 2   Occupational History  .  Maintenance supervisor   Social History Main Topics  . Smoking status: Current Every Day Smoker    Packs/day: 1.00    Types: Cigarettes    Last attempt to quit: 08/30/2015  . Smokeless tobacco: Never Used     Comment: uses ecigs  . Alcohol use      Comment: 6 beers per weekend  . Drug use: No   Current Outpatient Medications on File Prior to Visit  Medication Sig Dispense Refill  . atorvastatin (LIPITOR) 20 MG tablet TAKE 1 TABLET BY MOUTH EVERY DAY 90 tablet 3  . Continuous Blood Gluc Transmit (DEXCOM G6 TRANSMITTER) MISC 1 Device by Does not apply route every 3 (three) months. 1 each 3  . glucose blood (CONTOUR NEXT TEST) test strip Use as instructed to test sugars 4 times daily 400 each 3  . insulin aspart (NOVOLOG FLEXPEN) 100 UNIT/ML FlexPen Use to inject 10-15 units 3 times a day. 15 mL 11  . Insulin Disposable Pump (OMNIPOD) MISC Use 1 every 3 days.  Dx- E10.65 30 each 5  . Insulin Pen Needle 32G X 6 MM MISC To use with insulin pen. 100 each 4  . levothyroxine (SYNTHROID) 200 MCG tablet TAKE 1 TABLET (200 MCG TOTAL) BY MOUTH DAILY BEFORE BREAKFAST. 90 tablet 3  . losartan (COZAAR) 25 MG tablet TAKE 1 TABLET BY MOUTH DAILY (Patient not taking: Reported on 12/02/2018) 30 tablet 1  . ranitidine (ZANTAC) 150 MG tablet Take 1 tablet (150 mg total) by mouth  2 (two) times daily. 20 tablet 0   No current facility-administered medications on file prior to visit.    Allergies  Allergen Reactions  . Wellbutrin [Bupropion]     Gum swelling  . Levemir [Insulin Detemir] Rash   Family History  Problem Relation Age of Onset  . Diabetes Paternal Grandfather   . Stroke Paternal Grandfather   .  Heart disease Paternal Grandfather    PE: BP 128/88   Pulse 90   Ht 6\' 3"  (1.905 m)   Wt 237 lb (107.5 kg)   SpO2 98%   BMI 29.62 kg/m  Wt Readings from Last 3 Encounters:  03/29/19 237 lb (107.5 kg)  12/02/18 241 lb (109.3 kg)  08/19/18 232 lb (105.2 kg)   Constitutional: overweight, in NAD Eyes: PERRLA, EOMI, no exophthalmos ENT: moist mucous membranes, no thyromegaly, no cervical lymphadenopathy Cardiovascular: RRR, No MRG Respiratory: CTA B Gastrointestinal: abdomen soft, NT, ND, BS+ Musculoskeletal: no deformities, strength intact in all 4 Skin: moist, warm, no rashes Neurological: no tremor with outstretched hands, DTR normal in all 4  ASSESSMENT: 1. DM1, uncontrolled, without Long term complications, but with hyperglycemia  2. Post-ablative hypothyroidism  3.  Hyperlipidemia  PLAN:  1. Patient with longstanding, uncontrolled, type 1 diabetes, on insulin pump.  She is on an Omni pod insulin pump and is now on Dexcom G6.  This is communicating with the OmniPod Dash, however, unfortunately, he had to go back to the regular Omnipod since the OmniPod Dash was not covered.  He would have preferred to switch to the Medtronic 670 G pump, but this was not covered by his insurance. -At last visit, sugars appeared improved from before especially since he was not having any more severe lows.  However, he was not introducing all of his sugars into the pump, and I strongly advised him to do so.  Sugars were better in the first half of the day but still high after dinner and we discussed about doing an insulin to carb validation for dinner. -At this  visit, we downloaded his Omni pod pump, but he does not introduce all his sugars into the pump.  He mostly introduces sugars around dinnertime.  These are normal alternating with high blood sugars in the 200s to 300s.  There is no pattern. -We discussed about the importance of getting a CGM and how to interpret the trend arrows.  He agrees to look into the Dexcom CGM.  He also wants to check with his insurance to see what other pumps are covered and hopefully start a closed-loop system.  I advised him to let me know about what they say -He feels that he is using more insulin since he started this and he actually finishes his supply of pots before the month is over.  He has to change the pod every 2.5 days, rather than every 3 days.  Because of this, he started to use insulin pens towards the end of the month, before he gets a new supply of his pods.  Indeed, reviewing his total insulin dose per day, this is almost 80 units, therefore, 200 units cannot last him 3 days.  I advised him to call [facility] and have them send me a refill request with 40 pods/month. -He also tells me that he may drop his sugars when he is active in the yard or playing softball.  He is not reducing the basal rates while active and I advised him to start doing so, by using a temporary basal rate of 80%, at most. -I recommended to: Patient Instructions  Please continue the following settings: - basal rates: 12 am: 1.6 3 am:  1.6 5 am: 1.7 9 am: 1.3 1 pm: 1.4 - ICR: 4 - target: 100-100 - ISF: 35 - Insulin on Board: 4 hours  Please try to use a temporary basal rate of 80% when exercising.  Please continue Levothyroxine 200 mcg daily.  Take the thyroid hormone every day, with water, at least 30 minutes before breakfast, separated by at least 4 hours from: - acid reflux medications - calcium - iron - multivitamins  - today, HbA1c is 7.6% (higher) - continue checking sugars at different times of the day - check 3-4x a  day, rotating checks - advised for yearly eye exams >> he is not UTD -again advised to schedule - Return to clinic in 3-4 mo with sugar log      2. Post-ablative hypothyroidism -Uncontrolled - latest thyroid labs reviewed with pt >> finally normal at last visit - he continues on LT4 200 mcg daily - pt feels good on this dose. - we discussed about taking the thyroid hormone every day, with water, >30 minutes before breakfast, separated by >4 hours from acid reflux medications, calcium, iron, multivitamins. Pt. is taking it correctly.  3. Hyperlipidemia  - Reviewed latest lipid panel from 07/2018: LDL improved, at goal, triglycerides slightly high Lab Results  Component Value Date   CHOL 135 08/19/2018   HDL 39.90 08/19/2018   LDLCALC 58 08/19/2018   LDLDIRECT 106.0 10/22/2017   TRIG 184.0 (H) 08/19/2018   CHOLHDL 3 08/19/2018  - Continues Lipitor 20 without side effects.  Willie Pavlovristina Stevens Magwood, MD PhD [facility]

## 2019-03-29 NOTE — Patient Instructions (Addendum)
Please continue the following settings: - basal rates: 12 am: 1.6 3 am:  1.6 5 am: 1.7 9 am: 1.3 1 pm: 1.4 - ICR: 4 - target: 100-100 - ISF: 35 - Insulin on Board: 4 hours  Please try to use a temporary basal rate of 80% when exercising.  Please continue Levothyroxine 200 mcg daily.  Take the thyroid hormone every day, with water, at least 30 minutes before breakfast, separated by at least 4 hours from: - acid reflux medications - calcium - iron - multivitamins

## 2019-04-11 DIAGNOSIS — E109 Type 1 diabetes mellitus without complications: Secondary | ICD-10-CM | POA: Diagnosis not present

## 2019-04-11 DIAGNOSIS — Z794 Long term (current) use of insulin: Secondary | ICD-10-CM | POA: Diagnosis not present

## 2019-04-11 DIAGNOSIS — E1065 Type 1 diabetes mellitus with hyperglycemia: Secondary | ICD-10-CM | POA: Diagnosis not present

## 2019-04-21 ENCOUNTER — Telehealth: Payer: Self-pay | Admitting: Internal Medicine

## 2019-04-21 NOTE — Telephone Encounter (Signed)
Willie Spencer states they are missing the amount of blood glucose test per day on paperwork.  FAX # [phone]

## 2019-04-21 NOTE — Telephone Encounter (Signed)
Glucose test info added to paperwork and faxed back.

## 2019-04-27 DIAGNOSIS — Z794 Long term (current) use of insulin: Secondary | ICD-10-CM | POA: Diagnosis not present

## 2019-04-27 DIAGNOSIS — E1065 Type 1 diabetes mellitus with hyperglycemia: Secondary | ICD-10-CM | POA: Diagnosis not present

## 2019-04-27 DIAGNOSIS — E109 Type 1 diabetes mellitus without complications: Secondary | ICD-10-CM | POA: Diagnosis not present

## 2019-05-09 DIAGNOSIS — E1065 Type 1 diabetes mellitus with hyperglycemia: Secondary | ICD-10-CM | POA: Diagnosis not present

## 2019-05-09 DIAGNOSIS — Z794 Long term (current) use of insulin: Secondary | ICD-10-CM | POA: Diagnosis not present

## 2019-05-09 DIAGNOSIS — E109 Type 1 diabetes mellitus without complications: Secondary | ICD-10-CM | POA: Diagnosis not present

## 2019-05-30 DIAGNOSIS — Z794 Long term (current) use of insulin: Secondary | ICD-10-CM | POA: Diagnosis not present

## 2019-05-30 DIAGNOSIS — E109 Type 1 diabetes mellitus without complications: Secondary | ICD-10-CM | POA: Diagnosis not present

## 2019-05-30 DIAGNOSIS — E1065 Type 1 diabetes mellitus with hyperglycemia: Secondary | ICD-10-CM | POA: Diagnosis not present

## 2019-07-13 DIAGNOSIS — Z794 Long term (current) use of insulin: Secondary | ICD-10-CM | POA: Diagnosis not present

## 2019-07-13 DIAGNOSIS — E109 Type 1 diabetes mellitus without complications: Secondary | ICD-10-CM | POA: Diagnosis not present

## 2019-07-13 DIAGNOSIS — E1065 Type 1 diabetes mellitus with hyperglycemia: Secondary | ICD-10-CM | POA: Diagnosis not present

## 2019-07-14 ENCOUNTER — Other Ambulatory Visit: Payer: Self-pay | Admitting: Internal Medicine

## 2019-07-28 ENCOUNTER — Other Ambulatory Visit: Payer: Self-pay

## 2019-07-28 ENCOUNTER — Telehealth: Payer: Self-pay

## 2019-07-28 MED ORDER — NOVOLOG FLEXPEN 100 UNIT/ML ~~LOC~~ SOPN: PEN_INJECTOR | SUBCUTANEOUS | 0 refills | Status: DC

## 2019-07-28 NOTE — Telephone Encounter (Signed)
MEDICATION: insulin   PHARMACY:  [facility] A 90 DAY SUPPLY :   IS PATIENT OUT OF MEDICATION:   IF NOT; HOW MUCH IS LEFT:   LAST APPOINTMENT DATE: @10 /15/2020  NEXT APPOINTMENT DATE:@11 /10/2018  DO WE HAVE YOUR PERMISSION TO LEAVE A DETAILED MESSAGE:  OTHER COMMENTS:    **Let patient know to contact pharmacy at the end of the day to make sure medication is ready. **  ** Please notify patient to allow 48-72 hours to process**  **Encourage patient to contact the pharmacy for refills or they can request refills through [facility]**

## 2019-07-28 NOTE — Telephone Encounter (Signed)
Rx sent 

## 2019-08-01 ENCOUNTER — Other Ambulatory Visit: Payer: Self-pay

## 2019-08-01 ENCOUNTER — Ambulatory Visit: Payer: BC Managed Care – PPO | Admitting: Internal Medicine

## 2019-08-01 ENCOUNTER — Telehealth: Payer: Self-pay | Admitting: Nutrition

## 2019-08-01 ENCOUNTER — Other Ambulatory Visit: Payer: Self-pay | Admitting: Endocrinology

## 2019-08-01 ENCOUNTER — Encounter: Payer: Self-pay | Admitting: Internal Medicine

## 2019-08-01 VITALS — BP 140/90 | HR 68 | Ht 75.0 in | Wt 239.0 lb

## 2019-08-01 DIAGNOSIS — E785 Hyperlipidemia, unspecified: Secondary | ICD-10-CM

## 2019-08-01 DIAGNOSIS — Z23 Encounter for immunization: Secondary | ICD-10-CM | POA: Diagnosis not present

## 2019-08-01 DIAGNOSIS — E890 Postprocedural hypothyroidism: Secondary | ICD-10-CM

## 2019-08-01 DIAGNOSIS — E1065 Type 1 diabetes mellitus with hyperglycemia: Secondary | ICD-10-CM | POA: Diagnosis not present

## 2019-08-01 LAB — MICROALBUMIN / CREATININE URINE RATIO
Creatinine,U: 68.2 mg/dL
Microalb Creat Ratio: 1 mg/g (ref 0.0–30.0)
Microalb, Ur: <0.7 mg/dL (ref 0.0–1.9)

## 2019-08-01 LAB — LIPID PANEL
Cholesterol: 155 mg/dL (ref 0–200)
HDL: 43.2 mg/dL (ref >39.00)
LDL Cholesterol: 94 mg/dL (ref 0–99)
NonHDL: 111.92
Total CHOL/HDL Ratio: 4
Triglycerides: 88 mg/dL (ref 0.0–149.0)
VLDL: 17.6 mg/dL (ref 0.0–40.0)

## 2019-08-01 LAB — POCT GLYCOSYLATED HEMOGLOBIN (HGB A1C): Hemoglobin A1C: 7 % — AB (ref 4.0–5.6)

## 2019-08-01 LAB — TSH: TSH: 6.27 u[IU]/mL — ABNORMAL HIGH (ref 0.35–4.50)

## 2019-08-01 LAB — T4, FREE: Free T4: 1.14 ng/dL (ref 0.60–1.60)

## 2019-08-01 MED ORDER — FIASP 100 UNIT/ML ~~LOC~~ SOLN: 80.0000 [IU] | Freq: Every day | SUBCUTANEOUS | 3 refills | Status: DC

## 2019-08-01 NOTE — Telephone Encounter (Signed)
Willie Spencer was trained on the [place].  We discussed the difference between sensor readings and blood sugar readings and he reported good understanding of this.  He put on a Dexcom sensor and linked it to his reader.  He did not have his phone with him, but was given directions on how to download the G6 mobil app and link it with our system.  He says his wife does all of this, and was told to have her call me tomorrow with directions on how to do this.  He agreed to have her do this.

## 2019-08-01 NOTE — Telephone Encounter (Signed)
She is on Fiasp.  I sent this to his pharmacy for 3 months.  Took off NovoLog from his list.

## 2019-08-01 NOTE — Telephone Encounter (Signed)
Patient says wrong Rx was sent in and states, patient called in about insulin refill said it started with an F i sxeen nothing in his chart with meds that start with letter F    Please call and advise

## 2019-08-01 NOTE — Telephone Encounter (Signed)
Please advise 

## 2019-08-01 NOTE — Telephone Encounter (Signed)
MEDICATION:   PHARMACY:    IS THIS A 90 DAY SUPPLY :   IS PATIENT OUT OF MEDICATION:   IF NOT; HOW MUCH IS LEFT:   LAST APPOINTMENT DATE: @10 /15/2020  NEXT APPOINTMENT DATE:@10 /29/2020  DO WE HAVE YOUR PERMISSION TO LEAVE A DETAILED MESSAGE:  OTHER COMMENTS:    **Let patient know to contact pharmacy at the end of the day to make sure medication is ready. **  ** Please notify patient to allow 48-72 hours to process**  **Encourage patient to contact the pharmacy for refills or they can request refills through [facility]**

## 2019-08-01 NOTE — Progress Notes (Signed)
Patient ID: Willie Spencer, male   DOB: [birth year], 53 y.o.   MRN: [id number]   HPI: Willie Spencer is a 53 y.o.-year-old male, returning for f/u for DM1, dx 1987, uncontrolled, without complications and post ablative hypothyroidism. He saw Dr. Tedd SiasSolum in the past, last visit 2015.  Last visit with me 4 months ago.  He is on insulin pump, OmniPod, started 09/16/2016.  Uses FiASP in the pump -changed from Humalog.  He initially started on Omni pod Dash but this became not covered by his insurance so he had to go back to his regular Omni pod pump.  Supplies through [facility].  He would like to get on the Medtronic 670 G.  At last visit I recommended a Dexcom G6 and send a prescription for this to his pharmacy.  He just got this - not attached but will have this attached today as he has a diabetes educator appointment after our visit.   DM1: Reviewed HbA1c levels: Lab Results  Component Value Date   HGBA1C 7.6 (A) 03/29/2019   HGBA1C 7.4 (A) 12/02/2018   HGBA1C 7.3 (A) 08/19/2018   He was on a regimen of: - Lantus 40 units at dinnertime - Humalog 10-15 units 3x a day   Pump settings: - basal rates: 12 am: 1.6 3 am:  1.6 5 am: 1.7 9 am: 1.3 1 pm: 1.4 - ICR: 4 - target: 100-100 - ISF: 35 - Insulin on Board: 4 hours Try to use a temporary basal rate of 80% when exercising >> not using. TDD from basal insulin: 50% >> 46% (32 units) >> 47% >> 44%  TDD from bolus insulin: 50% >> 54% (37 units) >> 53% >> 56% Total daily dose 60.7 units >> up to 80 units - extended bolusing: Not using - changes infusion site: Every 3 days  Pt checks his sugars twice a day:   Lowest sugar was 30s (wife called EMS - ~2013) >> 61 >> 37 (after a manual bolus) >> ...60 >> 45 after softball; he has hypoglycemia awareness in the 70s.  No previous hypoglycemia or DKA admissions.  He has a glucagon pen at home. Highest sugar was 310 >> 400 (ran out of insulin in the pod) >> 426  Pt's meals are: - Breakfast:  sausage biscuits, orange crackers - Lunch: sandwich; fast food - Dinner: meat + veggies + starch - Snacks: at bedtime; 3: fruit, PB crackers, nuts  He plays softball 2x a week.  -+ Mild CKD, last BUN/creatinine:  Lab Results  Component Value Date   BUN 12 08/19/2018   BUN 14 10/22/2017   CREATININE 1.17 08/19/2018   CREATININE 1.09 10/22/2017  On losartan.  -+ HL; latest set of lipids: Lab Results  Component Value Date   CHOL 135 08/19/2018   HDL 39.90 08/19/2018   LDLCALC 58 08/19/2018   LDLDIRECT 106.0 10/22/2017   TRIG 184.0 (H) 08/19/2018   CHOLHDL 3 08/19/2018  On pravastatin 20.  - last eye exam was 2018: No DR  -No numbness and tingling in his feet.  Post ablative hypothyroidism: -History of Graves' disease, status post RAI treatment 07/2016  His TFTs have been fluctuating: Lab Results  Component Value Date   TSH 2.14 12/02/2018   TSH 5.26 (H) 08/19/2018   TSH 11.52 (H) 01/20/2018   TSH 18.25 (H) 10/22/2017   TSH 7.52 (H) 03/10/2017   TSH 25.34 (H) 10/27/2016   TSH 8.46 (H) 07/30/2016   TSH 61.14 (H) 01/29/2016   TSH 1.697  07/30/2015   FREET4 1.25 12/02/2018   FREET4 1.11 08/19/2018   FREET4 0.97 01/20/2018   FREET4 0.90 10/22/2017   FREET4 0.97 03/10/2017   FREET4 0.42 (L) 10/27/2016   FREET4 1.2 07/30/2016   Pt is on levothyroxine 200 mcg daily, taken: - in am - fasting - at least 30 min from b'fast - no Ca, Fe, PPIs - + MVIs at night - not on Biotin  Pt denies: - feeling nodules in neck - hoarseness - dysphagia - choking - SOB with lying down  ROS: Constitutional: no weight gain/no weight loss, no fatigue, no subjective hyperthermia, no subjective hypothermia Eyes: no blurry vision, no xerophthalmia ENT: no sore throat, + see HPI Cardiovascular: no CP/no SOB/no palpitations/no leg swelling Respiratory: no cough/no SOB/no wheezing Gastrointestinal: no N/no V/no D/no C/no acid reflux Musculoskeletal: no muscle aches/no joint aches  Skin: no rashes, no hair loss Neurological: no tremors/no numbness/no tingling/no dizziness  I reviewed pt's medications, allergies, PMH, social hx, family hx, and changes were documented in the history of present illness. Otherwise, unchanged from my initial visit note.  Past Medical History:  Diagnosis Date  . Diabetes mellitus without complication (HCC)   . Hypertension   . Thyroid disease    No past surgical history on file. Social History   Social History  . Marital status: Married    Spouse name: N/A  . Number of children: 2   Occupational History  .  Maintenance supervisor   Social History Main Topics  . Smoking status: Current Every Day Smoker    Packs/day: 1.00    Types: Cigarettes    Last attempt to quit: 08/30/2015  . Smokeless tobacco: Never Used     Comment: uses ecigs  . Alcohol use      Comment: 6 beers per weekend  . Drug use: No   Current Outpatient Medications on File Prior to Visit  Medication Sig Dispense Refill  . atorvastatin (LIPITOR) 20 MG tablet TAKE 1 TABLET BY MOUTH EVERY DAY 90 tablet 3  . Continuous Blood Gluc Transmit (DEXCOM G6 TRANSMITTER) MISC 1 Device by Does not apply route every 3 (three) months. 1 each 3  . CONTOUR NEXT TEST test strip USE AS INSTRUCTED TO TEST SUGARS 4 TIMES DAILY 350 strip 4  . glucagon (GLUCAGEN) 1 MG SOLR injection Inject 1 mg into the muscle once as needed for up to 1 dose for low blood sugar. 1 each 11  . insulin aspart (NOVOLOG FLEXPEN) 100 UNIT/ML FlexPen Use to inject 10-15 units 3 times a day. 15 mL 0  . Insulin Pen Needle 32G X 6 MM MISC To use with insulin pen. 100 each 4  . levothyroxine (SYNTHROID) 200 MCG tablet TAKE 1 TABLET (200 MCG TOTAL) BY MOUTH DAILY BEFORE BREAKFAST. 90 tablet 3  . losartan (COZAAR) 25 MG tablet TAKE 1 TABLET BY MOUTH DAILY (Patient not taking: Reported on 12/02/2018) 30 tablet 1  . ranitidine (ZANTAC) 150 MG tablet Take 1 tablet (150 mg total) by mouth 2 (two) times daily. 20 tablet  0   No current facility-administered medications on file prior to visit.    Allergies  Allergen Reactions  . Wellbutrin [Bupropion]     Gum swelling  . Levemir [Insulin Detemir] Rash   Family History  Problem Relation Age of Onset  . Diabetes Paternal Grandfather   . Stroke Paternal Grandfather   . Heart disease Paternal Grandfather    PE: BP 140/90   Pulse 68   Ht   (1.905 m)   Wt 239 lb (108.4 kg)   SpO2 97%   BMI 29.87 kg/m  Wt Readings from Last 3 Encounters:  08/01/19 239 lb (108.4 kg)  03/29/19 237 lb (107.5 kg)  12/02/18 241 lb (109.3 kg)   Constitutional: overweight, in NAD Eyes: PERRLA, EOMI, no exophthalmos ENT: moist mucous membranes, no thyromegaly, no cervical lymphadenopathy Cardiovascular: RRR, No MRG Respiratory: CTA B Gastrointestinal: abdomen soft, NT, ND, BS+ Musculoskeletal: no deformities, strength intact in all 4 Skin: moist, warm, no rashes Neurological: no tremor with outstretched hands, DTR normal in all 4  ASSESSMENT: 1. DM1, uncontrolled, without Long term complications, but with hyperglycemia  2. Post-ablative hypothyroidism  3.  Hyperlipidemia  PLAN:  1. Patient with longstanding, uncontrolled, type 1 diabetes, on insulin pump.  He is on an Omni pod insulin pump and will get a Dexcom G6 CGM attached today.  This was communicated with the Omni pod Dash, however, unfortunately, he had to go back to the regular Omni pod since the Omni pod Dash was not covered by his insurance.  He would have preferred to switch to Medtronic 670 G insulin pump but this was not covered by his insurance. -At last visit, we reviewed his downloads and he did not introduce all of his sugars into the pump.  He mostly introduced sugars in the second half of the day.  We discussed about trying to introduce all of them.  He is still not doing so so today we reviewed both his pump and his glucometer downloads.  He has more low blood sugars in the glucometer which he  does not introduce into the pump.  Attaching a Dexcom CGM would be extremely beneficial for him, not only giving [place] more information about his sugars throughout the day but also giving him alarms whenever he is in danger of dropping his sugars. -At last visit I advised him to decrease his basal rates during exercise to 80%.  He did not do so but reviewing his meter downloads, it appears that he is still dropping his sugars after playing softball on Monday, Wednesday, and during the weekend.  I again advised him to make this change in his basal rate. -Also, his sugars after he drinks his coffee in the morning are higher than goal.  He introduces 15 g of carbs into the pump for the coffee and we discussed that this may not be enough.  He is adding coffee mate creamer for which I advised him to enter at least 20 g of carbs. -Otherwise, sugars later in the day can be very well controlled but occasionally he is in the 400s.  Yesterday he ate mozzarella sticks and his sugars increase significantly and stayed elevated.  We discussed this the combination of starch with fat (just like pizza) is very difficult to cover with insulin. -For now, we will continue the current basal rates and insulin to carb ratios.  He does appear to get appropriate insulin after meals, but I think we will get a better idea after he attaches his Dexcom. -I recommended to: Patient Instructions  Please continue: - basal rates: 12 am: 1.6 3 am:  1.6 5 am: 1.7 9 am: 1.3 1 pm: 1.4 - ICR: 4 - target: 100-100 - ISF: 35 - Insulin on Board: 4 hours  Try to enter at least 20g carbs for coffee if you add the creamer.  Try to use a temporary basal rate of 80% when exercising.  Please continue levothyroxine  200 mcg daily.  Take the thyroid hormone every day, with water, at least 30 minutes before breakfast, separated by at least 4 hours from: - acid reflux medications - calcium - iron - multivitamins   Please stop at the lab.   - we checked his HbA1c: 7.0% (better) - advised to check sugars at different times of the day - 4x a day, rotating check times - advised for yearly eye exams >> he is not UTD - + Flu shot today - + Annual labs today - return to clinic in 3-4 months     2. Post-ablative hypothyroidism -Uncontrolled - latest thyroid labs reviewed with pt >> finally normal at last check in 11/2018 - he continues on LT4 200 mcg daily - pt feels good on this dose. - we discussed about taking the thyroid hormone every day, with water, >30 minutes before breakfast, separated by >4 hours from acid reflux medications, calcium, iron, multivitamins. Pt. is taking it correctly. - will check thyroid tests today: TSH and fT4 - If labs are abnormal, he will need to return for repeat TFTs in 1.5 months  3. Hyperlipidemia  -Reviewed latest lipid panel from 07/2018: Triglycerides high, the rest of the fractions at goal: Lab Results  Component Value Date   CHOL 135 08/19/2018   HDL 39.90 08/19/2018   LDLCALC 58 08/19/2018   LDLDIRECT 106.0 10/22/2017   TRIG 184.0 (H) 08/19/2018   CHOLHDL 3 08/19/2018  -Continues Lipitor 20 without side effects. -Check lipids today  Orders Placed This Encounter  Procedures  . Flu Vaccine QUAD 36+ mos IM  . Microalbumin / creatinine urine ratio  . COMPLETE METABOLIC PANEL WITH GFR  . Lipid panel  . TSH  . T4, free  . Ambulatory referral to diabetic education  . POCT glycosylated hemoglobin (Hb A1C)   - time spent with the patient: 40 min, of which >50% was spent in reviewing his pump and glucometer downloads, discussing his hypo- and hyper-glycemic episodes, reviewing previous labs and pump settings and developing a plan to avoid hypo- and hyper-glycemia.   Component     Latest Ref Rng & Units 08/01/2019  Glucose     65 - 99 mg/dL 562 (H)  BUN     7 - 25 mg/dL 13  Creatinine     1.30 - 1.33 mg/dL 8.65  GFR, Est Non African American     > OR = 60 mL/min/1.25m2 85  GFR,  Est African American     > OR = 60 mL/min/1.35m2 98  BUN/Creatinine Ratio     6 - 22 (calc) NOT APPLICABLE  Sodium     135 - 146 mmol/L 140  Potassium     3.5 - 5.3 mmol/L 4.8  Chloride     98 - 110 mmol/L 103  CO2     20 - 32 mmol/L 27  Calcium     8.6 - 10.3 mg/dL 9.3  Total Protein     6.1 - 8.1 g/dL 6.9  Albumin MSPROF     3.6 - 5.1 g/dL 4.5  Globulin     1.9 - 3.7 g/dL (calc) 2.4  AG Ratio     1.0 - 2.5 (calc) 1.9  Total Bilirubin     0.2 - 1.2 mg/dL 0.5  Alkaline phosphatase (APISO)     35 - 144 U/L 88  AST     10 - 35 U/L 27  ALT     9 - 46 U/L 40  Cholesterol  0 - 200 mg/dL 155  Triglycerides     0.0 - 149.0 mg/dL 88.0  HDL Cholesterol     >39.00 mg/dL 43.20  VLDL     0.0 - 40.0 mg/dL 17.6  LDL (calc)     0 - 99 mg/dL 94  Total CHOL/HDL Ratio      4  NonHDL      111.92  Microalb, Ur     0.0 - 1.9 mg/dL <0.7  Creatinine,U     mg/dL 68.2  MICROALB/CREAT RATIO     0.0 - 30.0 mg/g 1.0  TSH     0.35 - 4.50 uIU/mL 6.27 (H)  T4,Free(Direct)     0.60 - 1.60 ng/dL 1.14   Labs are at goal with the exception of a slightly elevated TSH.  She is on a high dose of levothyroxine.  Due to previous variability in his TFTs, I will encourage compliance but will not change the dose now.  We will repeat that test at next visit.  Philemon Kingdom, MD PhD [facility]

## 2019-08-01 NOTE — Patient Instructions (Addendum)
Please continue: - basal rates: 12 am: 1.6 3 am:  1.6 5 am: 1.7 9 am: 1.3 1 pm: 1.4 - ICR: 4 - target: 100-100 - ISF: 35 - Insulin on Board: 4 hours  Try to enter at least 20g carbs for coffee if you add the creamer.  Try to use a temporary basal rate of 80% when exercising.  Please continue levothyroxine 200 mcg daily.  Take the thyroid hormone every day, with water, at least 30 minutes before breakfast, separated by at least 4 hours from: - acid reflux medications - calcium - iron - multivitamins   Please stop at the lab.

## 2019-08-02 LAB — COMPLETE METABOLIC PANEL WITH GFR
AG Ratio: 1.9 (calc) (ref 1.0–2.5)
ALT: 40 U/L (ref 9–46)
AST: 27 U/L (ref 10–35)
Albumin: 4.5 g/dL (ref 3.6–5.1)
Alkaline phosphatase (APISO): 88 U/L (ref 35–144)
BUN: 13 mg/dL (ref 7–25)
CO2: 27 mmol/L (ref 20–32)
Calcium: 9.3 mg/dL (ref 8.6–10.3)
Chloride: 103 mmol/L (ref 98–110)
Creat: 1.02 mg/dL (ref 0.70–1.33)
GFR, Est African American: 98 mL/min/{1.73_m2} (ref >=60)
GFR, Est Non African American: 85 mL/min/{1.73_m2} (ref >=60)
Globulin: 2.4 g/dL (calc) (ref 1.9–3.7)
Glucose, Bld: 131 mg/dL — ABNORMAL HIGH (ref 65–99)
Potassium: 4.8 mmol/L (ref 3.5–5.3)
Sodium: 140 mmol/L (ref 135–146)
Total Bilirubin: 0.5 mg/dL (ref 0.2–1.2)
Total Protein: 6.9 g/dL (ref 6.1–8.1)

## 2019-08-02 NOTE — Progress Notes (Signed)
Not seen here in more than 3 years.

## 2019-08-10 DIAGNOSIS — Z794 Long term (current) use of insulin: Secondary | ICD-10-CM | POA: Diagnosis not present

## 2019-08-10 DIAGNOSIS — E109 Type 1 diabetes mellitus without complications: Secondary | ICD-10-CM | POA: Diagnosis not present

## 2019-08-10 DIAGNOSIS — E1065 Type 1 diabetes mellitus with hyperglycemia: Secondary | ICD-10-CM | POA: Diagnosis not present

## 2019-09-10 DIAGNOSIS — E109 Type 1 diabetes mellitus without complications: Secondary | ICD-10-CM | POA: Diagnosis not present

## 2019-09-10 DIAGNOSIS — Z794 Long term (current) use of insulin: Secondary | ICD-10-CM | POA: Diagnosis not present

## 2019-09-10 DIAGNOSIS — E1065 Type 1 diabetes mellitus with hyperglycemia: Secondary | ICD-10-CM | POA: Diagnosis not present

## 2019-10-12 DIAGNOSIS — Z794 Long term (current) use of insulin: Secondary | ICD-10-CM | POA: Diagnosis not present

## 2019-10-12 DIAGNOSIS — E1065 Type 1 diabetes mellitus with hyperglycemia: Secondary | ICD-10-CM | POA: Diagnosis not present

## 2019-10-12 DIAGNOSIS — E109 Type 1 diabetes mellitus without complications: Secondary | ICD-10-CM | POA: Diagnosis not present

## 2019-11-11 DIAGNOSIS — E1065 Type 1 diabetes mellitus with hyperglycemia: Secondary | ICD-10-CM | POA: Diagnosis not present

## 2019-11-11 DIAGNOSIS — Z794 Long term (current) use of insulin: Secondary | ICD-10-CM | POA: Diagnosis not present

## 2019-11-11 DIAGNOSIS — E109 Type 1 diabetes mellitus without complications: Secondary | ICD-10-CM | POA: Diagnosis not present

## 2019-12-06 ENCOUNTER — Encounter: Payer: Self-pay | Admitting: Internal Medicine

## 2019-12-06 ENCOUNTER — Other Ambulatory Visit: Payer: Self-pay

## 2019-12-06 ENCOUNTER — Ambulatory Visit: Payer: BC Managed Care – PPO | Admitting: Internal Medicine

## 2019-12-06 VITALS — BP 138/88 | HR 68 | Ht 75.0 in | Wt 239.0 lb

## 2019-12-06 DIAGNOSIS — E785 Hyperlipidemia, unspecified: Secondary | ICD-10-CM

## 2019-12-06 DIAGNOSIS — E890 Postprocedural hypothyroidism: Secondary | ICD-10-CM

## 2019-12-06 DIAGNOSIS — E1065 Type 1 diabetes mellitus with hyperglycemia: Secondary | ICD-10-CM | POA: Diagnosis not present

## 2019-12-06 LAB — POCT GLYCOSYLATED HEMOGLOBIN (HGB A1C): Hemoglobin A1C: 7.1 % — AB (ref 4.0–5.6)

## 2019-12-06 NOTE — Patient Instructions (Addendum)
Please continue: - basal rates: 12 am: 1.6 3 am:  1.6 5 am: 1.7 9 am: 1.3 1 pm: 1.4 - ICR: 4 - target: 100-100 - ISF: 35 - Insulin on Board: 4 hours  Try to enter at least 20g carbs for coffee if you add the creamer. Try to use a temporary basal rate of 80% when exercising.  Try to eliminate the bedtime snack or have peanuts if hungry.  Please continue levothyroxine 200 mcg daily.  Take the thyroid hormone every day, with water, at least 30 minutes before breakfast, separated by at least 4 hours from: - acid reflux medications - calcium - iron - multivitamins   Please stop at the lab.

## 2019-12-06 NOTE — Progress Notes (Signed)
Patient ID: Willie Spencer, male   DOB: Dec 14, 1965, 54 y.o.   MRN: [id number]   This visit occurred during the SARS-CoV-2 public health emergency.  Safety protocols were in place, including screening questions prior to the visit, additional usage of staff PPE, and extensive cleaning of exam room while observing appropriate contact time as indicated for disinfecting solutions.   HPI: Willie Spencer is a 54 y.o.-year-old male, returning for f/u for DM1, dx 1987, uncontrolled, without complications and post ablative hypothyroidism. He saw Dr. Tedd Sias in the past, last visit 2015.  Last visit with me 4 months ago.  He is on an Omni pod insulin pump starting 09/16/2016.  He uses Fiasp in the pump-changed from Humalog. Since last visit, he also started the Dexcom G6 CGM.  He initially started on Omni pod Dash but this became not covered by his insurance so he had to go back to his regular Omni pod pump.  Supplies through [facility].  He would like to get on the Medtronic 670 G.  In the last 2 weeks, he restarted softball >> sugars are better.  DM1: Reviewed HbA1c levels: Lab Results  Component Value Date   HGBA1C 7.0 (A) 08/01/2019   HGBA1C 7.6 (A) 03/29/2019   HGBA1C 7.4 (A) 12/02/2018   He was on a regimen of: - Lantus 40 units at dinnertime - Humalog 10-15 units 3x a day   Pump settings: - basal rates: 12 am: 1.6 3 am:  1.6 5 am: 1.7 9 am: 1.3 1 pm: 1.4 - ICR: 4 - target: 100-100 - ISF: 35 - Insulin on Board: 4 hours Try to enter at least 20g carbs for coffee if you add the creamer. Try to use a temporary basal rate of 80% when exercising. TDD from basal insulin: 50% >> 46% (32 units) >> 47% >> 44% >> 35.4 units (44-62%) TDD from bolus insulin: 50% >> 54% (37 units) >> 53% >> 56% >> 17-43 units Total daily dose 60.7 units >> up to 80 units - extended bolusing: Not using - changes infusion site: Every 2.5-3 days  He checks his sugars more than 4 times a day with his Dexcom CGM:  CGM  parameters: - Average from CGM: 140 +/- 51 - GMI: 6.7%  Time in range:  - very low (40-50): 0% - low (50-70): 1.8% - normal range (70-180): 76.2% - high sugars (180-250): 22% - very high sugars (250-400): 3.5%    Lowest sugar was 30s (wife called EMS - ~2013) >> 61 >> 37 (after a manual bolus) >> ...60 >> 45 after softball >> 63; he has hypoglycemia awareness in the 70s.  No previous hypoglycemia or DKA admissions.  He has a glucagon pen at home Highest sugar was 310 >> 400 (ran out of insulin in the pod) >> 426 >> <300  Pt's meals are: - Breakfast: sausage biscuits, orange crackers - Lunch: sandwich; fast food - Dinner: meat + veggies + starch - Snacks: at bedtime; 3: fruit, PB crackers, nuts  -+ Mild CKD, last BUN/creatinine:  Lab Results  Component Value Date   BUN 13 08/01/2019   BUN 12 08/19/2018   CREATININE 1.02 08/01/2019   CREATININE 1.17 08/19/2018  On losartan.  -+ HL; latest set of lipids: Lab Results  Component Value Date   CHOL 155 08/01/2019   HDL 43.20 08/01/2019   LDLCALC 94 08/01/2019   LDLDIRECT 106.0 10/22/2017   TRIG 88.0 08/01/2019   CHOLHDL 4 08/01/2019  On Lipitor 20.  -  last eye exam was 2018: No DR  - no numbness and tingling in his feet.  Post ablative hypothyroidism: -History of Graves' disease, status post RAI treatment 07/2016  Case TFTs have been fluctuating in the past: Lab Results  Component Value Date   TSH 6.27 (H) 08/01/2019   TSH 2.14 12/02/2018   TSH 5.26 (H) 08/19/2018   TSH 11.52 (H) 01/20/2018   TSH 18.25 (H) 10/22/2017   TSH 7.52 (H) 03/10/2017   TSH 25.34 (H) 10/27/2016   TSH 8.46 (H) 07/30/2016   TSH 61.14 (H) 01/29/2016   TSH 1.697 07/30/2015   FREET4 1.14 08/01/2019   FREET4 1.25 12/02/2018   FREET4 1.11 08/19/2018   FREET4 0.97 01/20/2018   FREET4 0.90 10/22/2017   FREET4 0.97 03/10/2017   FREET4 0.42 (L) 10/27/2016   FREET4 1.2 07/30/2016   He is on levothyroxine 200 mcg daily, taken: - in am -  fasting - at least 30 min from b'fast - no Ca, Fe, PPIs - + MVIs pm - not on Biotin  Pt denies: - feeling nodules in neck - hoarseness - dysphagia - choking - SOB with lying down  ROS: Constitutional: no weight gain/no weight loss, no fatigue, no subjective hyperthermia, no subjective hypothermia Eyes: no blurry vision, no xerophthalmia ENT: no sore throat, + see HPI Cardiovascular: no CP/no SOB/no palpitations/no leg swelling Respiratory: no cough/no SOB/no wheezing Gastrointestinal: no N/no V/no D/no C/no acid reflux Musculoskeletal: no muscle aches/no joint aches Skin: no rashes, no hair loss Neurological: no tremors/no numbness/no tingling/no dizziness  I reviewed pt's medications, allergies, PMH, social hx, family hx, and changes were documented in the history of present illness. Otherwise, unchanged from my initial visit note.  Past Medical History:  Diagnosis Date  . Diabetes mellitus without complication (HCC)   . Hypertension   . Thyroid disease    No past surgical history on file. Social History   Social History  . Marital status: Married    Spouse name: N/A  . Number of children: 2   Occupational History  .  Maintenance supervisor   Social History Main Topics  . Smoking status: Current Every Day Smoker    Packs/day: 1.00    Types: Cigarettes    Last attempt to quit: 08/30/2015  . Smokeless tobacco: Never Used     Comment: uses ecigs  . Alcohol use      Comment: 6 beers per weekend  . Drug use: No   Current Outpatient Medications on File Prior to Visit  Medication Sig Dispense Refill  . atorvastatin (LIPITOR) 20 MG tablet TAKE 1 TABLET BY MOUTH EVERY DAY 90 tablet 3  . Continuous Blood Gluc Transmit (DEXCOM G6 TRANSMITTER) MISC 1 Device by Does not apply route every 3 (three) months. 1 each 3  . CONTOUR NEXT TEST test strip USE AS INSTRUCTED TO TEST SUGARS 4 TIMES DAILY 350 strip 4  . glucagon (GLUCAGEN) 1 MG SOLR injection Inject 1 mg into the  muscle once as needed for up to 1 dose for low blood sugar. 1 each 11  . Insulin Aspart, w/Niacinamide, (FIASP) 100 UNIT/ML SOLN Inject 80 Units into the skin daily. In insulin pump. 90 mL 3  . Insulin Pen Needle 32G X 6 MM MISC To use with insulin pen. 100 each 4  . levothyroxine (SYNTHROID) 200 MCG tablet TAKE 1 TABLET (200 MCG TOTAL) BY MOUTH DAILY BEFORE BREAKFAST. 90 tablet 3  . losartan (COZAAR) 25 MG tablet TAKE 1 TABLET BY MOUTH DAILY  30 tablet 1  . NOVOLOG FLEXPEN 100 UNIT/ML FlexPen USE TO INJECT 10-15 UNITS 3 TIMES A DAY. 15 pen 1  . ranitidine (ZANTAC) 150 MG tablet Take 1 tablet (150 mg total) by mouth 2 (two) times daily. 20 tablet 0   No current facility-administered medications on file prior to visit.   Allergies  Allergen Reactions  . Wellbutrin [Bupropion]     Gum swelling  . Levemir [Insulin Detemir] Rash   Family History  Problem Relation Age of Onset  . Diabetes Paternal Grandfather   . Stroke Paternal Grandfather   . Heart disease Paternal Grandfather    PE: There were no vitals taken for this visit. Wt Readings from Last 3 Encounters:  08/01/19 239 lb (108.4 kg)  03/29/19 237 lb (107.5 kg)  12/02/18 241 lb (109.3 kg)   Constitutional: overweight, in NAD Eyes: PERRLA, EOMI, no exophthalmos ENT: moist mucous membranes, no thyromegaly, no cervical lymphadenopathy Cardiovascular: RRR, No MRG Respiratory: CTA B Gastrointestinal: abdomen soft, NT, ND, BS+ Musculoskeletal: no deformities, strength intact in all 4 Skin: moist, warm, no rashes Neurological: no tremor with outstretched hands, DTR normal in all 4  ASSESSMENT: 1. DM1, uncontrolled, without Long term complications, but with hyperglycemia  2. Post-ablative hypothyroidism  3.  Hyperlipidemia  PLAN:  1. Patient with longstanding, uncontrolled, type 1 diabetes, on insulin pump.  He is on an Omni pod insulin pump and is now also on the Dexcom G6 CGM, attached right after our last visit.   Unfortunately, the OmniPod  DASH was not covered by his insurance.  This would have communicated with a Dexcom CGM.  As of now, he is on the regular OmniPod and uses his Dexcom separately.  He would have preferred to switch to Medtronic 670 G insulin pump but this was not covered by his insurance. -In the past, he was not introducing all of his sugars into the pump so he had more low blood sugar that he was not entering in the pump, therefore, we could not make significant changes in his regimen at last visit.  At last visit, I advised him to decrease his basal rates during exercise to 80% as he was dropping his sugars after playing softball on Mondays, Wednesdays, and during the weekends.  Also, his sugars after drinking his coffee in the morning were higher than goal even after introducing 15 g of carbs into the pump for the coffee and we discussed that this may not have been enough.  I advised him to enter at least 20 g of carbs.  Otherwise, sugars were controlled later in the day, but occasionally with spikes in the 400s.  Since he was preparing to start the Dexcom, we did not make further changes in his regimen at that time, pending more data acquisition. -At this visit, sugars are much better and in the last 2 weeks, his average glucose was 140, corresponding to an HbA1c lower than 7.  In fact, his glucose management indicator is 6.7%.  He is in the target range 76.2% of the time.  The only times when he is out of the normal range is after his snack at night, after which she develops hyperglycemia to 230 and remains high until approximately 5 AM.  We discussed about the need to eliminate the snack, but he is afraid that he may drop his sugars overnight.  She is now very happy with his Dexcom CGM and mentions that he would likely be able to eliminate the snack and  rely on the CGM to alert him if dropping his blood sugars.  I advised him to let me know if he is dropping too low, in which case, will need to  reduce his basal rates overnight.  For now, I advised him that if he is hungry to use snacks with low or no carbohydrates, like nuts.  If he does eat a snack with carbs, he will need to introduce the right amount of carbs.  As of now, he is entering a lower amount of carbs, again for fear of hypoglycemia.  I do not feel that he needs to make any other changes in his pump settings for now. -His sugars have improved further in the last 2 weeks after that he started to play softball.  We again discussed about the need to reduce the basal rate temporarily to 80% when exercising.  He will try to do so.  However, he did not have significant low blood sugars in the last 2 weeks. -I recommended to: Patient Instructions  Please continue: - basal rates: 12 am: 1.6 3 am:  1.6 5 am: 1.7 9 am: 1.3 1 pm: 1.4 - ICR: 4 - target: 100-100 - ISF: 35 - Insulin on Board: 4 hours  Try to enter at least 20g carbs for coffee if you add the creamer. Try to use a temporary basal rate of 80% when exercising.  Try to eliminate the bedtime snack or have peanuts if hungry.  Please continue levothyroxine 200 mcg daily.  Take the thyroid hormone every day, with water, at least 30 minutes before breakfast, separated by at least 4 hours from: - acid reflux medications - calcium - iron - multivitamins   Please stop at the lab.  - we checked his HbA1c: 7.1% (slightly higher) - advised to check sugars at different times of the day - 4x a day, rotating check times - advised for yearly eye exams >> he is not UTD, but he is in the process of scheduling this - return to clinic in 3-4 months     2. Post-ablative hypothyroidism -Uncontrolled - latest thyroid labs reviewed with pt >> slightly high, but we did not change the dose of levothyroxine due to previous variability Lab Results  Component Value Date   TSH 6.27 (H) 08/01/2019   - he continues on LT4 200 mcg daily - pt feels good on this dose. - we discussed  about taking the thyroid hormone every day, with water, >30 minutes before breakfast, separated by >4 hours from acid reflux medications, calcium, iron, multivitamins. Pt. is taking it correctly. - will check thyroid tests today: TSH and fT4 - If labs are abnormal, he will need to return for repeat TFTs in 1.5 months  3. Hyperlipidemia  -Reviewed latest lipid panel from 07/2019: LDL at goal, as are the rest of the fractions Lab Results  Component Value Date   CHOL 155 08/01/2019   HDL 43.20 08/01/2019   LDLCALC 94 08/01/2019   LDLDIRECT 106.0 10/22/2017   TRIG 88.0 08/01/2019   CHOLHDL 4 08/01/2019  -Continues Lipitor 20 without side effects  He did not stop at the lab.  We will check TFTs at next visit.  Carlus Pavlov, MD PhD [facility] Endocrin[facility]

## 2019-12-09 DIAGNOSIS — E109 Type 1 diabetes mellitus without complications: Secondary | ICD-10-CM | POA: Diagnosis not present

## 2019-12-09 DIAGNOSIS — Z794 Long term (current) use of insulin: Secondary | ICD-10-CM | POA: Diagnosis not present

## 2019-12-09 DIAGNOSIS — E1065 Type 1 diabetes mellitus with hyperglycemia: Secondary | ICD-10-CM | POA: Diagnosis not present

## 2020-01-09 DIAGNOSIS — E109 Type 1 diabetes mellitus without complications: Secondary | ICD-10-CM | POA: Diagnosis not present

## 2020-01-09 DIAGNOSIS — Z794 Long term (current) use of insulin: Secondary | ICD-10-CM | POA: Diagnosis not present

## 2020-01-09 DIAGNOSIS — E1065 Type 1 diabetes mellitus with hyperglycemia: Secondary | ICD-10-CM | POA: Diagnosis not present

## 2020-02-13 DIAGNOSIS — Z794 Long term (current) use of insulin: Secondary | ICD-10-CM | POA: Diagnosis not present

## 2020-02-13 DIAGNOSIS — E1065 Type 1 diabetes mellitus with hyperglycemia: Secondary | ICD-10-CM | POA: Diagnosis not present

## 2020-02-13 DIAGNOSIS — E109 Type 1 diabetes mellitus without complications: Secondary | ICD-10-CM | POA: Diagnosis not present

## 2020-04-06 ENCOUNTER — Other Ambulatory Visit: Payer: Self-pay

## 2020-04-06 ENCOUNTER — Encounter: Payer: Self-pay | Admitting: Internal Medicine

## 2020-04-06 ENCOUNTER — Ambulatory Visit: Payer: BC Managed Care – PPO | Admitting: Internal Medicine

## 2020-04-06 VITALS — BP 160/90 | HR 70 | Ht 75.0 in | Wt 231.5 lb

## 2020-04-06 DIAGNOSIS — E785 Hyperlipidemia, unspecified: Secondary | ICD-10-CM

## 2020-04-06 DIAGNOSIS — E1065 Type 1 diabetes mellitus with hyperglycemia: Secondary | ICD-10-CM

## 2020-04-06 DIAGNOSIS — E890 Postprocedural hypothyroidism: Secondary | ICD-10-CM

## 2020-04-06 LAB — POCT GLYCOSYLATED HEMOGLOBIN (HGB A1C): Hemoglobin A1C: 7.3 % — AB (ref 4.0–5.6)

## 2020-04-06 LAB — T4, FREE: Free T4: 1.07 ng/dL (ref 0.60–1.60)

## 2020-04-06 LAB — TSH: TSH: 8.46 u[IU]/mL — ABNORMAL HIGH (ref 0.35–4.50)

## 2020-04-06 MED ORDER — GLUCAGON 3 MG/DOSE NA POWD: 3.0000 mg | Freq: Once | NASAL | 11 refills | Status: DC | PRN

## 2020-04-06 NOTE — Progress Notes (Signed)
Patient ID: Willie Spencer, male   DOB: 01-24-1966, 54 y.o.   MRN: [id number]   This visit occurred during the SARS-CoV-2 public health emergency.  Safety protocols were in place, including screening questions prior to the visit, additional usage of staff PPE, and extensive cleaning of exam room while observing appropriate contact time as indicated for disinfecting solutions.   HPI: Willie Spencer is a 54 y.o.-year-old male, returning for f/u for DM1, dx 1987, uncontrolled, without complications and post ablative hypothyroidism. He saw Dr. Tedd Sias in the past, last visit 2015.  Last visit with me 4 months ago  In the last month, he ate more as he had cookouts and celebrations.  Sugars are little higher.  Insulin pump: -OmniPod-started 09/16/2016 -She switched to Omni pod Dashbut this was not covered by his insurance so he is back to his regular OmniPod pump -He would be interested to switch to Medtronic 670 G  CGM: - Dexcom G6 - now off -ran out of sensors, but will restart soon  Insulin: - FiAsp  Supplies: -[facility]  DM1: Reviewed HbA1c levels: Lab Results  Component Value Date   HGBA1C 7.1 (A) 12/06/2019   HGBA1C 7.0 (A) 08/01/2019   HGBA1C 7.6 (A) 03/29/2019   He was on a regimen of: - Lantus 40 units at dinnertime - Humalog 10-15 units 3x a day   Pump settings: - basal rates: 12 am: 1.6 3 am:  1.6 5 am: 1.7 9 am: 1.3 1 pm: 1.4 - ICR: 4 - target: 100-100 - ISF: 35 - Insulin on Board: 4 hours Try to enter at least 20g carbs for coffee if you add the creamer. Try to use a temporary basal rate of 80% when exercising. TDD from basal insulin: 50% >> 46% (32 units) >> 47% >> 44% >> 35.4 units (44-62%) >> 52% (33.3 units) TDD from bolus insulin: 50% >> 54% (37 units) >> 53% >> 56% >> 17-43 units >> 40% (30.4 units) Total daily dose 60.7 units >> up to 80 units - extended bolusing: Not using - changes infusion site: Every 2.5-3 days  He checks his sugars more than 4 times a  day with his Dexcom CGM.  However, at today's visit, we reviewed his sugars checked with a glucometer since she ran out of CGM sensors  CGM parameters: - Average from CGM: 140 +/- 51 >> 165 - GMI: 6.7%  Time in range:  - very low (40-50): 0% >> 0% - low (50-70): 1.8% >> 0% - normal range (70-180): 76.2% >> 62% - high sugars (180-250): 22% >> 27% - very high sugars (250-400): 3.5% >> 11%  Previously:   Lowest sugar was 30s (wife called EMS - ~2013) >> 61 >> 37 (after a manual bolus) >> .Marland KitchenMarland Kitchen 45 after softball >> 63 >> 65; he has hypoglycemia awareness in the 70s.  No previous hypoglycemia or DKA admissions.  He has a glucagon pen at home. Highest sugar was 310 >> 400 (ran out of insulin in the pod) >> 426 >> <300 >> 303  Pt's meals are: - Breakfast: sausage biscuits, orange crackers - Lunch: sandwich; fast food - Dinner: meat + veggies + starch - Snacks: at bedtime; 3: fruit, PB crackers, nuts  -+ Mild CKD, last BUN/creatinine:  Lab Results  Component Value Date   BUN 13 08/01/2019   BUN 12 08/19/2018   CREATININE 1.02 08/01/2019   CREATININE 1.17 08/19/2018  On losartan.  -+ HL; latest set of lipids: Lab Results  Component Value  Date   CHOL 155 08/01/2019   HDL 43.20 08/01/2019   LDLCALC 94 08/01/2019   LDLDIRECT 106.0 10/22/2017   TRIG 88.0 08/01/2019   CHOLHDL 4 08/01/2019  On Lipitor 20.  - last eye exam was in 2018: No DR  - no numbness and tingling in his feet.  Post ablative hypothyroidism: -History of Graves' disease, status post RAI treatment 07/2016  His TFTs are fluctuating: Lab Results  Component Value Date   TSH 6.27 (H) 08/01/2019   TSH 2.14 12/02/2018   TSH 5.26 (H) 08/19/2018   TSH 11.52 (H) 01/20/2018   TSH 18.25 (H) 10/22/2017   TSH 7.52 (H) 03/10/2017   TSH 25.34 (H) 10/27/2016   TSH 8.46 (H) 07/30/2016   TSH 61.14 (H) 01/29/2016   TSH 1.697 07/30/2015   FREET4 1.14 08/01/2019   FREET4 1.25 12/02/2018   FREET4 1.11 08/19/2018    FREET4 0.97 01/20/2018   FREET4 0.90 10/22/2017   FREET4 0.97 03/10/2017   FREET4 0.42 (L) 10/27/2016   FREET4 1.2 07/30/2016   Pt is on levothyroxine 200 mcg daily, taken: - in am - fasting - at least 30 min from b'fast - no Ca, Fe, PPIs - + MVIs in pm - not on Biotin  Pt denies: - feeling nodules in neck - hoarseness - dysphagia - choking - SOB with lying down  ROS: Constitutional: no weight gain/no weight loss, no fatigue, no subjective hyperthermia, no subjective hypothermia Eyes: no blurry vision, no xerophthalmia ENT: no sore throat, + see HPI Cardiovascular: no CP/no SOB/no palpitations/no leg swelling Respiratory: no cough/no SOB/no wheezing Gastrointestinal: no N/no V/no D/no C/no acid reflux Musculoskeletal: no muscle aches/no joint aches Skin: no rashes, no hair loss Neurological: no tremors/no numbness/no tingling/no dizziness  I reviewed pt's medications, allergies, PMH, social hx, family hx, and changes were documented in the history of present illness. Otherwise, unchanged from my initial visit note.  Past Medical History:  Diagnosis Date  . Diabetes mellitus without complication (HCC)   . Hypertension   . Thyroid disease    No past surgical history on file. Social History   Social History  . Marital status: Married    Spouse name: N/A  . Number of children: 2   Occupational History  .  Maintenance supervisor   Social History Main Topics  . Smoking status: Current Every Day Smoker    Packs/day: 1.00    Types: Cigarettes    Last attempt to quit: 08/30/2015  . Smokeless tobacco: Never Used     Comment: uses ecigs  . Alcohol use      Comment: 6 beers per weekend  . Drug use: No   Current Outpatient Medications on File Prior to Visit  Medication Sig Dispense Refill  . atorvastatin (LIPITOR) 20 MG tablet TAKE 1 TABLET BY MOUTH EVERY DAY 90 tablet 3  . Continuous Blood Gluc Transmit (DEXCOM G6 TRANSMITTER) MISC 1 Device by Does not apply route  every 3 (three) months. 1 each 3  . CONTOUR NEXT TEST test strip USE AS INSTRUCTED TO TEST SUGARS 4 TIMES DAILY 350 strip 4  . glucagon (GLUCAGEN) 1 MG SOLR injection Inject 1 mg into the muscle once as needed for up to 1 dose for low blood sugar. 1 each 11  . Insulin Aspart, w/Niacinamide, (FIASP) 100 UNIT/ML SOLN Inject 80 Units into the skin daily. In insulin pump. 90 mL 3  . Insulin Pen Needle 32G X 6 MM MISC To use with insulin pen. 100 each  4  . levothyroxine (SYNTHROID) 200 MCG tablet TAKE 1 TABLET (200 MCG TOTAL) BY MOUTH DAILY BEFORE BREAKFAST. 90 tablet 3  . losartan (COZAAR) 25 MG tablet TAKE 1 TABLET BY MOUTH DAILY 30 tablet 1  . NOVOLOG FLEXPEN 100 UNIT/ML FlexPen USE TO INJECT 10-15 UNITS 3 TIMES A DAY. 15 pen 1  . ranitidine (ZANTAC) 150 MG tablet Take 1 tablet (150 mg total) by mouth 2 (two) times daily. 20 tablet 0   No current facility-administered medications on file prior to visit.   Allergies  Allergen Reactions  . Wellbutrin [Bupropion]     Gum swelling  . Levemir [Insulin Detemir] Rash   Family History  Problem Relation Age of Onset  . Diabetes Paternal Grandfather   . Stroke Paternal Grandfather   . Heart disease Paternal Grandfather    PE: BP (!) 160/90 (BP Location: Left Arm, Patient Position: Sitting, Cuff Size: Normal)   Pulse 70   Ht 6\' 3"  (1.905 m)   Wt 231 lb 8 oz (105 kg)   SpO2 98%   BMI 28.94 kg/m  Wt Readings from Last 3 Encounters:  04/06/20 231 lb 8 oz (105 kg)  12/06/19 239 lb (108.4 kg)  08/01/19 239 lb (108.4 kg)   Constitutional: overweight, in NAD Eyes: PERRLA, EOMI, no exophthalmos ENT: moist mucous membranes, no thyromegaly, no cervical lymphadenopathy Cardiovascular: RRR, No MRG Respiratory: CTA B Gastrointestinal: abdomen soft, NT, ND, BS+ Musculoskeletal: no deformities, strength intact in all 4 Skin: moist, warm, no rashes Neurological: no tremor with outstretched hands, DTR normal in all 4  ASSESSMENT: 1. DM1,  uncontrolled, without Long term complications, but with hyperglycemia  2. Post-ablative hypothyroidism  3.  Hyperlipidemia  PLAN:  1. Patient with longstanding, uncontrolled, type 1 diabetes, on insulin and CGM.  He lost his Dexcom CGM but this is now off.  During the summer, he is using more sensors than normal due to sweating and the sensors are coming down before 10 days.  Therefore, he ran out of sensors is waiting for another batch before he can restart.  He is not checking sugars with his meter.  They are fluctuating, but he does not have any significant lows or sugars higher than upper 200s.  He only had 1 300 after a cookout. -Reviewing his pump download, it appears that he is not introducing enough carbs into his pump so sugars are occasionally higher after meals.  He then tries to enter carbs again in a staggering way but this is not covering the postprandial hyperglycemia well.  We discussed about the importance of introducing all the carbs in the beginning of the meal.  I offered to refer him to nutrition for carb counting refresher at today's visit, but he refuses this for now. -I do not feel he needs other changes in his regimen for now. -refilled Glucagon - intranasal -I recommended to: Patient Instructions  Please continue: - basal rates: 12 am: 1.6 3 am:  1.6 5 am: 1.7 9 am: 1.3 1 pm: 1.4 - ICR: 4 - target: 100-100 - ISF: 35 - Insulin on Board: 4 hours  Try to enter ALL carbs at the beginning of the meal.  Try to enter at least 20g carbs for coffee if you add the creamer. Try to use a temporary basal rate of 80% when exercising.  Please continue levothyroxine 200 mcg daily.  Take the thyroid hormone every day, with water, at least 30 minutes before breakfast, separated by at least 4 hours from: -  acid reflux medications - calcium - iron - multivitamins   Please stop at the lab.  - we checked his HbA1c: 7.6% (higher) - advised to check sugars at different  times of the day - 4x a day, rotating check times - advised for yearly eye exams >> he is not UTD - return to clinic in 4 months     2. Post-ablative hypothyroidism -Uncontrolled - latest thyroid labs reviewed with pt >> TSH was slightly high but we did not change the dose of levothyroxine since he is already on a high dose and also due to previous variability in his TFTs: Lab Results  Component Value Date   TSH 6.27 (H) 08/01/2019   - he continues on LT4 200 mcg daily - pt feels good on this dose. - we discussed about taking the thyroid hormone every day, with water, >30 minutes before breakfast, separated by >4 hours from acid reflux medications, calcium, iron, multivitamins. Pt. is taking it correctly. - will check thyroid tests today: TSH and fT4 (he did not stop at the lab at last visit) - If labs are abnormal, he will need to return for repeat TFTs in 1.5 months  3. Hyperlipidemia  -Reviewed latest lipid panel from 07/2019: LDL at goal, as are the rest of his fractions: Lab Results  Component Value Date   CHOL 155 08/01/2019   HDL 43.20 08/01/2019   LDLCALC 94 08/01/2019   LDLDIRECT 106.0 10/22/2017   TRIG 88.0 08/01/2019   CHOLHDL 4 08/01/2019  -Continues Lipitor 20 without side effects   Component     Latest Ref Rng & Units 04/06/2020  TSH     0.35 - 4.50 uIU/mL 8.46 (H)  T4,Free(Direct)     0.60 - 1.60 ng/dL 1.611.07  I suspect he missed some levothyroxine doses.  We will recheck at next visit.  I will advise him to take the thyroid medication consistently.  Carlus Pavlovristina Kallen Delatorre, MD PhD [facility]

## 2020-04-06 NOTE — Patient Instructions (Signed)
Please continue: - basal rates: 12 am: 1.6 3 am:  1.6 5 am: 1.7 9 am: 1.3 1 pm: 1.4 - ICR: 4 - target: 100-100 - ISF: 35 - Insulin on Board: 4 hours  Try to enter ALL carbs at the beginning of the meal.  Try to enter at least 20g carbs for coffee if you add the creamer. Try to use a temporary basal rate of 80% when exercising.  Please continue levothyroxine 200 mcg daily.  Take the thyroid hormone every day, with water, at least 30 minutes before breakfast, separated by at least 4 hours from: - acid reflux medications - calcium - iron - multivitamins   Please stop at the lab.

## 2020-04-09 ENCOUNTER — Telehealth: Payer: Self-pay

## 2020-04-09 NOTE — Telephone Encounter (Signed)
-----   Message from Willie Pavlov, MD sent at 04/06/2020  4:42 PM EDT ----- Efraim Kaufmann, can you please call pt: TSH is a little high, but I suspect that this is due to some missed levothyroxine doses.  Please take the levothyroxine consistently and we will recheck this again at next visit.

## 2020-04-09 NOTE — Telephone Encounter (Signed)
ATC patient and VM is full and no answer.

## 2020-04-10 NOTE — Telephone Encounter (Signed)
ATC again and went straight to VM that is full

## 2020-04-16 DIAGNOSIS — E109 Type 1 diabetes mellitus without complications: Secondary | ICD-10-CM | POA: Diagnosis not present

## 2020-04-16 DIAGNOSIS — Z794 Long term (current) use of insulin: Secondary | ICD-10-CM | POA: Diagnosis not present

## 2020-04-16 DIAGNOSIS — E1065 Type 1 diabetes mellitus with hyperglycemia: Secondary | ICD-10-CM | POA: Diagnosis not present

## 2020-05-06 ENCOUNTER — Other Ambulatory Visit: Payer: Self-pay | Admitting: Internal Medicine

## 2020-05-07 ENCOUNTER — Telehealth: Payer: Self-pay | Admitting: Internal Medicine

## 2020-05-07 MED ORDER — FREESTYLE LIBRE 14 DAY SENSOR MISC: 2 refills | Status: DC

## 2020-05-07 NOTE — Telephone Encounter (Signed)
Chart reviewed, our records shows he is using Dexcom.  Contacted patient to clarify request regarding a refill for [place] as we do not have an active prescription listed for him, he states it is not a refill per say, but a new prescription for [facility] because the Dexcom sensor adhesive has been causing rashes.   I let him know we will D/C the dexcom and send a new order for the [place] sensors ( he has one as a free trial and worked well) if his insurance does not want to cover it due to preferring Dexcom then we will need to do a PA.

## 2020-05-07 NOTE — Telephone Encounter (Signed)
Medication Rx Request  Did you call your pharmacy and request this refill first? Yes  . If patient has not contacted pharmacy first, instruct them to do so for future refills.  . Remind them that contacting the pharmacy for their refill is the quickest method to get the refill.  . Refill policy also stated that it will take anywhere between 24-72 hours to receive the refill.    Name of medication? Libre 14 day sensors  Is this a 90 day supply? yes  Name and location of pharmacy?   [id number], [place] - [address] Phone:  [phone]  Fax:  [phone]

## 2020-07-17 DIAGNOSIS — Z794 Long term (current) use of insulin: Secondary | ICD-10-CM | POA: Diagnosis not present

## 2020-07-17 DIAGNOSIS — E109 Type 1 diabetes mellitus without complications: Secondary | ICD-10-CM | POA: Diagnosis not present

## 2020-07-17 DIAGNOSIS — E1065 Type 1 diabetes mellitus with hyperglycemia: Secondary | ICD-10-CM | POA: Diagnosis not present

## 2020-08-09 ENCOUNTER — Other Ambulatory Visit: Payer: Self-pay

## 2020-08-09 ENCOUNTER — Encounter: Payer: Self-pay | Admitting: Internal Medicine

## 2020-08-09 ENCOUNTER — Ambulatory Visit: Payer: BC Managed Care – PPO | Admitting: Internal Medicine

## 2020-08-09 VITALS — BP 140/82 | HR 77 | Ht 75.0 in | Wt 244.2 lb

## 2020-08-09 DIAGNOSIS — E785 Hyperlipidemia, unspecified: Secondary | ICD-10-CM

## 2020-08-09 DIAGNOSIS — E1065 Type 1 diabetes mellitus with hyperglycemia: Secondary | ICD-10-CM | POA: Diagnosis not present

## 2020-08-09 DIAGNOSIS — E890 Postprocedural hypothyroidism: Secondary | ICD-10-CM | POA: Diagnosis not present

## 2020-08-09 LAB — LIPID PANEL
Cholesterol: 132 mg/dL (ref 0–200)
HDL: 43.4 mg/dL (ref >39.00)
LDL Cholesterol: 58 mg/dL (ref 0–99)
NonHDL: 88.89
Total CHOL/HDL Ratio: 3
Triglycerides: 153 mg/dL — ABNORMAL HIGH (ref 0.0–149.0)
VLDL: 30.6 mg/dL (ref 0.0–40.0)

## 2020-08-09 LAB — COMPREHENSIVE METABOLIC PANEL
ALT: 17 U/L (ref 0–53)
AST: 19 U/L (ref 0–37)
Albumin: 4.2 g/dL (ref 3.5–5.2)
Alkaline Phosphatase: 76 U/L (ref 39–117)
BUN: 14 mg/dL (ref 6–23)
CO2: 32 mEq/L (ref 19–32)
Calcium: 8.8 mg/dL (ref 8.4–10.5)
Chloride: 99 mEq/L (ref 96–112)
Creatinine, Ser: 0.94 mg/dL (ref 0.40–1.50)
GFR: 93.13 mL/min (ref >60.00)
Glucose, Bld: 110 mg/dL — ABNORMAL HIGH (ref 70–99)
Potassium: 4.4 mEq/L (ref 3.5–5.1)
Sodium: 137 mEq/L (ref 135–145)
Total Bilirubin: 0.6 mg/dL (ref 0.2–1.2)
Total Protein: 6.5 g/dL (ref 6.0–8.3)

## 2020-08-09 LAB — POCT GLYCOSYLATED HEMOGLOBIN (HGB A1C): Hemoglobin A1C: 7.2 % — AB (ref 4.0–5.6)

## 2020-08-09 LAB — MICROALBUMIN / CREATININE URINE RATIO
Creatinine,U: 76.7 mg/dL
Microalb Creat Ratio: 0.9 mg/g (ref 0.0–30.0)
Microalb, Ur: <0.7 mg/dL (ref 0.0–1.9)

## 2020-08-09 LAB — TSH: TSH: 9.79 u[IU]/mL — ABNORMAL HIGH (ref 0.35–4.50)

## 2020-08-09 MED ORDER — FREESTYLE LIBRE 2 SENSOR MISC: 1.0000 | 3 refills | Status: DC

## 2020-08-09 MED ORDER — FREESTYLE LIBRE 2 READER DEVI: 1.0000 | Freq: Every day | 0 refills | Status: DC

## 2020-08-09 NOTE — Patient Instructions (Addendum)
Please continue: - basal rates: 12 am: 1.6 >> 1.8 3 am:  1.6 >> 1.7 5 am: 1.7 9 am: 1.3 1 pm: 1.4 - ICR: 4 - target: 100-100 - ISF: 35 - Insulin on Board: 4 hours  Try to enter ALL carbs at the beginning of the meal. Try to enter at least 20g carbs for coffee if you add the creamer. Try to use a temporary basal rate of 80% when exercising.  Please continue levothyroxine 200 mcg daily.  Take the thyroid hormone every day, with water, at least 30 minutes before breakfast, separated by at least 4 hours from: - acid reflux medications - calcium - iron - multivitamins   Please stop at the lab.

## 2020-08-09 NOTE — Progress Notes (Signed)
Patient ID: Willie Spencer, male   DOB: Aug 05, 1966, 54 y.o.   MRN: [id number]   This visit occurred during the SARS-CoV-2 public health emergency.  Safety protocols were in place, including screening questions prior to the visit, additional usage of staff PPE, and extensive cleaning of exam room while observing appropriate contact time as indicated for disinfecting solutions.   HPI: Willie Spencer is a 54 y.o.-year-old male, returning for f/u for DM1, dx 1987, uncontrolled, without complications and post ablative hypothyroidism. He saw Dr. Gabriel Carina in the past, last visit 2015.  Last visit with me 4 months ago.  He had Covid19 in 04/2020. He used HOChloroquine and Prednisone >> he recovered well.  Insulin pump: -OmniPod-started 09/16/2016 -She switched to Omni pod Dash, but this was not covered by his insurance so he is back to his regular OmniPod pump -He would be interested to switch to Medtronic 670 G  CGM: -Dexcom G6 - but has a severe erythematous rash reaction to the adhesive despite using barrier tape  Insulin: -FiAsp  Supplies: -[facility]  DM1: Reviewed HbA1c levels: Lab Results  Component Value Date   HGBA1C 7.3 (A) 04/06/2020   HGBA1C 7.1 (A) 12/06/2019   HGBA1C 7.0 (A) 08/01/2019   HGBA1C 7.6 (A) 03/29/2019   HGBA1C 7.4 (A) 12/02/2018   HGBA1C 7.3 (A) 08/19/2018   HGBA1C 7.0 01/20/2018   HGBA1C 7.7 10/22/2017   HGBA1C 7.4 03/10/2017   HGBA1C 7.2 10/27/2016   HGBA1C 7.6% 07/30/2016   HGBA1C 7.4% 01/29/2016   HGBA1C 8.1% 10/24/2015   HGBA1C 6.3% 07/09/2015   He was on a regimen of: - Lantus 40 units at dinnertime - Humalog 10-15 units 3x a day   Pump settings: - basal rates: 12 am: 1.6 3 am:  1.6 5 am: 1.7 9 am: 1.3 1 pm: 1.4 - ICR: 4 - target: 100-100 - ISF: 35 - Insulin on Board: 4 hours Try to enter at least 20g carbs for coffee if you add the creamer. Try to use a temporary basal rate of 80% when exercising. TDD from basal insulin: 35.4 units (44-62%)  >> 52% (33.3 units) >> 45-60% TDD from bolus insulin: 17-43 units >> 40% (30.4 units) >> 31 to 61% Total daily dose 60.7 units >> up to 80 units - extended bolusing: Not using - changes infusion site: Every 2.5 to 3 days.  He checks his sugars more than 4 times a day with his Dexcom CGM.  CGM parameters: - Average from CGM: 140 +/- 51 >> 165 >> 161+/-61 - Coefficient of variation: 30% - Sensor wear: 93% of the time - GMI: 6.7% >> n/c  Time in range:  - very low (40-50): 0% >> 0% >> 0.4% - low (50-70): 1.8% >> 0% >> 1.8% - normal range (70-180): 76.2% >> 62% >> 66.9% - high sugars (180-250): 22% >> 27% >> 31.3% - very high sugars (250-400): 3.5% >> 11% >> 8%    Previously:   Lowest sugar was 30s (wife called EMS - ~2013) >>... 37 (after a manual bolus) >> ... 65 >> 46 (more active); he has hypoglycemia awareness in the 70s.  No previous hypoglycemia or DKA admissions.  He has a glucagon kit at home. Highest sugar was 426 >> <300 >> 303 >> >400 (cake).  Pt's meals are: - Breakfast: sausage biscuits, orange crackers - Lunch: sandwich; fast food - Dinner: meat + veggies + starch - Snacks: at bedtime; 3: fruit, PB crackers, nuts  -+ Mild CKD, last BUN/creatinine:  Lab Results  Component Value Date   BUN 13 08/01/2019   BUN 12 08/19/2018   CREATININE 1.02 08/01/2019   CREATININE 1.17 08/19/2018  On losartan.  -+ HL; latest set of lipids: Lab Results  Component Value Date   CHOL 155 08/01/2019   HDL 43.20 08/01/2019   LDLCALC 94 08/01/2019   LDLDIRECT 106.0 10/22/2017   TRIG 88.0 08/01/2019   CHOLHDL 4 08/01/2019  On Lipitor 20.  - last eye exam was in 2018: No DR  - no numbness and tingling in his feet.  Post ablative hypothyroidism: -History of Graves' disease, status post RAI treatment 07/2016  Pt is on levothyroxine 200 mcg daily, taken: - in am - fasting - at least 30 min from b'fast - no calcium - no iron - + multivitamins in p.m. - no PPIs - not  on Biotin  Reviewed his TFTs: Lab Results  Component Value Date   TSH 8.46 (H) 04/06/2020   TSH 6.27 (H) 08/01/2019   TSH 2.14 12/02/2018   TSH 5.26 (H) 08/19/2018   TSH 11.52 (H) 01/20/2018   TSH 18.25 (H) 10/22/2017   TSH 7.52 (H) 03/10/2017   TSH 25.34 (H) 10/27/2016   TSH 8.46 (H) 07/30/2016   TSH 61.14 (H) 01/29/2016   FREET4 1.07 04/06/2020   FREET4 1.14 08/01/2019   FREET4 1.25 12/02/2018   FREET4 1.11 08/19/2018   FREET4 0.97 01/20/2018   FREET4 0.90 10/22/2017   FREET4 0.97 03/10/2017   FREET4 0.42 (L) 10/27/2016   FREET4 1.2 07/30/2016   Pt denies: - feeling nodules in neck - hoarseness - dysphagia - choking - SOB with lying down  ROS: Constitutional: no weight gain/no weight loss, no fatigue, no subjective hyperthermia, no subjective hypothermia Eyes: no blurry vision, no xerophthalmia ENT: no sore throat, + see HPI Cardiovascular: no CP/no SOB/no palpitations/no leg swelling Respiratory: no cough/no SOB/no wheezing Gastrointestinal: no N/no V/no D/no C/no acid reflux Musculoskeletal: no muscle aches/no joint aches Skin: no rashes, no hair loss Neurological: no tremors/no numbness/no tingling/no dizziness  I reviewed pt's medications, allergies, PMH, social hx, family hx, and changes were documented in the history of present illness. Otherwise, unchanged from my initial visit note.  Past Medical History:  Diagnosis Date  . Diabetes mellitus without complication ([place])   . Hypertension   . Thyroid disease    No past surgical history on file. Social History   Social History  . Marital status: Married    Spouse name: N/A  . Number of children: 2   Occupational History  .  Maintenance supervisor   Social History Main Topics  . Smoking status: Current Every Day Smoker    Packs/day: 1.00    Types: Cigarettes    Last attempt to quit: 08/30/2015  . Smokeless tobacco: Never Used     Comment: uses ecigs  . Alcohol use      Comment: 6 beers per  weekend  . Drug use: No   Current Outpatient Medications on File Prior to Visit  Medication Sig Dispense Refill  . atorvastatin (LIPITOR) 20 MG tablet TAKE 1 TABLET BY MOUTH EVERY DAY 90 tablet 3  . Continuous Blood Gluc Sensor (FREESTYLE LIBRE 14 DAY SENSOR) MISC Use for continuous glucose monitoring. 6 each 2  . CONTOUR NEXT TEST test strip USE AS INSTRUCTED TO TEST SUGARS 4 TIMES DAILY 350 strip 4  . glucagon (GLUCAGEN) 1 MG SOLR injection Inject 1 mg into the muscle once as needed for up to 1 dose  for low blood sugar. 1 each 11  . Glucagon 3 MG/DOSE POWD Place 3 mg into the nose once as needed for up to 1 dose. 1 each 11  . Insulin Aspart, w/Niacinamide, (FIASP) 100 UNIT/ML SOLN Inject 80 Units into the skin daily. In insulin pump. 90 mL 3  . Insulin Pen Needle 32G X 6 MM MISC To use with insulin pen. 100 each 4  . levothyroxine (SYNTHROID) 200 MCG tablet TAKE 1 TABLET (200 MCG TOTAL) BY MOUTH DAILY BEFORE BREAKFAST. 90 tablet 3  . losartan (COZAAR) 25 MG tablet TAKE 1 TABLET BY MOUTH DAILY 30 tablet 1  . NOVOLOG FLEXPEN 100 UNIT/ML FlexPen USE TO INJECT 10-15 UNITS 3 TIMES A DAY. 15 pen 1   No current facility-administered medications on file prior to visit.   Allergies  Allergen Reactions  . Wellbutrin [Bupropion]     Gum swelling  . Levemir [Insulin Detemir] Rash   Family History  Problem Relation Age of Onset  . Diabetes Paternal Grandfather   . Stroke Paternal Grandfather   . Heart disease Paternal Grandfather    PE: BP 140/82   Pulse 77   Ht 6' 3"  (1.905 m)   Wt 244 lb 3.2 oz (110.8 kg)   SpO2 97%   BMI 30.52 kg/m  Wt Readings from Last 3 Encounters:  08/09/20 244 lb 3.2 oz (110.8 kg)  04/06/20 231 lb 8 oz (105 kg)  12/06/19 239 lb (108.4 kg)   Constitutional: overweight, in NAD Eyes: PERRLA, EOMI, no exophthalmos ENT: moist mucous membranes, no thyromegaly, no cervical lymphadenopathy Cardiovascular: RRR, No MRG Respiratory: CTA B Gastrointestinal: abdomen  soft, NT, ND, BS+ Musculoskeletal: no deformities, strength intact in all 4 Skin: moist, warm, + scaly, silvery ,rash at the site of the Dexcom CGM:  Neurological: no tremor with outstretched hands, DTR normal in all 4  ASSESSMENT: 1. DM1, uncontrolled, without Long term complications, but with hyperglycemia  2. Post-ablative hypothyroidism  3.  Hyperlipidemia  PLAN:  1. Patient with longstanding, uncontrolled, type 1 diabetes, on insulin pump but without a CGM.  She was previously on a Dexcom CGM but he had problems during the summer due to sweating and the sensors coming up before 10 days.  Therefore, he ran out of sensors before last visit and could not restart.  At that time, he was checking blood sugars with his meter.  He was not introducing enough carbs into the pump at last visit, so sugars are occasionally higher after meals.  He was entering carbs in the staggering way, but this was not covering his postprandial hyperglycemia well and I advised him to enter the entire amount of carbs that he plans to eat before the meal. I offered to refer him to nutrition for carb counting refresher at today's visit, but he refused. CGM interpretation: -At today's visit, we reviewed her CGM downloads: It appears that 67% % of values are in target range compared to 62% at last visit, while 31.3% % are higher than 180, and 1.8% are lower than 70.  The proportion of blood sugars that are higher than 250 decreased from 11% at last visit to 8% now. The calculated average blood sugar is 161, slightly better than before.  -Reviewing the CGM trends, his sugars appear to be higher than target usually during the night and they improve after 8 AM.  It is unclear why her sugars stay elevated during the night, since sugars after dinner are not always high.  They appeared  to increase around 12 AM.  Therefore, I advised him to increase his basal rates from 12 AM to 5 AM.  During the day, sugars are better, mostly in the  normal range, with hyperglycemic exceptions after lunch and dinner.  These are not consistent, though, so I would not suggest to change his insulin to carb ratio for now.  Moreover, he is still not entering quite all his carbs into the pump but he is working on improving this.  Reviewing his pump download for the last 2 weeks, he is doing a better job with this. -Regarding his rash, this can be due to allergy to the CGM sensor.  He tried to use a skin protectant membrane, without results.  He is interested in switching to freestyle libre 2 CGM.  He tried the freestyle libre CGM but this did not give him alarms that this was suboptimal for him.  He did notice that this did not irritate his skin, though.  Also, the OmniPod pump is not irritating his skin. He loves the Dexcom CGM so if he can continue to wear it, he would prefer to do so. Upon his request, I  also refered him to dermatology.   -I recommended to: Patient Instructions  Please continue: - basal rates: 12 am: 1.6 >> 1.8 3 am:  1.6 >> 1.7 5 am: 1.7 9 am: 1.3 1 pm: 1.4 - ICR: 4 - target: 100-100 - ISF: 35 - Insulin on Board: 4 hours  Try to enter ALL carbs at the beginning of the meal. Try to enter at least 20g carbs for coffee if you add the creamer. Try to use a temporary basal rate of 80% when exercising.  Please continue levothyroxine 200 mcg daily.  Take the thyroid hormone every day, with water, at least 30 minutes before breakfast, separated by at least 4 hours from: - acid reflux medications - calcium - iron - multivitamins   Please stop at the lab.  - we checked his HbA1c: 7.2% (slightly lower) - advised to check sugars at different times of the day - 4x a day, rotating check times - advised for yearly eye exams >> he is not UTD -strongly advised him to schedule this. - We will check annual labs today - return to clinic in 4 months    2. Post-ablative hypothyroidism -Uncontrolled - latest thyroid labs reviewed  with pt >> TSH is elevated, possibly due to missed doses: Lab Results  Component Value Date   TSH 8.46 (H) 04/06/2020   - he continues on LT4 200 mcg daily - pt feels good on this dose. - we discussed about taking the thyroid hormone every day, with water, >30 minutes before breakfast, separated by >4 hours from acid reflux medications, calcium, iron, multivitamins. Pt. is taking it correctly. - will check thyroid tests today: TSH and fT4 - If labs are abnormal, he will need to return for repeat TFTs in 1.5 months  3. Hyperlipidemia  -Reviewed latest lipid panel from 07/2019: LDL at goal, as are the rest of his lipid fractions: Lab Results  Component Value Date   CHOL 155 08/01/2019   HDL 43.20 08/01/2019   LDLCALC 94 08/01/2019   LDLDIRECT 106.0 10/22/2017   TRIG 88.0 08/01/2019   CHOLHDL 4 08/01/2019  -Continues Lipitor 20 without side effects -He is due for another lipid panel -we will check today  Component     Latest Ref Rng & Units 08/09/2020  Sodium     135 -  145 mEq/L 137  Potassium     3.5 - 5.1 mEq/L 4.4  Chloride     96 - 112 mEq/L 99  CO2     19 - 32 mEq/L 32  Glucose     70 - 99 mg/dL 110 (H)  BUN     6 - 23 mg/dL 14  Creatinine     0.40 - 1.50 mg/dL 0.94  Total Bilirubin     0.2 - 1.2 mg/dL 0.6  Alkaline Phosphatase     39 - 117 U/L 76  AST     0 - 37 U/L 19  ALT     0 - 53 U/L 17  Total Protein     6.0 - 8.3 g/dL 6.5  Albumin     3.5 - 5.2 g/dL 4.2  GFR     >60.00 mL/min 93.13  Calcium     8.4 - 10.5 mg/dL 8.8  Cholesterol     0 - 200 mg/dL 132  Triglycerides     0 - 149 mg/dL 153.0 (H)  HDL Cholesterol     >39.00 mg/dL 43.40  VLDL     0.0 - 40.0 mg/dL 30.6  LDL (calc)     0 - 99 mg/dL 58  Total CHOL/HDL Ratio      3  NonHDL      88.89  Microalb, Ur     0.0 - 1.9 mg/dL <0.7  Creatinine,U     mg/dL 76.7  MICROALB/CREAT RATIO     0.0 - 30.0 mg/g 0.9  TSH     0.35 - 4.50 uIU/mL 9.79 (H)  T4,Free(Direct)     0.60 - 1.60 ng/dL  1.04   Labs are excellent with the exception of a still high TSH.  We will increase the dose of levothyroxine to 225 mcg daily and recheck his TFTs at next visit.  Philemon Kingdom, MD PhD [facility] Endocrinology

## 2020-08-10 LAB — T4, FREE: Free T4: 1.04 ng/dL (ref 0.60–1.60)

## 2020-08-10 MED ORDER — LEVOTHYROXINE SODIUM 25 MCG PO TABS: 25.0000 ug | ORAL_TABLET | Freq: Every day | ORAL | 3 refills | Status: DC

## 2020-08-10 NOTE — Progress Notes (Signed)
Called patient, LVM to call back to discuss lab work. Left call back number.  

## 2020-08-14 DIAGNOSIS — Z794 Long term (current) use of insulin: Secondary | ICD-10-CM | POA: Diagnosis not present

## 2020-08-14 DIAGNOSIS — E109 Type 1 diabetes mellitus without complications: Secondary | ICD-10-CM | POA: Diagnosis not present

## 2020-08-14 DIAGNOSIS — E1065 Type 1 diabetes mellitus with hyperglycemia: Secondary | ICD-10-CM | POA: Diagnosis not present

## 2020-08-16 ENCOUNTER — Other Ambulatory Visit: Payer: Self-pay | Admitting: Internal Medicine

## 2020-08-16 MED ORDER — FREESTYLE TEST VI STRP: ORAL_STRIP | 5 refills | Status: DC

## 2020-08-16 NOTE — Telephone Encounter (Signed)
Refill sent as requested. 

## 2020-08-16 NOTE — Telephone Encounter (Signed)
REFILL REQUEST:  Freestyle Libre test strips   [phone] [place] [facility], [place] - [address] [place] [address]  Phone:  [phone] Fax:  [phone]

## 2020-09-13 ENCOUNTER — Other Ambulatory Visit: Payer: Self-pay | Admitting: Internal Medicine

## 2020-09-17 DIAGNOSIS — E109 Type 1 diabetes mellitus without complications: Secondary | ICD-10-CM | POA: Diagnosis not present

## 2020-09-17 DIAGNOSIS — E1065 Type 1 diabetes mellitus with hyperglycemia: Secondary | ICD-10-CM | POA: Diagnosis not present

## 2020-09-17 DIAGNOSIS — Z794 Long term (current) use of insulin: Secondary | ICD-10-CM | POA: Diagnosis not present

## 2020-10-05 ENCOUNTER — Telehealth: Payer: Self-pay

## 2020-10-05 DIAGNOSIS — E1065 Type 1 diabetes mellitus with hyperglycemia: Secondary | ICD-10-CM

## 2020-10-05 NOTE — Telephone Encounter (Signed)
Attempting PA for Fiasp Insulin. Awaiting determination

## 2020-10-08 NOTE — Telephone Encounter (Signed)
Determination for request for [place] was denied. Alternatives that are covered are brand Humalog and brand Lyumjev.

## 2020-10-08 NOTE — Telephone Encounter (Signed)
Lyumjev would be a great replacement for FiAsp!

## 2020-10-11 MED ORDER — LYUMJEV 100 UNIT/ML IJ SOLN: INTRAMUSCULAR | 2 refills | Status: DC

## 2020-10-11 MED ORDER — OMNIPOD DASH PODS (GEN 4) MISC: 3 refills | Status: DC

## 2020-10-11 NOTE — Addendum Note (Signed)
Addended by: Kenyon Ana on: 10/11/2020 02:53 PM   Modules accepted: Orders

## 2020-10-11 NOTE — Telephone Encounter (Signed)
Rx sent to pt's preferred pharmacy.

## 2020-10-11 NOTE — Telephone Encounter (Signed)
OK to send the Omnipod to the pharmacy but per my understanding, not all pharmacies carry them. We can send them to he pharmacy he prefers and see from there.

## 2020-10-11 NOTE — Telephone Encounter (Signed)
Called and advised patient new insurance does not cover FiAsp. Rx for Lyumjev will be sent to preferred pharmacy. Pt requested omni pod supplies be sent to preferred pharmacy as well. Wants to see if pharmacy can supply refills?

## 2020-10-30 ENCOUNTER — Telehealth: Payer: Self-pay | Admitting: Internal Medicine

## 2020-10-30 DIAGNOSIS — E1065 Type 1 diabetes mellitus with hyperglycemia: Secondary | ICD-10-CM

## 2020-10-30 MED ORDER — OMNIPOD CLASSIC PODS (GEN 3) MISC: 2 refills | Status: DC

## 2020-10-30 NOTE — Telephone Encounter (Signed)
Called and spoke with pt's wife Judeth Cornfield and was advised Insulin was received by pharmacy and she just needs pt's omnipod pods sent to preferred pharmacy. Correct Rx sent to pharmacy.

## 2020-10-30 NOTE — Telephone Encounter (Signed)
Pt's wife called very upset needing pt's insulin filled. Wife states an Omnipod Sharilyn Sites was filled for pt but pt does not use the [employer].They see that Lyumjev was filled on 1/17 but when she spoke to the pharmacy they said they do not have a prescription. Pt needs insulin because he will be out of town this weekend. Pt's wife wishes to be called by a nurse to get some clarification on what is going on [phone]  [id number] [place], [place] - [address] Phone:  [phone]  Fax:  [phone]

## 2020-11-12 ENCOUNTER — Other Ambulatory Visit: Payer: Self-pay | Admitting: *Deleted

## 2020-11-12 ENCOUNTER — Telehealth: Payer: Self-pay | Admitting: Internal Medicine

## 2020-11-12 DIAGNOSIS — E1065 Type 1 diabetes mellitus with hyperglycemia: Secondary | ICD-10-CM

## 2020-11-12 MED ORDER — LYUMJEV 100 UNIT/ML IJ SOLN: INTRAMUSCULAR | 2 refills | Status: DC

## 2020-11-12 NOTE — Telephone Encounter (Signed)
Rx sent 

## 2020-11-12 NOTE — Telephone Encounter (Signed)
Patient called to request a new RX for 90 day RX of insulin in vial (Patient does not know name of medication) be sent to  [id number] [place], [place] - [address] Phone:  [phone]  Fax:  [phone]

## 2020-12-11 ENCOUNTER — Ambulatory Visit: Payer: Managed Care, Other (non HMO) | Admitting: Internal Medicine

## 2020-12-11 ENCOUNTER — Other Ambulatory Visit: Payer: Self-pay

## 2020-12-11 ENCOUNTER — Encounter: Payer: Self-pay | Admitting: Internal Medicine

## 2020-12-11 VITALS — BP 138/64 | HR 78 | Ht 75.0 in | Wt 240.0 lb

## 2020-12-11 DIAGNOSIS — E890 Postprocedural hypothyroidism: Secondary | ICD-10-CM | POA: Diagnosis not present

## 2020-12-11 DIAGNOSIS — E785 Hyperlipidemia, unspecified: Secondary | ICD-10-CM | POA: Diagnosis not present

## 2020-12-11 DIAGNOSIS — E1065 Type 1 diabetes mellitus with hyperglycemia: Secondary | ICD-10-CM

## 2020-12-11 LAB — POCT GLYCOSYLATED HEMOGLOBIN (HGB A1C): Hemoglobin A1C: 7.3 % — AB (ref 4.0–5.6)

## 2020-12-11 MED ORDER — ATORVASTATIN CALCIUM 20 MG PO TABS: 20.0000 mg | ORAL_TABLET | Freq: Every day | ORAL | 3 refills | Status: DC

## 2020-12-11 MED ORDER — LYUMJEV 100 UNIT/ML IJ SOLN: INTRAMUSCULAR | 3 refills | Status: DC

## 2020-12-11 MED ORDER — LEVOTHYROXINE SODIUM 200 MCG PO TABS
200.0000 ug | ORAL_TABLET | Freq: Every day | ORAL | 3 refills | Status: DC
Start: 2020-12-11 — End: 2021-02-14

## 2020-12-11 NOTE — Patient Instructions (Addendum)
Please change: - basal rates: 12 am: 1.6 >> 1.8 3 am:  1.6 >> 1.7 5 am: 1.7 >> 1.5 9 am: 1.3 1 pm: 1.4 - ICR:  12 am: 4 >> 5 5 pm: 4 - target: 100-100 - ISF: 35 - Insulin on Board: 4 hours  Try to enter ALL carbs at the beginning of the meal. Try to enter at least 20g carbs for coffee if you add the creamer. Try to use a temporary basal rate of 80% when exercising.  Please increase levothyroxine to 225 mcg daily.  Take the thyroid hormone every day, with water, at least 30 minutes before breakfast, separated by at least 4 hours from: - acid reflux medications - calcium - iron - multivitamins   Please return in 2 months.

## 2020-12-11 NOTE — Addendum Note (Signed)
Addended by: Shelly Bombard on: 12/11/2020 11:07 AM   Modules accepted: Orders

## 2020-12-11 NOTE — Progress Notes (Signed)
Patient ID: Willie Spencer, male   DOB: [birth year], 55 y.o.   MRN: [id number]   This visit occurred during the SARS-CoV-2 public health emergency.  Safety protocols were in place, including screening questions prior to the visit, additional usage of staff PPE, and extensive cleaning of exam room while observing appropriate contact time as indicated for disinfecting solutions.   HPI: Willie Spencer is a 55 y.o.-year-old male, returning for f/u for DM1, dx 1987, uncontrolled, without complications and post ablative hypothyroidism. He saw Dr. Gabriel Carina in the past, last visit 2015.  Last visit with me 4 months ago.  Interim history: Since last visit, we switched from a Dexcom G6 CGM to the freestyle libre to due to allergic rash to the sensor adhesive. He does not describe blurry vision, increased urination, unexplained weight loss, nausea.  Insulin pump: -OmniPod-started 09/16/2016 -She switched to Omni pod Dash, but this was not covered by his insurance so he is back to his regular OmniPod pump (not Dash) -He would be interested to switch to Medtronic 670 G  CGM: -Dexcom G6 but rash -allergy to the adhesive despite using barrier tape >> changed to [employer] 2 (out of pocket as insurance does not cover it)  Insulin: -FiAsp >> Lyumjev  Supplies: [facility]  DM1: Reviewed HbA1c levels: Lab Results  Component Value Date   HGBA1C 7.2 (A) 08/09/2020   HGBA1C 7.3 (A) 04/06/2020   HGBA1C 7.1 (A) 12/06/2019   HGBA1C 7.0 (A) 08/01/2019   HGBA1C 7.6 (A) 03/29/2019   HGBA1C 7.4 (A) 12/02/2018   HGBA1C 7.3 (A) 08/19/2018   HGBA1C 7.0 01/20/2018   HGBA1C 7.7 10/22/2017   HGBA1C 7.4 03/10/2017   HGBA1C 7.2 10/27/2016   HGBA1C 7.6% 07/30/2016   HGBA1C 7.4% 01/29/2016   HGBA1C 8.1% 10/24/2015   HGBA1C 6.3% 07/09/2015   He was on a regimen of: - Lantus 40 units at dinnertime - Humalog 10-15 units 3x a day   Pump settings: Negative - basal rates: 12 am: 1.6  3 am:  1.6  5 am: 1.7 9 am:  1.3 1 pm: 1.4 - ICR: 4 - target: 100-100 - ISF: 35 - Insulin on Board: 4 hours Try to enter at least 20g carbs for coffee if you add the creamer. Try to use a temporary basal rate of 80% when exercising. TDD from basal insulin:  45-60% >> 35-76% TDD from bolus insulin: 31-61% >> 34-50% Total daily dose 60.7 units >> up to 80 units - extended bolusing: Not using - changes infusion site: Every 2.5 to 3 days.  He checks his sugars more than 4 times a day with his freestyle libre 2 CGM.  CGM parameters: - Average from CGM: 140 +/- 51 >> 165 >> 161+/-61 >> 156 - Coefficient of variation: 30% >> 42.8% - Sensor wear: 93% >> 96% of the time - GMI: 6.7% >> n/c >> 7.0%    Previously:   Lowest sugar was 30s (wife called EMS - ~2013) >>... 65 >> 46 (more active) >> 50s; he has hypoglycemia awareness in the 70s.  No previous hypoglycemia or DKA admissions.  He has a glucagon kit at home. Highest sugar was 426 >> <300 >> 303 >> >400 (cake) >> almost 400.  Pt's meals are: - Breakfast: sausage biscuits, orange crackers - Lunch: sandwich; fast food - Dinner: meat + veggies + starch - Snacks: at bedtime; 3: fruit, PB crackers, nuts  -+ Mild CKD, last BUN/creatinine:  Lab Results  Component Value Date  BUN 14 08/09/2020   BUN 13 08/01/2019   CREATININE 0.94 08/09/2020   CREATININE 1.02 08/01/2019  On losartan.  -+ HL; latest set of lipids: Lab Results  Component Value Date   CHOL 132 08/09/2020   HDL 43.40 08/09/2020   LDLCALC 58 08/09/2020   LDLDIRECT 106.0 10/22/2017   TRIG 153.0 (H) 08/09/2020   CHOLHDL 3 08/09/2020  On Lipitor 20 - ran out this am.  - last eye exam was in 2018: No DR >> coming up in 01/2021.  - no numbness and tingling in his feet.  Post ablative hypothyroidism: -History of Graves' disease, status post RAI treatment 07/2016  Pt is on levothyroxine 225 mcg daily (dose increased at last visit), but due to lack of 200 mcg tablets (he was not given these  at the pharmacy), he is on 175 mcg daily for the last 3 weeks!!! - in am - fasting - at least 30 min from b'fast - no calcium - no iron - + multivitamins in p.m. - no PPIs - not on Biotin  Reviewed his TFTs: Lab Results  Component Value Date   TSH 9.79 (H) 08/09/2020   TSH 8.46 (H) 04/06/2020   TSH 6.27 (H) 08/01/2019   TSH 2.14 12/02/2018   TSH 5.26 (H) 08/19/2018   TSH 11.52 (H) 01/20/2018   TSH 18.25 (H) 10/22/2017   TSH 7.52 (H) 03/10/2017   TSH 25.34 (H) 10/27/2016   TSH 8.46 (H) 07/30/2016   FREET4 1.04 08/09/2020   FREET4 1.07 04/06/2020   FREET4 1.14 08/01/2019   FREET4 1.25 12/02/2018   FREET4 1.11 08/19/2018   FREET4 0.97 01/20/2018   FREET4 0.90 10/22/2017   FREET4 0.97 03/10/2017   FREET4 0.42 (L) 10/27/2016   FREET4 1.2 07/30/2016   Pt denies: - feeling nodules in neck - hoarseness - dysphagia - choking - SOB with lying down  ROS: Constitutional: no weight gain/no weight loss, no fatigue, no subjective hyperthermia, no subjective hypothermia Eyes: no blurry vision, no xerophthalmia ENT: no sore throat, + see HPI Cardiovascular: no CP/no SOB/no palpitations/no leg swelling Respiratory: no cough/no SOB/no wheezing Gastrointestinal: no N/no V/no D/no C/no acid reflux Musculoskeletal: no muscle aches/no joint aches Skin: no rashes, no hair loss Neurological: no tremors/no numbness/no tingling/no dizziness  I reviewed pt's medications, allergies, PMH, social hx, family hx, and changes were documented in the history of present illness. Otherwise, unchanged from my initial visit note.  Past Medical History:  Diagnosis Date  . Diabetes mellitus without complication ([place])   . Hypertension   . Thyroid disease    No past surgical history on file. Social History   Social History  . Marital status: Married    Spouse name: N/A  . Number of children: 2   Occupational History  .  Maintenance supervisor   Social History Main Topics  . Smoking  status: Current Every Day Smoker    Packs/day: 1.00    Types: Cigarettes    Last attempt to quit: 08/30/2015  . Smokeless tobacco: Never Used     Comment: uses ecigs  . Alcohol use      Comment: 6 beers per weekend  . Drug use: No   Current Outpatient Medications on File Prior to Visit  Medication Sig Dispense Refill  . atorvastatin (LIPITOR) 20 MG tablet TAKE 1 TABLET BY MOUTH EVERY DAY 90 tablet 3  . Continuous Blood Gluc Receiver (FREESTYLE LIBRE 2 READER) DEVI 1 each by Does not apply route daily. 1 each 0  .  Continuous Blood Gluc Sensor (FREESTYLE LIBRE 2 SENSOR) MISC 1 each by Does not apply route every 14 (fourteen) days. 6 each 3  . FREESTYLE TEST STRIPS test strip Use as instructed to test blood sugar up to 4 times daily. 100 each 5  . glucagon (GLUCAGEN) 1 MG SOLR injection Inject 1 mg into the muscle once as needed for up to 1 dose for low blood sugar. 1 each 11  . Glucagon 3 MG/DOSE POWD Place 3 mg into the nose once as needed for up to 1 dose. 1 each 11  . Insulin Disposable Pump (OMNIPOD 5 PACK) MISC Use as instructed change pod every 1.5 days 27 each 2  . Insulin Lispro-aabc (LYUMJEV) 100 UNIT/ML SOLN Inject up to 80 units into the skin daily via pump. 9 mL 2  . Insulin Pen Needle 32G X 6 MM MISC To use with insulin pen. 100 each 4  . levothyroxine (SYNTHROID) 200 MCG tablet TAKE 1 TABLET (200 MCG TOTAL) BY MOUTH DAILY BEFORE BREAKFAST. 90 tablet 3  . levothyroxine (SYNTHROID) 25 MCG tablet Take 1 tablet (25 mcg total) by mouth daily before breakfast. Along with the 200 mcg tablet. 90 tablet 3  . losartan (COZAAR) 25 MG tablet TAKE 1 TABLET BY MOUTH DAILY 30 tablet 1  . NOVOLOG FLEXPEN 100 UNIT/ML FlexPen USE TO INJECT 10-15 UNITS 3 TIMES A DAY. 15 pen 1   No current facility-administered medications on file prior to visit.   Allergies  Allergen Reactions  . Wellbutrin [Bupropion]     Gum swelling  . Levemir [Insulin Detemir] Rash   Family History  Problem Relation  Age of Onset  . Diabetes Paternal Grandfather   . Stroke Paternal Grandfather   . Heart disease Paternal Grandfather    PE: BP 138/64   Pulse 78   Ht _0  (1.905 m)   Wt 240 lb (108.9 kg)   SpO2 99%   BMI 30.00 kg/m  Wt Readings from Last 3 Encounters:  12/11/20 240 lb (108.9 kg)  08/09/20 244 lb 3.2 oz (110.8 kg)  04/06/20 231 lb 8 oz (105 kg)   Constitutional: overweight, in NAD Eyes: PERRLA, EOMI, no exophthalmos ENT: moist mucous membranes, no thyromegaly, no cervical lymphadenopathy Cardiovascular: RRR, No MRG Respiratory: CTA B Gastrointestinal: abdomen soft, NT, ND, BS+ Musculoskeletal: no deformities, strength intact in all 4 Skin: moist, warm, no rashes Neurological: no tremor with outstretched hands, DTR normal in all 4  ASSESSMENT: 1. DM1, uncontrolled, without Long term complications, but with hyperglycemia  2. Post-ablative hypothyroidism  3.  Hyperlipidemia  PLAN:  1. Patient with longstanding, uncontrolled, type 1 diabetes, on insulin pump (OmniPod) and now back on the CGM (freestyle libre 2, changed from the [place] since last visit due to allergy to the adhesive).  At last visit, sugars were still slightly higher after meals, especially after lunch and dinner due to not introducing enough carbs into the pump, but he was working on improving this.  He refused a referral to nutrition for carb counting refresher in the past.  Overall, the sugars were higher than target during the night and they were improving in the morning.  Therefore, I advised him to increase his basal rates from 12 AM to 5 AM.  At last visit HbA1c was better, at 7.2%. CGM interpretation: -At today's visit, we reviewed her CGM downloads: It appears that 71% of values are in target range (goal >70%), while 27% are higher than 180 (goal <25%), and 2%  are lower than 70 (goal <4%).  The calculated average blood sugar is 156.  The projected HbA1c for the next 3 months (GMI) is 7.0%.  However,  there is a lot of variability (42.8% coefficient of variability-target less than 36%). -Reviewing the CGM trends, it appears that his sugars are low during the day, many in the 60s or even 50s and they are increasing around dinnertime and staying high throughout the night.  They decreased after 6 AM.  Upon questioning, he entered into the pump to suggested basal rate increases at last visit.  We will increase this again, from 12 AM to 5 AM.  However, it appears that his sugars are increasing after dinner since he is not entering enough carbs into the pump.  This is an ongoing problem and we discussed again about the importance of entering all the carbs at the beginning of the meal.  Until he starts doing so, would not be able to control the blood sugars at night.  Since he is dropping his sugars during the day, I will advise him to relax his ICR with breakfast and lunch but we will keep the same ICR with dinner.  Also, since his sugars are dropping between 5 AM and 9 AM, will decrease his basal rate around this time, also. -I recommended to: Patient Instructions  Please change: - basal rates: 12 am: 1.6 >> 1.8 3 am:  1.6 >> 1.7 5 am: 1.7 >> 1.5 9 am: 1.3 1 pm: 1.4 - ICR:  12 am: 4 >> 5 5 pm: 4 - target: 100-100 - ISF: 35 - Insulin on Board: 4 hours  Try to enter ALL carbs at the beginning of the meal. Try to enter at least 20g carbs for coffee if you add the creamer. Try to use a temporary basal rate of 80% when exercising.  Please increase levothyroxine to 225 mcg daily.  Take the thyroid hormone every day, with water, at least 30 minutes before breakfast, separated by at least 4 hours from: - acid reflux medications - calcium - iron - multivitamins   Please return in 2 months.  - we checked his HbA1c: 7.3% (slightly higher) - advised to check sugars at different times of the day - 4x a day, rotating check times - advised for yearly eye exams >> he is not UTD, but has an  appointment coming up - return to clinic in 2 months  2. Post-ablative hypothyroidism -Uncontrolled - latest thyroid labs reviewed with pt >> TSH still elevated: Lab Results  Component Value Date   TSH 9.79 (H) 08/09/2020   -We increased his LT4 dose to 225 mcg daily at last visit, but he tells me that for the last 3 weeks she has been taking only 175 mcg daily, which she had at home, and she was not given 200 mg tablets at the pharmacy - pt feels good on this dose. - we discussed about taking the thyroid hormone every day, with water, >30 minutes before breakfast, separated by >4 hours from acid reflux medications, calcium, iron, multivitamins. Pt. is taking it correctly. - will check thyroid tests at next visit, in 2 months: TSH and fT4, after he starts taking the correct dose.  3. Hyperlipidemia  -Reviewed latest lipid panel from last visit: LDL much improved, at goal, triglycerides only slightly above target: Lab Results  Component Value Date   CHOL 132 08/09/2020   HDL 43.40 08/09/2020   LDLCALC 58 08/09/2020   LDLDIRECT 106.0 10/22/2017  TRIG 153.0 (H) 08/09/2020   CHOLHDL 3 08/09/2020  -Continues Lipitor 20 without side effects -He ran out of Lipitor this morning-I refilled it  Philemon Kingdom, MD PhD [facility] Endocrinology

## 2021-01-15 ENCOUNTER — Ambulatory Visit: Payer: BC Managed Care – PPO | Admitting: Physician Assistant

## 2021-02-08 ENCOUNTER — Encounter: Payer: Self-pay | Admitting: Internal Medicine

## 2021-02-11 ENCOUNTER — Telehealth: Payer: Self-pay | Admitting: Internal Medicine

## 2021-02-11 NOTE — Telephone Encounter (Signed)
MEDICATION:  levothyroxine (SYNTHROID) 200 MCG tablet AND  levothyroxine (SYNTHROID) 25 MCG tablet AND  atorvastatin (LIPITOR) 20 MG tablet AND Insulin Lispro-aabc (LYUMJEV) 100 UNIT/ML SOLN  PHARMACY:    [id number] [address], [address] Phone:  [phone]  Fax:  [phone]      HAS THE PATIENT CONTACTED THEIR PHARMACY? No   IS THIS A 90 DAY SUPPLY : Yes  IS PATIENT OUT OF MEDICATION: No  IF NOT; HOW MUCH IS LEFT: Approx. 1 Week for Lyumjev-Approx. 2 Weeks for the other medications  LAST APPOINTMENT DATE: @3 /15/2022  NEXT APPOINTMENT DATE:@7 /07/2021  DO WE HAVE YOUR PERMISSION TO LEAVE A DETAILED MESSAGE?: Yes  OTHER COMMENTS:    **Let patient know to contact pharmacy at the end of the day to make sure medication is ready. **  ** Please notify patient to allow 48-72 hours to process**  **Encourage patient to contact the pharmacy for refills or they can request refills through [facility]**

## 2021-02-12 ENCOUNTER — Ambulatory Visit: Payer: Managed Care, Other (non HMO) | Admitting: Internal Medicine

## 2021-02-14 ENCOUNTER — Other Ambulatory Visit: Payer: Self-pay | Admitting: *Deleted

## 2021-02-14 DIAGNOSIS — E1065 Type 1 diabetes mellitus with hyperglycemia: Secondary | ICD-10-CM

## 2021-02-14 MED ORDER — LEVOTHYROXINE SODIUM 200 MCG PO TABS: 200.0000 ug | ORAL_TABLET | Freq: Every day | ORAL | 3 refills | Status: DC

## 2021-02-14 MED ORDER — ATORVASTATIN CALCIUM 20 MG PO TABS: 20.0000 mg | ORAL_TABLET | Freq: Every day | ORAL | 3 refills | Status: DC

## 2021-02-14 MED ORDER — LEVOTHYROXINE SODIUM 25 MCG PO TABS: 25.0000 ug | ORAL_TABLET | Freq: Every day | ORAL | 3 refills | Status: DC

## 2021-02-14 MED ORDER — LYUMJEV 100 UNIT/ML IJ SOLN: INTRAMUSCULAR | 3 refills | Status: DC

## 2021-02-14 NOTE — Telephone Encounter (Signed)
Rx's have been sent. 

## 2021-04-08 ENCOUNTER — Ambulatory Visit: Payer: Managed Care, Other (non HMO) | Admitting: Internal Medicine

## 2021-04-23 ENCOUNTER — Inpatient Hospital Stay (HOSPITAL_COMMUNITY): Payer: Managed Care, Other (non HMO)

## 2021-04-23 ENCOUNTER — Other Ambulatory Visit: Payer: Self-pay

## 2021-04-23 ENCOUNTER — Encounter (HOSPITAL_COMMUNITY): Payer: Self-pay

## 2021-04-23 ENCOUNTER — Emergency Department (HOSPITAL_COMMUNITY): Payer: Managed Care, Other (non HMO)

## 2021-04-23 ENCOUNTER — Inpatient Hospital Stay (HOSPITAL_COMMUNITY)
Admission: EM | Admit: 2021-04-23 | Discharge: 2021-04-28 | DRG: 200 | Disposition: A | Discharge status: Home / Self Care | Payer: Managed Care, Other (non HMO) | Attending: General Surgery | Admitting: General Surgery

## 2021-04-23 DIAGNOSIS — E785 Hyperlipidemia, unspecified: Secondary | ICD-10-CM | POA: Diagnosis present

## 2021-04-23 DIAGNOSIS — Y9352 Activity, horseback riding: Secondary | ICD-10-CM | POA: Diagnosis not present

## 2021-04-23 DIAGNOSIS — Z8249 Family history of ischemic heart disease and other diseases of the circulatory system: Secondary | ICD-10-CM | POA: Diagnosis not present

## 2021-04-23 DIAGNOSIS — S272XXA Traumatic hemopneumothorax, initial encounter: Secondary | ICD-10-CM | POA: Diagnosis present

## 2021-04-23 DIAGNOSIS — J939 Pneumothorax, unspecified: Secondary | ICD-10-CM

## 2021-04-23 DIAGNOSIS — F17210 Nicotine dependence, cigarettes, uncomplicated: Secondary | ICD-10-CM | POA: Diagnosis present

## 2021-04-23 DIAGNOSIS — Z20822 Contact with and (suspected) exposure to covid-19: Secondary | ICD-10-CM | POA: Diagnosis present

## 2021-04-23 DIAGNOSIS — Z79890 Hormone replacement therapy (postmenopausal): Secondary | ICD-10-CM | POA: Diagnosis not present

## 2021-04-23 DIAGNOSIS — Z794 Long term (current) use of insulin: Secondary | ICD-10-CM

## 2021-04-23 DIAGNOSIS — T1490XA Injury, unspecified, initial encounter: Secondary | ICD-10-CM

## 2021-04-23 DIAGNOSIS — I10 Essential (primary) hypertension: Secondary | ICD-10-CM | POA: Diagnosis present

## 2021-04-23 DIAGNOSIS — J9690 Respiratory failure, unspecified, unspecified whether with hypoxia or hypercapnia: Secondary | ICD-10-CM

## 2021-04-23 DIAGNOSIS — Z833 Family history of diabetes mellitus: Secondary | ICD-10-CM | POA: Diagnosis not present

## 2021-04-23 DIAGNOSIS — R55 Syncope and collapse: Secondary | ICD-10-CM | POA: Diagnosis present

## 2021-04-23 DIAGNOSIS — Z823 Family history of stroke: Secondary | ICD-10-CM | POA: Diagnosis not present

## 2021-04-23 DIAGNOSIS — J942 Hemothorax: Secondary | ICD-10-CM | POA: Diagnosis present

## 2021-04-23 DIAGNOSIS — Z888 Allergy status to other drugs, medicaments and biological substances status: Secondary | ICD-10-CM | POA: Diagnosis not present

## 2021-04-23 DIAGNOSIS — D62 Acute posthemorrhagic anemia: Secondary | ICD-10-CM | POA: Diagnosis present

## 2021-04-23 DIAGNOSIS — S2241XA Multiple fractures of ribs, right side, initial encounter for closed fracture: Secondary | ICD-10-CM | POA: Diagnosis present

## 2021-04-23 DIAGNOSIS — E109 Type 1 diabetes mellitus without complications: Secondary | ICD-10-CM | POA: Diagnosis present

## 2021-04-23 DIAGNOSIS — Z9641 Presence of insulin pump (external) (internal): Secondary | ICD-10-CM | POA: Diagnosis present

## 2021-04-23 DIAGNOSIS — E890 Postprocedural hypothyroidism: Secondary | ICD-10-CM | POA: Diagnosis present

## 2021-04-23 DIAGNOSIS — Z79899 Other long term (current) drug therapy: Secondary | ICD-10-CM

## 2021-04-23 DIAGNOSIS — Z4682 Encounter for fitting and adjustment of non-vascular catheter: Secondary | ICD-10-CM

## 2021-04-23 LAB — CBC WITH DIFFERENTIAL/PLATELET
Abs Immature Granulocytes: 0.15 10*3/uL — ABNORMAL HIGH (ref 0.00–0.07)
Basophils Absolute: 0.1 10*3/uL (ref 0.0–0.1)
Basophils Relative: 1 %
Eosinophils Absolute: 0.5 10*3/uL (ref 0.0–0.5)
Eosinophils Relative: 4 %
HCT: 33 % — ABNORMAL LOW (ref 39.0–52.0)
Hemoglobin: 11.2 g/dL — ABNORMAL LOW (ref 13.0–17.0)
Immature Granulocytes: 1 %
Lymphocytes Relative: 12 %
Lymphs Abs: 1.6 10*3/uL (ref 0.7–4.0)
MCH: 32.4 pg (ref 26.0–34.0)
MCHC: 33.9 g/dL (ref 30.0–36.0)
MCV: 95.4 fL (ref 80.0–100.0)
Monocytes Absolute: 1.1 10*3/uL — ABNORMAL HIGH (ref 0.1–1.0)
Monocytes Relative: 8 %
Neutro Abs: 10.1 10*3/uL — ABNORMAL HIGH (ref 1.7–7.7)
Neutrophils Relative %: 74 %
Platelets: 292 10*3/uL (ref 150–400)
RBC: 3.46 MIL/uL — ABNORMAL LOW (ref 4.22–5.81)
RDW: 11.8 % (ref 11.5–15.5)
WBC: 13.6 10*3/uL — ABNORMAL HIGH (ref 4.0–10.5)
nRBC: 0 % (ref 0.0–0.2)

## 2021-04-23 LAB — COMPREHENSIVE METABOLIC PANEL
ALT: 23 U/L (ref 0–44)
AST: 20 U/L (ref 15–41)
Albumin: 2.8 g/dL — ABNORMAL LOW (ref 3.5–5.0)
Alkaline Phosphatase: 66 U/L (ref 38–126)
Anion gap: 7 (ref 5–15)
BUN: 14 mg/dL (ref 6–20)
CO2: 24 mmol/L (ref 22–32)
Calcium: 7.8 mg/dL — ABNORMAL LOW (ref 8.9–10.3)
Chloride: 104 mmol/L (ref 98–111)
Creatinine, Ser: 1.12 mg/dL (ref 0.61–1.24)
GFR, Estimated: >60 mL/min (ref >60)
Glucose, Bld: 123 mg/dL — ABNORMAL HIGH (ref 70–99)
Potassium: 3.8 mmol/L (ref 3.5–5.1)
Sodium: 135 mmol/L (ref 135–145)
Total Bilirubin: 0.4 mg/dL (ref 0.3–1.2)
Total Protein: 5 g/dL — ABNORMAL LOW (ref 6.5–8.1)

## 2021-04-23 LAB — RESP PANEL BY RT-PCR (FLU A&B, COVID) ARPGX2
Influenza A by PCR: NEGATIVE
Influenza B by PCR: NEGATIVE
SARS Coronavirus 2 by RT PCR: NEGATIVE

## 2021-04-23 LAB — I-STAT CHEM 8, ED
BUN: 14 mg/dL (ref 6–20)
Calcium, Ion: 0.83 mmol/L — CL (ref 1.15–1.40)
Chloride: 101 mmol/L (ref 98–111)
Creatinine, Ser: 1.1 mg/dL (ref 0.61–1.24)
Glucose, Bld: 119 mg/dL — ABNORMAL HIGH (ref 70–99)
HCT: 31 % — ABNORMAL LOW (ref 39.0–52.0)
Hemoglobin: 10.5 g/dL — ABNORMAL LOW (ref 13.0–17.0)
Potassium: 3.8 mmol/L (ref 3.5–5.1)
Sodium: 138 mmol/L (ref 135–145)
TCO2: 26 mmol/L (ref 22–32)

## 2021-04-23 LAB — TROPONIN I (HIGH SENSITIVITY): Troponin I (High Sensitivity): 5 ng/L (ref <18)

## 2021-04-23 LAB — CBG MONITORING, ED
Glucose-Capillary: 117 mg/dL — ABNORMAL HIGH (ref 70–99)
Glucose-Capillary: 139 mg/dL — ABNORMAL HIGH (ref 70–99)
Glucose-Capillary: 228 mg/dL — ABNORMAL HIGH (ref 70–99)
Glucose-Capillary: 226 mg/dL — ABNORMAL HIGH (ref 70–99)

## 2021-04-23 LAB — LACTIC ACID, PLASMA
Lactic Acid, Venous: 2 mmol/L (ref 0.5–1.9)
Lactic Acid, Venous: 1.1 mmol/L (ref 0.5–1.9)

## 2021-04-23 LAB — TYPE AND SCREEN
ABO/RH(D): B POS
Antibody Screen: NEGATIVE

## 2021-04-23 LAB — ABO/RH: ABO/RH(D): B POS

## 2021-04-23 LAB — PROTIME-INR
INR: 1.3 — ABNORMAL HIGH (ref 0.8–1.2)
Prothrombin Time: 15.7 seconds — ABNORMAL HIGH (ref 11.4–15.2)

## 2021-04-23 MED ORDER — FENTANYL CITRATE (PF) 100 MCG/2ML IJ SOLN
INTRAMUSCULAR | Status: AC | PRN
  Administered 2021-04-23: 100 ug via INTRAVENOUS

## 2021-04-23 MED ORDER — ONDANSETRON HCL 4 MG/2ML IJ SOLN: 4.0000 mg | Freq: Four times a day (QID) | INTRAMUSCULAR | Status: DC | PRN

## 2021-04-23 MED ORDER — IOHEXOL 300 MG/ML  SOLN
100.0000 mL | Freq: Once | INTRAMUSCULAR | Status: AC | PRN
  Administered 2021-04-23: 100 mL via INTRAVENOUS

## 2021-04-23 MED ORDER — LACTATED RINGERS IV SOLN: INTRAVENOUS | Status: DC

## 2021-04-23 MED ORDER — DOCUSATE SODIUM 100 MG PO CAPS
100.0000 mg | ORAL_CAPSULE | Freq: Two times a day (BID) | ORAL | Status: DC
  Administered 2021-04-24 – 2021-04-28 (×9): 100 mg via ORAL
  Filled 2021-04-23 (×9): qty 1

## 2021-04-23 MED ORDER — ACETAMINOPHEN 500 MG PO TABS
1000.0000 mg | ORAL_TABLET | Freq: Four times a day (QID) | ORAL | Status: DC
  Administered 2021-04-23 – 2021-04-28 (×19): 1000 mg via ORAL
  Filled 2021-04-23 (×19): qty 2

## 2021-04-23 MED ORDER — ONDANSETRON 4 MG PO TBDP: 4.0000 mg | ORAL_TABLET | Freq: Four times a day (QID) | ORAL | Status: DC | PRN

## 2021-04-23 MED ORDER — FENTANYL CITRATE (PF) 100 MCG/2ML IJ SOLN
INTRAMUSCULAR | Status: AC
  Filled 2021-04-23: qty 2

## 2021-04-23 MED ORDER — ENOXAPARIN SODIUM 30 MG/0.3ML IJ SOSY
30.0000 mg | PREFILLED_SYRINGE | Freq: Two times a day (BID) | INTRAMUSCULAR | Status: DC
  Administered 2021-04-24 – 2021-04-28 (×9): 30 mg via SUBCUTANEOUS
  Filled 2021-04-23 (×9): qty 0.3

## 2021-04-23 MED ORDER — INSULIN PUMP
SUBCUTANEOUS | Status: DC
  Filled 2021-04-23: qty 1

## 2021-04-23 MED ORDER — MORPHINE SULFATE (PF) 4 MG/ML IV SOLN: 4.0000 mg | INTRAVENOUS | Status: DC | PRN

## 2021-04-23 MED ORDER — SODIUM CHLORIDE 0.9 % IV SOLN
INTRAVENOUS | Status: DC
Start: 2021-04-23 — End: 2021-04-23

## 2021-04-23 MED ORDER — FENTANYL CITRATE (PF) 100 MCG/2ML IJ SOLN
100.0000 ug | Freq: Once | INTRAMUSCULAR | Status: AC | PRN
  Administered 2021-04-23: 100 ug via INTRAVENOUS
  Filled 2021-04-23: qty 2

## 2021-04-23 MED ORDER — LIDOCAINE HCL (PF) 1 % IJ SOLN
30.0000 mL | Freq: Once | INTRAMUSCULAR | Status: AC
  Administered 2021-04-23: 30 mL via INTRADERMAL
  Filled 2021-04-23: qty 30

## 2021-04-23 MED ORDER — OXYCODONE HCL 5 MG PO TABS
5.0000 mg | ORAL_TABLET | ORAL | Status: DC | PRN
  Administered 2021-04-23: 5 mg via ORAL
  Administered 2021-04-23: 10 mg via ORAL
  Administered 2021-04-24 – 2021-04-25 (×5): 5 mg via ORAL
  Administered 2021-04-25: 10 mg via ORAL
  Administered 2021-04-25 – 2021-04-27 (×6): 5 mg via ORAL
  Administered 2021-04-27: 10 mg via ORAL
  Administered 2021-04-28 (×2): 5 mg via ORAL
  Administered 2021-04-28: 10 mg via ORAL
  Filled 2021-04-23 (×2): qty 1
  Filled 2021-04-23: qty 2
  Filled 2021-04-23 (×2): qty 1
  Filled 2021-04-23: qty 2
  Filled 2021-04-23 (×4): qty 1
  Filled 2021-04-23: qty 2
  Filled 2021-04-23 (×5): qty 1
  Filled 2021-04-23: qty 2
  Filled 2021-04-23: qty 1

## 2021-04-23 NOTE — ED Notes (Signed)
Pt self administered 7.8 units insulin per personal pump

## 2021-04-23 NOTE — Progress Notes (Signed)
..  Trauma Response Nurse Note-  Reason for Call  -  Pt fell off a horse on Sunday 7/17. Has a traumatic Hemopneumothorax. Was notified by charge RN and primary RN of pt.   Initial Focused Assessment (If applicable, or please see trauma documentation):  I met the patient in CT scan, assisted to and from CT table. Pt is awake, Pale, Has bilateral AC IV 18g with NS running. On the monitor.  Interventions:  Assisted Dr. Bobbye Morton and Jana Half, [place] with right chest tube insertion. Pt tolerated procedure well. Drained 2400cc of blood immediately. Changed 1st [place] out, [place] attached to suction.    Plan of Care as of this note:  Pt will be admitted to trauma service.   Rolene Arbour, RN  Trauma Response Nurse [phone]
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
## 2021-04-23 NOTE — ED Notes (Signed)
Following xray patient became pale and diaphoretic and BP back in 80s. Remains alert and oriented, denies any pain. Spouse and Dr Renaye Rakers at bedside for further assessment

## 2021-04-23 NOTE — ED Notes (Signed)
Patient given self 9.2 units of insulin for coverage and meal.
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
## 2021-04-23 NOTE — Procedures (Signed)
   Procedure Note  Date: 04/23/2021  Procedure: tube thoracostomy--right    Pre-op diagnosis: right pneumohemothorax  Post-op diagnosis: same  Surgeon: Diamantina Monks, MD  Anesthesia: local   EBL: <5cc procedural; 2.1L blood evacuated Drains/Implants: 89F chest tube Specimen: none  Description of procedure: Time-out was performed verifying correct patient, procedure, site, laterality, and signature of informed consent. Thirty cc's of local anesthetic was infiltrated into the tissues just over the fourth intercostal space.  A longitudinal incision was made parallel to the rib at the fourth intercostal space. This incision was deepened down through the muscle until the pleural cavity was entered. An audible air rush and blood were encountered upon entry and a 89F chest tube was inserted through this tract.   The tube was secured at 18cm at the skin with suture and connected to an atrium at -20cm water wall suction. Immediate output from the chest tube was 2.1L and was bloody. The site was dressed with xeroform, gauze, and tape. The patient tolerated the procedure well. There were no complications. Follow up chest x-ray was ordered to confirm tube positioning, complete evacuation, and complete lung re-expansion.    Diamantina Monks, MD General and Trauma Surgery [facility]

## 2021-04-24 ENCOUNTER — Inpatient Hospital Stay (HOSPITAL_COMMUNITY): Payer: Managed Care, Other (non HMO)

## 2021-04-24 LAB — BASIC METABOLIC PANEL
Anion gap: 4 — ABNORMAL LOW (ref 5–15)
BUN: 10 mg/dL (ref 6–20)
CO2: 27 mmol/L (ref 22–32)
Calcium: 7.8 mg/dL — ABNORMAL LOW (ref 8.9–10.3)
Chloride: 103 mmol/L (ref 98–111)
Creatinine, Ser: 0.88 mg/dL (ref 0.61–1.24)
GFR, Estimated: >60 mL/min (ref >60)
Glucose, Bld: 123 mg/dL — ABNORMAL HIGH (ref 70–99)
Potassium: 3.9 mmol/L (ref 3.5–5.1)
Sodium: 134 mmol/L — ABNORMAL LOW (ref 135–145)

## 2021-04-24 LAB — CBC
HCT: 26.6 % — ABNORMAL LOW (ref 39.0–52.0)
Hemoglobin: 9 g/dL — ABNORMAL LOW (ref 13.0–17.0)
MCH: 31.7 pg (ref 26.0–34.0)
MCHC: 33.8 g/dL (ref 30.0–36.0)
MCV: 93.7 fL (ref 80.0–100.0)
Platelets: 256 10*3/uL (ref 150–400)
RBC: 2.84 MIL/uL — ABNORMAL LOW (ref 4.22–5.81)
RDW: 11.8 % (ref 11.5–15.5)
WBC: 9.2 10*3/uL (ref 4.0–10.5)
nRBC: 0 % (ref 0.0–0.2)

## 2021-04-24 LAB — GLUCOSE, CAPILLARY
Glucose-Capillary: 121 mg/dL — ABNORMAL HIGH (ref 70–99)
Glucose-Capillary: 149 mg/dL — ABNORMAL HIGH (ref 70–99)
Glucose-Capillary: 167 mg/dL — ABNORMAL HIGH (ref 70–99)
Glucose-Capillary: 176 mg/dL — ABNORMAL HIGH (ref 70–99)
Glucose-Capillary: 86 mg/dL (ref 70–99)

## 2021-04-24 LAB — HIV ANTIBODY (ROUTINE TESTING W REFLEX): HIV Screen 4th Generation wRfx: NONREACTIVE

## 2021-04-24 MED ORDER — HYDRALAZINE HCL 20 MG/ML IJ SOLN
10.0000 mg | Freq: Three times a day (TID) | INTRAMUSCULAR | Status: DC | PRN
  Administered 2021-04-26: 10 mg via INTRAVENOUS
  Filled 2021-04-24: qty 1

## 2021-04-24 MED ORDER — INSULIN PUMP
SUBCUTANEOUS | Status: DC
  Administered 2021-04-24: 0.75 via SUBCUTANEOUS
  Administered 2021-04-24: 1.8 via SUBCUTANEOUS
  Administered 2021-04-26: 6 via SUBCUTANEOUS
  Administered 2021-04-26: 8.25 via SUBCUTANEOUS
  Administered 2021-04-26: 4.65 via SUBCUTANEOUS
  Administered 2021-04-27: 12 via SUBCUTANEOUS
  Administered 2021-04-27: 3.45 via SUBCUTANEOUS
  Administered 2021-04-27: 15 via SUBCUTANEOUS
  Administered 2021-04-27: 6.15 via SUBCUTANEOUS
  Administered 2021-04-27: 1.25 via SUBCUTANEOUS
  Administered 2021-04-27: 12.5 via SUBCUTANEOUS
  Administered 2021-04-28: 2.05 via SUBCUTANEOUS
  Filled 2021-04-24: qty 1

## 2021-04-24 MED ORDER — LEVOTHYROXINE SODIUM 100 MCG PO TABS
200.0000 ug | ORAL_TABLET | Freq: Every day | ORAL | Status: DC
  Administered 2021-04-25 – 2021-04-27 (×3): 200 ug via ORAL
  Filled 2021-04-24 (×3): qty 2

## 2021-04-24 MED ORDER — METHOCARBAMOL 500 MG PO TABS
500.0000 mg | ORAL_TABLET | Freq: Four times a day (QID) | ORAL | Status: DC
  Administered 2021-04-24 – 2021-04-28 (×14): 500 mg via ORAL
  Filled 2021-04-24 (×14): qty 1

## 2021-04-24 MED ORDER — POLYETHYLENE GLYCOL 3350 17 G PO PACK: 17.0000 g | PACK | Freq: Every day | ORAL | Status: DC | PRN

## 2021-04-24 MED ORDER — MORPHINE SULFATE (PF) 2 MG/ML IV SOLN: 2.0000 mg | INTRAVENOUS | Status: DC | PRN

## 2021-04-24 NOTE — TOC CAGE-AID Note (Signed)
Transition of Care [facility]) - CAGE-AID Screening   Patient Details  Name: Willie Spencer MRN: [id number] Date of Birth: 06-09-1966  [facility]) CM/SW Contact:    Vihan Santagata C Tarpley-Carter, LCSWA Phone Number: 04/24/2021, 1:59 PM   Clinical Narrative: Pt participated in Cage-Aid.  Pt stated he does not use substance or ETOH use.  Pt was not offered resources, due to no substance or ETOH use.    Domitila Stetler Tarpley-Carter, MSW, LCSW-A Pronouns:  She/Her/Hers [facility] HealthTransitions of Care Clinical Social Worker Direct Number:  [phone] [email or web address]   CAGE-AID Screening:    Have You Ever Felt You Ought to Cut Down on Your Drinking or Drug Use?: No Have People Annoyed You By [employer] Your Drinking Or Drug Use?: No Have You Felt Bad Or Guilty About Your Drinking Or Drug Use?: No Have You Ever Had a Drink or Used Drugs First Thing In The Morning to Steady Your Nerves or to Get Rid of a Hangover?: No CAGE-AID Score: 0  Substance Abuse Education Offered: No

## 2021-04-24 NOTE — Progress Notes (Addendum)
Inpatient Diabetes Program Recommendations  AACE/ADA: New Consensus Statement on Inpatient Glycemic Control   Target Ranges:  Prepandial:   less than 140 mg/dL      Peak postprandial:   less than 180 mg/dL (1-2 hours)      Critically ill patients:  140 - 180 mg/dL   Results for MARINA, DESIRE (MRN [id number]) as of 04/24/2021 10:13  Ref. Range 04/23/2021 11:52 04/23/2021 15:40 04/23/2021 18:26 04/23/2021 20:07 04/24/2021 03:37 04/24/2021 08:36  Glucose-Capillary Latest Ref Range: 70 - 99 mg/dL 165 (H) 537 (H) 482 (H) 226 (H) 121 (H) 86   Results for WITTEN, CERTAIN (MRN [id number]) as of 04/24/2021 10:13  Ref. Range 12/11/2020 11:07  Hemoglobin A1C Latest Ref Range: 4.0 - 5.6 % 7.3 (A)   Review of Glycemic Control  Diabetes history: DM1 Outpatient Diabetes medications: OmniPod insulin pump with Novolog Current orders for Inpatient glycemic control: Insulin Pump Q4H   NOTE: Noted consult for diabetes coordinator. Chart reviewed. Per chart, patient has DM1, uses an insulin pump with Novolog insulin outpatient for DM control, and sees Dr. Elvera Lennox (Endocrinologist) for DM management. Per office visit note on 12/11/20 by Dr. Elvera Lennox, the following should be patient's insulin pump settings:   Basal rates: 12 am: 1.8 units/hr 3 am:  1.7 units/hr 5 am: 1.5 units/hr 9 am: 1.3 units/hr 1 pm: 1.4 units/hr Total basal insulin: 35.4 units/day ICR: 12 am: 5 grams (1 unit covers 5 grams of carbohydrates) 5 pm: 4 grams (1 unit covers 4 grams of carbohydrates) target: 100-100 mg/dl ISF: 35 mg/dl (1 unit drops glucose 35 mg/dl) Insulin on Board: 4 hours  Patient admitted 04/23/21 after being Thrown from horse 1 week ago with Near syncope and R rib fractures with HPTX. Patient is currently ordered insulin pump Q4H and patient is using his insulin pump for inpatient glycemic control. Glucose 86 mg/dl this morning at 9:20 am. Will plan to follow up with patient today to verify pump settings.  Addendum  04/24/21@14 :45-Spoke with patient at bedside. Patient states that he uses an OmniPod insulin pump for DM control along with a FreeStyle Libre. Patient states that his FreeStyle Josephine Igo was removed prior to CT scan but he still had his OmniPod insulin pump. Patient states he just took his OmniPod off (lying on bedside table) because it was out of insulin. Since it is a disposable pod, his son is bringing him all his supplies so he can put on a new OmniPod. Patient states that his son should be here within an hour with all his supplies. Encouraged patient to get it restarted as soon as possible so he glucose does not increase and if he were unable to get it restarted we would need to use SQ insulin injections while inpatient. Patient is certain he will get it restarted shortly. Verified insulin pump settings which are are  Basal rates: 12 am: 1.6 units/hr 5 am:  1.7 units/hr 9 am: 1.3 units/hr 1 pm: 1.4 units/hr Total basal insulin: 35.4 units/day Not able to pull up carb ratio or insulin sensitivity factor but patient states it should be as noted in Dr. Charlean Sanfilippo notes. Informed patient that our team will follow along while inpatient. Patient verbalized understanding of information and states that he has no questions at this time. Spoke with Efraim Kaufmann, RN to make her aware that he has removed his OmniPod pump and his son is bringing new pump supplies so he can apply a new OmniPod and get pump restarted. Asked that RN document  on Insulin Pump flowsheet once patient applies new OmniPod.  Thanks, Orlando Penner, RN, MSN, CDE Diabetes Coordinator Inpatient Diabetes Program [phone] (Team Pager from 8am to 5pm)

## 2021-04-24 NOTE — Evaluation (Signed)
Physical Therapy Evaluation Only Patient Details Name: Willie Spencer MRN: [id number] DOB: May 04, 1966 Today's Date: 04/24/2021   History of Present Illness  55 yo male presented 04/23/21 with visual changes, feeling like he was going to pass out. Hypoglycemic 89 pale diaphoretic. Per chart, pt reports being thrown from horse a week prior. Pt found to have R rib fxs 3-7 & 10 with R HPTX. S/p R chest tube placement 04/23/21. PMH: thyroid disease, DM1, HTN   Clinical Impression  Pt presents with condition above. PTA, he was independent, working, and living with his wife in a 1-level house with 5 STE with bil handrails. Currently, he appears to be functioning at his baseline, not needing any physical assistance for all functional mobility. In addition, he was able to demonstrate that he is not at risk for falls through tolerating various gait challenges, like ambulating forwards/backwards, laterally in braided pattern, and in tandem stance forwards/backwards without overt LOB. Pt's SpO2 remained >/= 96% on RA throughout the session. Coordinated with pt and his wife to continue mobilizing with nursing and getting up to chair for all meals while in hospital. All education completed and questions answered. PT will sign off.    Follow Up Recommendations No PT follow up    Equipment Recommendations  None recommended by PT    Recommendations for Other Services       Precautions / Restrictions Precautions Precautions: Other (comment) Precaution Comments: R chest tube to wall suction Restrictions Weight Bearing Restrictions: No      Mobility  Bed Mobility Overal bed mobility: Modified Independent             General bed mobility comments: Pt able to transition supine > sit R EOB with HOB elevated and use of rail without difficulty, needing management of his lines but otherwise independent.    Transfers Overall transfer level: Needs assistance Equipment used: None Transfers: Sit to/from  [employer] Sit to Stand: Supervision Stand pivot transfers: Supervision       General transfer comment: Pt able to transition to sit and stand step transfer to recliner from bed without difficulty or LOB, supervision for safety and to manage lines.  Ambulation/Gait Ambulation/Gait assistance: Supervision;Min guard Gait Distance (Feet): 100 Feet Assistive device: None Gait Pattern/deviations: WFL(Within Functional Limits) Gait velocity: WNL Gait velocity interpretation: >4.37 ft/sec, indicative of normal walking speed General Gait Details: Pt ambulating safely forwards, backwards, braiding side-to-side, and tandem forwards and backwards without over LOB, min guard-supervision for safety and line management. Limited in distance due to chest tube attached to wall suction with request by RN to keep it attached at this time.  Stairs            Wheelchair Mobility    Modified Rankin (Stroke Patients Only)       Balance Overall balance assessment: Independent                                           Pertinent Vitals/Pain Pain Assessment: Faces Faces Pain Scale: Hurts a little bit Pain Location: R chest with mobility Pain Descriptors / Indicators: Discomfort Pain Intervention(s): Limited activity within patient's tolerance;Monitored during session;Repositioned    Home Living Family/patient expects to be discharged to:: Private residence Living Arrangements: Spouse/significant other Available Help at Discharge: Family;Available 24 hours/day Type of Home: House Home Access: Stairs to enter Entrance Stairs-Rails: Doctor, general practice of  Steps: 5 Home Layout: One level Home Equipment: Shower seat - built in;Grab bars - toilet;Grab bars - tub/shower      Prior Function Level of Independence: Independent         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     Hand Dominance        Extremity/Trunk  Assessment   Upper Extremity Assessment Upper Extremity Assessment: Overall WFL for tasks assessed    Lower Extremity Assessment Lower Extremity Assessment: Overall WFL for tasks assessed (Denies numbness/tingling bil; bil MMT scores of 5 grossly; coordination intact bil)    Cervical / Trunk Assessment Cervical / Trunk Assessment: Normal  Communication   Communication: No difficulties  Cognition Arousal/Alertness: Awake/alert Behavior During Therapy: WFL for tasks assessed/performed Overall Cognitive Status: Within Functional Limits for tasks assessed                                 General Comments: A&Ox4. Follows all directions appropriately and appears to understand safety concerns and how to maintain his safety.      General Comments General comments (skin integrity, edema, etc.): SpO2 >/= 96% on RA throughout session; re-donned at 1 L via [place] at end to ensure remains in appropriate range due to having R chest tube    Exercises     Assessment/Plan    PT Assessment Patent does not need any further PT services  PT Problem List         PT Treatment Interventions      PT Goals (Current goals can be found in the Care Plan section)  Acute Rehab PT Goals Patient Stated Goal: to go home once better PT Goal Formulation: With patient/family Time For Goal Achievement: 04/25/21 Potential to Achieve Goals: Good    Frequency     Barriers to discharge        Co-evaluation               AM-PAC PT "6 Clicks" Mobility  Outcome Measure Help needed turning from your back to your side while in a flat bed without using bedrails?: None Help needed moving from lying on your back to sitting on the side of a flat bed without using bedrails?: None Help needed moving to and from a bed to a chair (including a wheelchair)?: A Little Help needed standing up from a chair using your arms (e.g., wheelchair or bedside chair)?: A Little Help needed to walk in hospital  room?: A Little Help needed climbing 3-5 steps with a railing? : A Little 6 Click Score: 20    End of Session Equipment Utilized During Treatment: Other (comment);Oxygen (R chest tube) Activity Tolerance: Patient tolerated treatment well Patient left: in chair;with call bell/phone within reach;with family/visitor present Nurse Communication: Mobility status;Other (comment) (sats; no need for chair alarm, not at risk for falls, pt verbalized understanding to call for nursing to get up due to lines/leads) PT Visit Diagnosis: Difficulty in walking, not elsewhere classified (R26.2)    Time: 1308-6578 PT Time Calculation (min) (ACUTE ONLY): 22 min   Charges:   PT Evaluation $PT Eval Low Complexity: 1 Low          Raymond Gurney, PT, DPT Acute Rehabilitation Services  Pager: [phone] Office: [phone]   Jewel Baize 04/24/2021, 11:15 AM

## 2021-04-24 NOTE — Progress Notes (Signed)
Patient Willie Spencer at 21:37; pt self administered .75 units of insulin

## 2021-04-24 NOTE — Evaluation (Signed)
Occupational Therapy Evaluation Patient Details Name: Willie Spencer MRN: [id number] DOB: [birth year] Today's Date: 04/24/2021    History of Present Illness 55 yo male presented 04/23/21 with visual changes, feeling like he was going to pass out. Hypoglycemic 89 pale diaphoretic. Per chart, pt reports being thrown from horse a week prior. Pt found to have R rib fxs 3-7 & 10 with R HPTX. S/p R chest tube placement 04/23/21. 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 Pt works with horses for a hobby. Pt currently, Pt appears to be back to his baseline.  Pt A/O, cognition and vision appear intact- reports that his head did not contact the ground. Pt tolerating standing, but stuck to wall suction for chest tube so minimal exertion allotted. ADL education for figure 4 techniques with AE and log roll for bed mobility provided to avoid pain. Pt and spouse agreeable to no further OT skilled services. OT signing off. Thank you for the referral.    Follow Up Recommendations  No OT follow up    Equipment Recommendations  None recommended by OT    Recommendations for Other Services       Precautions / Restrictions Precautions Precautions: Other (comment) Precaution Comments: R chest tube to wall suction Restrictions Weight Bearing Restrictions: No      Mobility Bed Mobility               General bed mobility comments: up in recliner pre and post session    Transfers Overall transfer level: Needs assistance Equipment used: None Transfers: Sit to/from [employer] Transfers Sit to Stand: Supervision Stand pivot transfers: Supervision       General transfer comment: No physical assist for balance; assist for lines    Balance Overall balance assessment: Independent                                         ADL either performed or assessed with clinical judgement   ADL  Overall ADL's : Modified independent                                       General ADL Comments: Pt appears to be back to his baseline. Pt tolerating standing, but stuck to wall suction for chest tube. OTR went over education for figure 4 techniques and log roll to avoid pain.     Vision Baseline Vision/History: No visual deficits Patient Visual Report: No change from baseline Vision Assessment?: No apparent visual deficits     Perception     Praxis      Pertinent Vitals/Pain Pain Assessment: Faces Faces Pain Scale: Hurts a little bit Pain Location: R chest with mobility Pain Descriptors / Indicators: Discomfort Pain Intervention(s): Monitored during session     Hand Dominance Right   Extremity/Trunk Assessment Upper Extremity Assessment Upper Extremity Assessment: Overall WFL for tasks assessed   Lower Extremity Assessment Lower Extremity Assessment: Overall WFL for tasks assessed   Cervical / Trunk Assessment Cervical / Trunk Assessment: Normal   Communication Communication Communication: No difficulties   Cognition Arousal/Alertness: Awake/alert Behavior During Therapy: WFL for tasks assessed/performed Overall Cognitive Status: Within Functional Limits for tasks assessed  General Comments: Pt A/O x4, recalls incident and pt able to read directions, read clock and speaking clearly to spouse.   General Comments  O2 >90% on RA with exertion    Exercises     Shoulder Instructions      Home Living Family/patient expects to be discharged to:: Private residence Living Arrangements: Spouse/significant other Available Help at Discharge: Family;Available 24 hours/day Type of Home: House Home Access: Stairs to enter [employer] of Steps: 5 Entrance Stairs-Rails: Right;Left Home Layout: One level     Bathroom Shower/Tub: Producer, television/film/video: Standard     Home Equipment:  Shower seat - built in;Grab bars - toilet;Grab bars - tub/shower          Prior Functioning/Environment Level of Independence: Independent        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        OT Problem List: Pain      OT Treatment/Interventions:      OT Goals(Current goals can be found in the care plan section) Acute Rehab OT Goals Patient Stated Goal: to go home once better OT Goal Formulation: All assessment and education complete, DC therapy Potential to Achieve Goals: Good  OT Frequency: Min 2X/week   Barriers to D/C:            Co-evaluation              AM-PAC OT "6 Clicks" Daily Activity     Outcome Measure Help from another person eating meals?: None Help from another person taking care of personal grooming?: None Help from another person toileting, which includes using toliet, bedpan, or urinal?: A Little Help from another person bathing (including washing, rinsing, drying)?: A Little Help from another person to put on and taking off regular upper body clothing?: None Help from another person to put on and taking off regular lower body clothing?: A Little 6 Click Score: 21   End of Session Nurse Communication: Mobility status  Activity Tolerance: Patient tolerated treatment well Patient left: in chair;with call bell/phone within reach;with family/visitor present  OT Visit Diagnosis: Unsteadiness on feet (R26.81)                Time: 1145-1200 OT Time Calculation (min): 15 min Charges:  OT General Charges $OT Visit: 1 Visit OT Evaluation $OT Eval Low Complexity: 1 Low  Flora Lipps, OTR/L Acute Rehabilitation Services Pager: [phone] Office: [phone]   Lonzo Cloud 04/24/2021, 4:43 PM

## 2021-04-24 NOTE — TOC Initial Note (Signed)
Transition of Care [facility]) - Initial/Assessment Note    Patient Details  Name: QUINTEL MCCALLA MRN: [id number] Date of Birth: December 15, 1965  Transition of Care [facility]) CM/SW Contact:    Glennon Mac, RN Phone Number: 04/24/2021, 3:39 PM  Clinical Narrative:    55 yo male presented 04/23/21 with visual changes, feeling like he was going to pass out. Hypoglycemic 89 pale diaphoretic. Per chart, pt reports being thrown from horse a week prior. Pt found to have R rib fxs 3-7 & 10 with R HPTX. S/p R chest tube placement 04/23/21. Prior to admission, patient independent and living at home with spouse, who can provide 24-hour assistance at home.  PT recommending no outpatient follow-up.  Patient denies any needs for home currently; will continue to follow.             Expected Discharge Plan: Home/Self Care Barriers to Discharge: Continued Medical Work up   Patient Goals and CMS Choice Patient states their goals for this hospitalization and ongoing recovery are:: to go home      Expected Discharge Plan and Services Expected Discharge Plan: Home/Self Care   Discharge Planning Services: CM Consult   Living arrangements for the past 2 months: Single Family Home                                      Prior Living Arrangements/Services Living arrangements for the past 2 months: Single Family Home Lives with:: Spouse Patient language and need for interpreter reviewed:: Yes Do you feel safe going back to the place where you live?: Yes      Need for Family Participation in Patient Care: Yes (Comment) Care giver support system in place?: Yes (comment)   Criminal Activity/Legal Involvement Pertinent to Current Situation/Hospitalization: No - Comment as needed  Activities of Daily Living Home Assistive Devices/Equipment: Insulin Pump ADL Screening (condition at time of admission) Patient's cognitive ability adequate to safely complete daily activities?: Yes Is the patient deaf or have  difficulty hearing?: No Does the patient have difficulty seeing, even when wearing glasses/contacts?: No Does the patient have difficulty concentrating, remembering, or making decisions?: No Patient able to express need for assistance with ADLs?: Yes Does the patient have difficulty dressing or bathing?: No Independently performs ADLs?: Yes (appropriate for developmental age) Does the patient have difficulty walking or climbing stairs?: No Weakness of Legs: None Weakness of Arms/Hands: None  Permission Sought/Granted                  Emotional Assessment Appearance:: Appears stated age Attitude/Demeanor/Rapport: Engaged Affect (typically observed): Accepting Orientation: : Oriented to Self, Oriented to Place, Oriented to  Time, Oriented to Situation      Admission diagnosis:  Respiratory failure (HCC) [J96.90] Trauma [T14.90XA] Hemothorax on right [J94.2] Patient Active Problem List   Diagnosis Date Noted   Hemothorax on right 04/23/2021   Hyperlipidemia 10/22/2017   HTN (hypertension) 01/29/2016   Obesity 10/24/2015   Chronic pain in right shoulder 07/26/2015   Type 1 diabetes mellitus with hyperglycemia (HCC) 07/09/2015   Postablative hypothyroidism 07/09/2015   Smoker 07/09/2015   PCP:  Avanell Shackleton, NP-C Pharmacy:   [facility] - [place], [place] - [phone], [address] [place] [address] Phone: [phone] Fax: [phone]  [id number] - [place], [place] - [address] [place] [address] Phone:  [phone] Fax: [phone]     Social Determinants of Health (SDOH) Interventions    Readmission Risk Interventions No flowsheet data found.  Quintella Baton, RN, BSN  Trauma/Neuro ICU Case Manager [phone]

## 2021-04-24 NOTE — Progress Notes (Signed)
Subjective: CC: R rib pain Patient is doing well. Pain along right ribs and where chest tube is located that is worse when he takes in a deep breath. No other areas of pain. No sob. Tolerating diet without n/v. Mobilizing with PT. Voiding. Up in the chair this AM.   ROS: See above, otherwise other systems negative   Objective: Vital signs in last 24 hours: Temp:  [97.8 F (36.6 C)-98.3 F (36.8 C)] 98.3 F (36.8 C) (07/27 0728) Pulse Rate:  [68-90] 72 (07/27 0728) Resp:  [12-24] 12 (07/27 0728) BP: (84-132)/(52-84) 111/68 (07/27 0728) SpO2:  [93 %-100 %] 99 % (07/27 0728) Weight:  [108.9 kg] 108.9 kg (07/26 1535)    Intake/Output from previous day: 07/26 0701 - 07/27 0700 In: 2330.6 [P.O.:120; I.V.:1340.6] Out: 2400  Intake/Output this shift: Total I/O In: -  Out: 500 [Urine:500]  Family History  Problem Relation Age of Onset   Diabetes Paternal Grandfather    Stroke Paternal Grandfather    Heart disease Paternal Grandfather     Past Medical History:  Diagnosis Date   Diabetes mellitus without complication (HCC)    Hypertension    Thyroid disease     History reviewed. No pertinent surgical history.  Social History:  reports that he has been smoking cigarettes. He has been smoking an average of 1 pack per day. He has never used smokeless tobacco. He reports current alcohol use. He reports that he does not use drugs.  Allergies:  Allergies  Allergen Reactions   Wellbutrin [Bupropion] Swelling and Other (See Comments)    Gum swelling   Levemir [Insulin Detemir] Rash    Medications Prior to Admission  Medication Sig Dispense Refill   atorvastatin (LIPITOR) 20 MG tablet Take 1 tablet (20 mg total) by mouth daily. 90 tablet 3   Insulin Disposable Pump (OMNIPOD 5 PACK) MISC Use as instructed change pod every 1.5 days (Patient taking differently: Inject 1 Dose into the skin See admin instructions. Use as instructed change pod every 3 days. 100 units for 3  days) 27 each 2   levothyroxine (SYNTHROID) 200 MCG tablet Take 1 tablet (200 mcg total) by mouth daily before breakfast. 90 tablet 3   NOVOLOG FLEXPEN 100 UNIT/ML FlexPen USE TO INJECT 10-15 UNITS 3 TIMES A DAY. (Patient taking differently: Inject 10 Units into the skin See admin instructions. Use to inject 10 units 3 times a day. Only if omnipod don't work) 15 pen 1   Continuous Blood Gluc Receiver (FREESTYLE LIBRE 2 READER) DEVI 1 each by Does not apply route daily. 1 each 0   Continuous Blood Gluc Sensor (FREESTYLE LIBRE 2 SENSOR) MISC 1 each by Does not apply route every 14 (fourteen) days. 6 each 3   FREESTYLE TEST STRIPS test strip Use as instructed to test blood sugar up to 4 times daily. 100 each 5   glucagon (GLUCAGEN) 1 MG SOLR injection Inject 1 mg into the muscle once as needed for up to 1 dose for low blood sugar. 1 each 11   Glucagon 3 MG/DOSE POWD Place 3 mg into the nose once as needed for up to 1 dose. (Patient taking differently: Place 3 mg into the nose once as needed (low sugar).) 1 each 11   Insulin Lispro-aabc (LYUMJEV) 100 UNIT/ML SOLN Inject up to 80 units into the skin daily via pump. (Patient not taking: No sig reported) 90 mL 3   Insulin Pen Needle 32G X 6 MM MISC  To use with insulin pen. 100 each 4   levothyroxine (SYNTHROID) 25 MCG tablet Take 1 tablet (25 mcg total) by mouth daily before breakfast. Along with the 200 mcg tablet. (Patient not taking: No sig reported) 90 tablet 3   losartan (COZAAR) 25 MG tablet TAKE 1 TABLET BY MOUTH DAILY (Patient not taking: No sig reported) 30 tablet 1    Physical Exam:  Blood pressure 111/68, pulse 72, temperature 98.3 F (36.8 C), resp. rate 12, height 6\' 3"  (1.905 m), weight 108.9 kg, SpO2 99 %. General: pleasant, WD,  male/male who is laying in bed in NAD HEENT: head is normocephalic, atraumatic.  Sclera are noninjected.  PERRL.  Ears and nose without any masses or lesions.  Mouth is pink and moist Heart: regular, rate, and  rhythm. Palpable pedal pulses bilaterally Lungs: CTA b/l,  Respiratory effort nonlabored with normal rate. R chest tube in place w/ c/d/I bandage. CT to -20. No air leak. Bloody/SS drainage in cannister, ~1L Abd: Soft, NT, ND, +BS Skin: warm and dry with no masses, lesions, or rashes Neuro: Cranial nerves 2-12 grossly intact Psych: A&Ox3 with an appropriate affect Msk: MAE's. All 4 extremities are symmetrical with no cyanosis, clubbing, or edema.   Lab Results:  Recent Labs    04/23/21 1150 04/23/21 1203 04/24/21 0351  WBC 13.6*  --  9.2  HGB 11.2* 10.5* 9.0*  HCT 33.0* 31.0* 26.6*  PLT 292  --  256   BMET Recent Labs    04/23/21 1150 04/23/21 1203 04/24/21 0351  NA 135 138 134*  K 3.8 3.8 3.9  CL 104 101 103  CO2 24  --  27  GLUCOSE 123* 119* 123*  BUN 14 14 10   CREATININE 1.12 1.10 0.88  CALCIUM 7.8*  --  7.8*   PT/INR Recent Labs    04/23/21 1250  LABPROT 15.7*  INR 1.3*   CMP     Component Value Date/Time   NA 134 (L) 04/24/2021 0351   K 3.9 04/24/2021 0351   CL 103 04/24/2021 0351   CO2 27 04/24/2021 0351   GLUCOSE 123 (H) 04/24/2021 0351   BUN 10 04/24/2021 0351   CREATININE 0.88 04/24/2021 0351   CREATININE 1.02 08/01/2019 1004   CALCIUM 7.8 (L) 04/24/2021 0351   PROT 5.0 (L) 04/23/2021 1150   ALBUMIN 2.8 (L) 04/23/2021 1150   AST 20 04/23/2021 1150   ALT 23 04/23/2021 1150   ALKPHOS 66 04/23/2021 1150   BILITOT 0.4 04/23/2021 1150   GFRNONAA >60 04/24/2021 0351   GFRNONAA 85 08/01/2019 1004   GFRAA 98 08/01/2019 1004   Lipase  No results found for: LIPASE  Studies/Results: DG Chest 1 View  Result Date: 04/23/2021 CLINICAL DATA:  Syncopal episode yesterday and fell. EXAM: CHEST  1 VIEW COMPARISON:  None. FINDINGS: The cardiac silhouette, mediastinal and hilar contours are within normal limits. The left lung is clear. Diffuse increased density over the right hemithorax likely due to layering pleural fluid or pleural hematoma. There is also a  15-20% right-sided pneumothorax. There are several right-sided rib fractures this involves the third, fourth, fifth, sixth and possibly the seventh right posterolateral ribs. IMPRESSION: Right-sided rib fractures. Right-sided hydro or hemo pneumothorax. Electronically Signed   By: 13/10/2018 M.D.   On: 04/23/2021 12:14   CT CHEST ABDOMEN PELVIS W CONTRAST  Result Date: 04/23/2021 CLINICAL DATA:  Abdominal trauma. Syncope. Fall from horse 1 week ago. EXAM: CT CHEST, ABDOMEN, AND PELVIS WITH CONTRAST TECHNIQUE: Multidetector  CT imaging of the chest, abdomen and pelvis was performed following the standard protocol during bolus administration of intravenous contrast. CONTRAST:  OMNIPAQUE IOHEXOL 300 MG/ML  SOLN COMPARISON:  Same day chest x-ray, abdominopelvic CT 08/22/2009 FINDINGS: CT CHEST FINDINGS Cardiovascular: Heart size is normal. No pericardial fluid/effusion. Thoracic aorta is nonaneurysmal. Main pulmonary trunk is nondilated. Mediastinum/Nodes: No mediastinal hematoma. No axillary, mediastinal, or hilar lymphadenopathy. Trachea midline. No abnormal shift of the heart or mediastinal structures. Thyroid, trachea, and esophagus within normal limits. Lungs/Pleura: Moderate-large right-sided hemopneumothorax with large fluid component. Air component approximately 25% volume. Mixed density pleural fluid compatible with blood products. Consolidation within the dependent portion of the lung may reflect a combination of atelectasis and pulmonary contusion. The left lung is clear without pleural effusion or pneumothorax. Musculoskeletal: Multiple acute right-sided rib fractures involving the right third through seventh ribs as well as the right tenth rib. The right sixth, seventh, and tenth rib fractures are moderately displaced. No left-sided rib fracture identified. Thoracic vertebral body heights and alignment are maintained. No chest wall hematoma or soft tissue emphysema. CT ABDOMEN PELVIS FINDINGS  Hepatobiliary: No hepatic injury or perihepatic hematoma. Gallbladder is unremarkable. Pancreas: Unremarkable. No pancreatic ductal dilatation or surrounding inflammatory changes. Spleen: No splenic injury or perisplenic hematoma. Adrenals/Urinary Tract: Unremarkable adrenal glands without evidence of adrenal hemorrhage. Kidneys enhance symmetrically without focal lesion, stone, or hydronephrosis. No evidence of renal injury or perinephric hematoma. The ureters are nondilated. Urinary bladder is partially decompressed, limiting its evaluation. Stomach/Bowel: Stomach is within normal limits. Appendix appears normal. No evidence of bowel wall thickening, distention, or inflammatory changes. Vascular/Lymphatic: Scattered aortoiliac atherosclerotic calcifications without aneurysm. Numerous mildly prominent retroperitoneal and mesenteric lymph nodes are similar in size, number, and distribution from prior CT of 2010. Reproductive: Prostate is unremarkable. Other: No free fluid or evidence of hemoperitoneum. No pneumoperitoneum. No abdominal wall hernia. Musculoskeletal: Lumbar vertebral body heights and alignment are maintained. Pelvic bony ring intact without fracture or diastasis. Hips intact without fracture or dislocation. Induration overlies the lateral aspect of the right abdominal wall. No abdominal or pelvic wall hematoma. IMPRESSION: 1. Moderate-large right-sided hemopneumothorax. No evidence of tension component. 2. Multiple acute right-sided rib fractures involving the right third through seventh ribs as well as the right tenth rib. The right sixth, seventh, and tenth rib fractures are moderately displaced. 3. No evidence of acute traumatic injury within the abdomen or pelvis. 4. Multiple prominent retroperitoneal and mesenteric lymph nodes are similar in size, number, and distribution from prior CT of 2010 and favored benign. Aortic Atherosclerosis (ICD10-I70.0). These results were called by telephone at the  time of interpretation on 04/23/2021 at 1:18 pm to provider Kris Mouton , who verbally acknowledged these results. Electronically Signed   By: Duanne Guess D.O.   On: 04/23/2021 13:21   DG Chest Port 1 View  Result Date: 04/24/2021 CLINICAL DATA:  Right-sided chest tube.  Hemopneumothorax. EXAM: PORTABLE CHEST 1 VIEW COMPARISON:  One-view chest x-ray 04/23/2021 FINDINGS: Heart size normal. Atherosclerotic calcifications noted in the aorta. Large-bore right-sided chest tube now in place. Tiny pneumothorax remains, near completely resolved. Multiple right-sided rib fractures again noted. Hazy opacity present over the right lung with markedly improved aeration. Minimal atelectasis is present at the right base. Subcutaneous emphysema present in the axilla. IMPRESSION: 1. Interval placement of right-sided chest tube with near complete resolution of right pneumothorax. 2. Multiple right-sided rib fractures. 3. Aeration of the right lung with some residual asymmetric edema or atelectasis. Electronically Signed  By: Marin Roberts M.D.   On: 04/24/2021 08:21   DG Chest Port 1 View  Result Date: 04/23/2021 CLINICAL DATA:  Respiratory failure. Status post right chest tube placement today. EXAM: PORTABLE CHEST 1 VIEW COMPARISON:  Single-view of the chest earlier today. FINDINGS: New right chest tube is in place. Right pneumothorax seen on the prior examination has almost completely resolved with only a small residual seen. Right pleural effusion and basilar atelectasis have also markedly improved. The left lung is expanded and clear. Heart size is normal. There is some subcutaneous emphysema in the right chest wall. Right rib fractures noted. IMPRESSION: New right chest tube in place. Right pneumothorax is almost completely resolved in the patient's right pleural effusion is markedly decreased. No new abnormality. Multiple right rib fractures. Electronically Signed   By: Drusilla Kanner M.D.   On:  04/23/2021 14:32    Anti-infectives: Anti-infectives (From admission, onward)    None        Assessment/Plan Thrown from horse 1 week ago Near syncope R rib fractures with HPTX - chest tube placed in ED 7/26 w/ prompt output of 2.1L sanguinous fluid. 1L in cannister this am. CXR this AM w/ near resolution of HPTX. Tiny PTX remains apically. No air leak. Keep to -20 this AM. Repeat CXR in AM. Pulm toilet, multimodal pain control, incentive spirometry T1DM - he has insulin pump (RUE). CBG q 4 hours. Appreciate DM coordinator for assistance w/ insulin pump therapy orders HTN - reports prior hx and not currently on any medication prior to admission. BP okay this AM (last BP 111/68). PRN meds  Thyroid disease - home meds  FEN: regular ID: none currently VTE: lovenox, SCDs Foley - None Dispo - Cleared by PT. OT to screen. CT management as above    LOS: 1 day    Jacinto Halim , [facility] 04/24/2021, 11:28 AM Please see Amion for pager number during day hours 7:00am-4:30pm

## 2021-04-24 NOTE — Progress Notes (Signed)
Patient arrived to room 4N07 from ED.  Assessment complete, VS obtained, and Admission database began.

## 2021-04-25 ENCOUNTER — Inpatient Hospital Stay (HOSPITAL_COMMUNITY): Payer: Managed Care, Other (non HMO)

## 2021-04-25 LAB — GLUCOSE, CAPILLARY
Glucose-Capillary: 123 mg/dL — ABNORMAL HIGH (ref 70–99)
Glucose-Capillary: 148 mg/dL — ABNORMAL HIGH (ref 70–99)
Glucose-Capillary: 149 mg/dL — ABNORMAL HIGH (ref 70–99)
Glucose-Capillary: 193 mg/dL — ABNORMAL HIGH (ref 70–99)
Glucose-Capillary: 235 mg/dL — ABNORMAL HIGH (ref 70–99)

## 2021-04-25 LAB — CBC
HCT: 25.6 % — ABNORMAL LOW (ref 39.0–52.0)
Hemoglobin: 8.7 g/dL — ABNORMAL LOW (ref 13.0–17.0)
MCH: 31.9 pg (ref 26.0–34.0)
MCHC: 34 g/dL (ref 30.0–36.0)
MCV: 93.8 fL (ref 80.0–100.0)
Platelets: 252 10*3/uL (ref 150–400)
RBC: 2.73 MIL/uL — ABNORMAL LOW (ref 4.22–5.81)
RDW: 12 % (ref 11.5–15.5)
WBC: 8.6 10*3/uL (ref 4.0–10.5)
nRBC: 0 % (ref 0.0–0.2)

## 2021-04-25 LAB — BASIC METABOLIC PANEL
Anion gap: 6 (ref 5–15)
BUN: 10 mg/dL (ref 6–20)
CO2: 27 mmol/L (ref 22–32)
Calcium: 7.9 mg/dL — ABNORMAL LOW (ref 8.9–10.3)
Chloride: 100 mmol/L (ref 98–111)
Creatinine, Ser: 0.79 mg/dL (ref 0.61–1.24)
GFR, Estimated: >60 mL/min (ref >60)
Glucose, Bld: 147 mg/dL — ABNORMAL HIGH (ref 70–99)
Potassium: 4 mmol/L (ref 3.5–5.1)
Sodium: 133 mmol/L — ABNORMAL LOW (ref 135–145)

## 2021-04-25 NOTE — Progress Notes (Addendum)
Subjective: CC: R rib pain Continued pain with inspiration. He has been trying to sit up and move around more. No respiratory, abdominal, voiding complaints   Objective: Vital signs in last 24 hours: Temp:  [98.1 F (36.7 C)-98.8 F (37.1 C)] 98.2 F (36.8 C) (07/28 0727) Pulse Rate:  [74-79] 74 (07/28 0727) Resp:  [15-19] 16 (07/28 0727) BP: (122-155)/(63-73) 155/73 (07/28 0727) SpO2:  [94 %-98 %] 94 % (07/28 0727)    Intake/Output from previous day: 07/27 0701 - 07/28 0700 In: 240 [P.O.:240] Out: 2250 [Urine:1450; Chest Tube:800] Intake/Output this shift: No intake/output data recorded.  Family History  Problem Relation Age of Onset   Diabetes Paternal Grandfather    Stroke Paternal Grandfather    Heart disease Paternal Grandfather     Past Medical History:  Diagnosis Date   Diabetes mellitus without complication (HCC)    Hypertension    Thyroid disease     History reviewed. No pertinent surgical history.  Social History:  reports that he has been smoking cigarettes. He has been smoking an average of 1 pack per day. He has never used smokeless tobacco. He reports current alcohol use. He reports that he does not use drugs.  Allergies:  Allergies  Allergen Reactions   Wellbutrin [Bupropion] Swelling and Other (See Comments)    Gum swelling   Levemir [Insulin Detemir] Rash    Medications Prior to Admission  Medication Sig Dispense Refill   atorvastatin (LIPITOR) 20 MG tablet Take 1 tablet (20 mg total) by mouth daily. 90 tablet 3   Insulin Disposable Pump (OMNIPOD 5 PACK) MISC Use as instructed change pod every 1.5 days (Patient taking differently: Inject 1 Dose into the skin See admin instructions. Use as instructed change pod every 3 days. 100 units for 3 days) 27 each 2   levothyroxine (SYNTHROID) 200 MCG tablet Take 1 tablet (200 mcg total) by mouth daily before breakfast. 90 tablet 3   NOVOLOG FLEXPEN 100 UNIT/ML FlexPen USE TO INJECT 10-15 UNITS 3  TIMES A DAY. (Patient taking differently: Inject 10 Units into the skin See admin instructions. Use to inject 10 units 3 times a day. Only if omnipod don't work) 15 pen 1   Continuous Blood Gluc Receiver (FREESTYLE LIBRE 2 READER) DEVI 1 each by Does not apply route daily. 1 each 0   Continuous Blood Gluc Sensor (FREESTYLE LIBRE 2 SENSOR) MISC 1 each by Does not apply route every 14 (fourteen) days. 6 each 3   FREESTYLE TEST STRIPS test strip Use as instructed to test blood sugar up to 4 times daily. 100 each 5   glucagon (GLUCAGEN) 1 MG SOLR injection Inject 1 mg into the muscle once as needed for up to 1 dose for low blood sugar. 1 each 11   Glucagon 3 MG/DOSE POWD Place 3 mg into the nose once as needed for up to 1 dose. (Patient taking differently: Place 3 mg into the nose once as needed (low sugar).) 1 each 11   Insulin Lispro-aabc (LYUMJEV) 100 UNIT/ML SOLN Inject up to 80 units into the skin daily via pump. (Patient not taking: No sig reported) 90 mL 3   Insulin Pen Needle 32G X 6 MM MISC To use with insulin pen. 100 each 4   levothyroxine (SYNTHROID) 25 MCG tablet Take 1 tablet (25 mcg total) by mouth daily before breakfast. Along with the 200 mcg tablet. (Patient not taking: No sig reported) 90 tablet 3   losartan (COZAAR)  25 MG tablet TAKE 1 TABLET BY MOUTH DAILY (Patient not taking: No sig reported) 30 tablet 1    Physical Exam:  Blood pressure (!) 155/73, pulse 74, temperature 98.2 F (36.8 C), temperature source Oral, resp. rate 16, height 6\' 3"  (1.905 m), weight 108.9 kg, SpO2 94 %. General: pleasant, WD,  male who is sitting up in bed in NAD HEENT: head is normocephalic, atraumatic. Mouth is pink and moist Heart: regular, rate, and rhythm. Palpable pedal pulses bilaterally Lungs: CTA b/l,  Respiratory effort nonlabored with normal rate. R chest tube in place w/ c/d/I bandage. CT to -20. No air leak. Bloody/SS drainage in cannister, full (approx 2L) Abd: Soft, NT, ND Skin: warm and  dry with no masses, lesions, or rashes Psych: A&Ox3 with an appropriate affect Msk: MAE's. All 4 extremities are symmetrical with no cyanosis, clubbing, or edema.   Lab Results:  Recent Labs    04/24/21 0351 04/25/21 0231  WBC 9.2 8.6  HGB 9.0* 8.7*  HCT 26.6* 25.6*  PLT 256 252    BMET Recent Labs    04/24/21 0351 04/25/21 0231  NA 134* 133*  K 3.9 4.0  CL 103 100  CO2 27 27  GLUCOSE 123* 147*  BUN 10 10  CREATININE 0.88 0.79  CALCIUM 7.8* 7.9*    PT/INR Recent Labs    04/23/21 1250  LABPROT 15.7*  INR 1.3*    CMP     Component Value Date/Time   NA 133 (L) 04/25/2021 0231   K 4.0 04/25/2021 0231   CL 100 04/25/2021 0231   CO2 27 04/25/2021 0231   GLUCOSE 147 (H) 04/25/2021 0231   BUN 10 04/25/2021 0231   CREATININE 0.79 04/25/2021 0231   CREATININE 1.02 08/01/2019 1004   CALCIUM 7.9 (L) 04/25/2021 0231   PROT 5.0 (L) 04/23/2021 1150   ALBUMIN 2.8 (L) 04/23/2021 1150   AST 20 04/23/2021 1150   ALT 23 04/23/2021 1150   ALKPHOS 66 04/23/2021 1150   BILITOT 0.4 04/23/2021 1150   GFRNONAA >60 04/25/2021 0231   GFRNONAA 85 08/01/2019 1004   GFRAA 98 08/01/2019 1004   Lipase  No results found for: LIPASE  Studies/Results: DG Chest 1 View  Result Date: 04/23/2021 CLINICAL DATA:  Syncopal episode yesterday and fell. EXAM: CHEST  1 VIEW COMPARISON:  None. FINDINGS: The cardiac silhouette, mediastinal and hilar contours are within normal limits. The left lung is clear. Diffuse increased density over the right hemithorax likely due to layering pleural fluid or pleural hematoma. There is also a 15-20% right-sided pneumothorax. There are several right-sided rib fractures this involves the third, fourth, fifth, sixth and possibly the seventh right posterolateral ribs. IMPRESSION: Right-sided rib fractures. Right-sided hydro or hemo pneumothorax. Electronically Signed   By: Rudie MeyerP.  Gallerani M.D.   On: 04/23/2021 12:14   CT CHEST ABDOMEN PELVIS W CONTRAST  Result  Date: 04/23/2021 CLINICAL DATA:  Abdominal trauma. Syncope. Fall from horse 1 week ago. EXAM: CT CHEST, ABDOMEN, AND PELVIS WITH CONTRAST TECHNIQUE: Multidetector CT imaging of the chest, abdomen and pelvis was performed following the standard protocol during bolus administration of intravenous contrast. CONTRAST:  100mL OMNIPAQUE IOHEXOL 300 MG/ML  SOLN COMPARISON:  Same day chest x-ray, abdominopelvic CT 08/22/2009 FINDINGS: CT CHEST FINDINGS Cardiovascular: Heart size is normal. No pericardial fluid/effusion. Thoracic aorta is nonaneurysmal. Main pulmonary trunk is nondilated. Mediastinum/Nodes: No mediastinal hematoma. No axillary, mediastinal, or hilar lymphadenopathy. Trachea midline. No abnormal shift of the heart or mediastinal structures. Thyroid, trachea,  and esophagus within normal limits. Lungs/Pleura: Moderate-large right-sided hemopneumothorax with large fluid component. Air component approximately 25% volume. Mixed density pleural fluid compatible with blood products. Consolidation within the dependent portion of the lung may reflect a combination of atelectasis and pulmonary contusion. The left lung is clear without pleural effusion or pneumothorax. Musculoskeletal: Multiple acute right-sided rib fractures involving the right third through seventh ribs as well as the right tenth rib. The right sixth, seventh, and tenth rib fractures are moderately displaced. No left-sided rib fracture identified. Thoracic vertebral body heights and alignment are maintained. No chest wall hematoma or soft tissue emphysema. CT ABDOMEN PELVIS FINDINGS Hepatobiliary: No hepatic injury or perihepatic hematoma. Gallbladder is unremarkable. Pancreas: Unremarkable. No pancreatic ductal dilatation or surrounding inflammatory changes. Spleen: No splenic injury or perisplenic hematoma. Adrenals/Urinary Tract: Unremarkable adrenal glands without evidence of adrenal hemorrhage. Kidneys enhance symmetrically without focal lesion,  stone, or hydronephrosis. No evidence of renal injury or perinephric hematoma. The ureters are nondilated. Urinary bladder is partially decompressed, limiting its evaluation. Stomach/Bowel: Stomach is within normal limits. Appendix appears normal. No evidence of bowel wall thickening, distention, or inflammatory changes. Vascular/Lymphatic: Scattered aortoiliac atherosclerotic calcifications without aneurysm. Numerous mildly prominent retroperitoneal and mesenteric lymph nodes are similar in size, number, and distribution from prior CT of 2010. Reproductive: Prostate is unremarkable. Other: No free fluid or evidence of hemoperitoneum. No pneumoperitoneum. No abdominal wall hernia. Musculoskeletal: Lumbar vertebral body heights and alignment are maintained. Pelvic bony ring intact without fracture or diastasis. Hips intact without fracture or dislocation. Induration overlies the lateral aspect of the right abdominal wall. No abdominal or pelvic wall hematoma. IMPRESSION: 1. Moderate-large right-sided hemopneumothorax. No evidence of tension component. 2. Multiple acute right-sided rib fractures involving the right third through seventh ribs as well as the right tenth rib. The right sixth, seventh, and tenth rib fractures are moderately displaced. 3. No evidence of acute traumatic injury within the abdomen or pelvis. 4. Multiple prominent retroperitoneal and mesenteric lymph nodes are similar in size, number, and distribution from prior CT of 2010 and favored benign. Aortic Atherosclerosis (ICD10-I70.0). These results were called by telephone at the time of interpretation on 04/23/2021 at 1:18 pm to provider Kris Mouton , who verbally acknowledged these results. Electronically Signed   By: Duanne Guess D.O.   On: 04/23/2021 13:21   DG CHEST PORT 1 VIEW  Result Date: 04/25/2021 CLINICAL DATA:  Right pneumothorax.  Chest tube. EXAM: PORTABLE CHEST 1 VIEW COMPARISON:  04/24/2021. FINDINGS: Right chest tube in  stable position. Small right apical pneumothorax again noted. Pneumothorax has increased slightly from prior exam. Persistent mild right base atelectasis. Tiny right pleural effusion cannot be excluded. Mediastinum and hilar structures normal. Heart size normal. Multiple right rib fractures are again noted. Right chest wall subcutaneous emphysema again noted. IMPRESSION: 1. Right chest tube in stable position. Right apical pneumothorax again noted. Pneumothorax is increased slightly in size from prior exam. Right chest wall subcutaneous emphysema again noted. 2. Persistent mild right base atelectasis. Tiny right pleural effusion cannot be excluded. 3.  Multiple right rib fractures again noted. Electronically Signed   By: Maisie Fus  Register   On: 04/25/2021 06:27   DG Chest Port 1 View  Result Date: 04/24/2021 CLINICAL DATA:  Right-sided chest tube.  Hemopneumothorax. EXAM: PORTABLE CHEST 1 VIEW COMPARISON:  One-view chest x-ray 04/23/2021 FINDINGS: Heart size normal. Atherosclerotic calcifications noted in the aorta. Large-bore right-sided chest tube now in place. Tiny pneumothorax remains, near completely resolved. Multiple right-sided rib fractures  again noted. Hazy opacity present over the right lung with markedly improved aeration. Minimal atelectasis is present at the right base. Subcutaneous emphysema present in the axilla. IMPRESSION: 1. Interval placement of right-sided chest tube with near complete resolution of right pneumothorax. 2. Multiple right-sided rib fractures. 3. Aeration of the right lung with some residual asymmetric edema or atelectasis. Electronically Signed   By: Marin Roberts M.D.   On: 04/24/2021 08:21   DG Chest Port 1 View  Result Date: 04/23/2021 CLINICAL DATA:  Respiratory failure. Status post right chest tube placement today. EXAM: PORTABLE CHEST 1 VIEW COMPARISON:  Single-view of the chest earlier today. FINDINGS: New right chest tube is in place. Right pneumothorax seen  on the prior examination has almost completely resolved with only a small residual seen. Right pleural effusion and basilar atelectasis have also markedly improved. The left lung is expanded and clear. Heart size is normal. There is some subcutaneous emphysema in the right chest wall. Right rib fractures noted. IMPRESSION: New right chest tube in place. Right pneumothorax is almost completely resolved in the patient's right pleural effusion is markedly decreased. No new abnormality. Multiple right rib fractures. Electronically Signed   By: Drusilla Kanner M.D.   On: 04/23/2021 14:32    Anti-infectives: Anti-infectives (From admission, onward)    None        Assessment/Plan Thrown from horse 1 week prior to admission Near syncope R rib fractures with HPTX - chest tube placed in ED 7/26 w/ prompt output of 2.1L sanguinous fluid. in cannister last 24H. CXR this AM w/ continued to slightly worse apical PTX. No air leak. Increase to -40 this AM. Repeat CXR tomorrow am. Pulm toilet, multimodal pain control, incentive spirometry T1DM - he has insulin pump (RUE). CBG q 4 hours. Appreciate DM coordinator for assistance w/ insulin pump therapy orders HTN - reports prior hx and not currently on any medication prior to admission. Hypertensive this AM (last BP 155/73). PRN meds  Thyroid disease - home meds  FEN: regular ID: none currently VTE: lovenox, SCDs Foley - None Dispo - Cleared by PT/OT. CT management as above    LOS: 2 days    Eric Form , [facility] 04/25/2021, 8:50 AM Please see Amion for pager number during day hours 7:00am-4:30pm

## 2021-04-26 ENCOUNTER — Inpatient Hospital Stay (HOSPITAL_COMMUNITY): Payer: Managed Care, Other (non HMO)

## 2021-04-26 LAB — BASIC METABOLIC PANEL
Anion gap: 8 (ref 5–15)
BUN: 9 mg/dL (ref 6–20)
CO2: 27 mmol/L (ref 22–32)
Calcium: 8.2 mg/dL — ABNORMAL LOW (ref 8.9–10.3)
Chloride: 99 mmol/L (ref 98–111)
Creatinine, Ser: 0.77 mg/dL (ref 0.61–1.24)
GFR, Estimated: >60 mL/min (ref >60)
Glucose, Bld: 146 mg/dL — ABNORMAL HIGH (ref 70–99)
Potassium: 4 mmol/L (ref 3.5–5.1)
Sodium: 134 mmol/L — ABNORMAL LOW (ref 135–145)

## 2021-04-26 LAB — GLUCOSE, CAPILLARY
Glucose-Capillary: 105 mg/dL — ABNORMAL HIGH (ref 70–99)
Glucose-Capillary: 119 mg/dL — ABNORMAL HIGH (ref 70–99)
Glucose-Capillary: 135 mg/dL — ABNORMAL HIGH (ref 70–99)
Glucose-Capillary: 141 mg/dL — ABNORMAL HIGH (ref 70–99)
Glucose-Capillary: 195 mg/dL — ABNORMAL HIGH (ref 70–99)
Glucose-Capillary: 203 mg/dL — ABNORMAL HIGH (ref 70–99)
Glucose-Capillary: 263 mg/dL — ABNORMAL HIGH (ref 70–99)

## 2021-04-26 LAB — CBC
HCT: 24.9 % — ABNORMAL LOW (ref 39.0–52.0)
Hemoglobin: 8.5 g/dL — ABNORMAL LOW (ref 13.0–17.0)
MCH: 31.5 pg (ref 26.0–34.0)
MCHC: 34.1 g/dL (ref 30.0–36.0)
MCV: 92.2 fL (ref 80.0–100.0)
Platelets: 255 10*3/uL (ref 150–400)
RBC: 2.7 MIL/uL — ABNORMAL LOW (ref 4.22–5.81)
RDW: 11.8 % (ref 11.5–15.5)
WBC: 8.6 10*3/uL (ref 4.0–10.5)
nRBC: 0 % (ref 0.0–0.2)

## 2021-04-26 NOTE — Progress Notes (Signed)
Subjective: CC: R rib pain Pain is stable. He is ambulating and using IS. No respiratory, abdominal, voiding complaints   Objective: Vital signs in last 24 hours: Temp:  [98.2 F (36.8 C)-98.8 F (37.1 C)] 98.3 F (36.8 C) (07/29 0500) Pulse Rate:  [73-79] 77 (07/28 2014) Resp:  [15-23] 15 (07/29 0000) BP: (116-155)/(61-85) 125/70 (07/29 0500) SpO2:  [94 %-96 %] 94 % (07/29 0000) Last BM Date: 04/25/21  Intake/Output from previous day: 07/28 0701 - 07/29 0700 In: 480 [P.O.:480] Out: 1336 [Urine:800; Chest Tube:536] Intake/Output this shift: No intake/output data recorded.  Family History  Problem Relation Age of Onset   Diabetes Paternal Grandfather    Stroke Paternal Grandfather    Heart disease Paternal Grandfather     Past Medical History:  Diagnosis Date   Diabetes mellitus without complication (HCC)    Hypertension    Thyroid disease     History reviewed. No pertinent surgical history.  Social History:  reports that he has been smoking cigarettes. He has been smoking an average of 1 pack per day. He has never used smokeless tobacco. He reports current alcohol use. He reports that he does not use drugs.  Allergies:  Allergies  Allergen Reactions   Wellbutrin [Bupropion] Swelling and Other (See Comments)    Gum swelling   Levemir [Insulin Detemir] Rash    Medications Prior to Admission  Medication Sig Dispense Refill   atorvastatin (LIPITOR) 20 MG tablet Take 1 tablet (20 mg total) by mouth daily. 90 tablet 3   Insulin Disposable Pump (OMNIPOD 5 PACK) MISC Use as instructed change pod every 1.5 days (Patient taking differently: Inject 1 Dose into the skin See admin instructions. Use as instructed change pod every 3 days. 100 units for 3 days) 27 each 2   levothyroxine (SYNTHROID) 200 MCG tablet Take 1 tablet (200 mcg total) by mouth daily before breakfast. 90 tablet 3   NOVOLOG FLEXPEN 100 UNIT/ML FlexPen USE TO INJECT 10-15 UNITS 3 TIMES A DAY.  (Patient taking differently: Inject 10 Units into the skin See admin instructions. Use to inject 10 units 3 times a day. Only if omnipod don't work) 15 pen 1   Continuous Blood Gluc Receiver (FREESTYLE LIBRE 2 READER) DEVI 1 each by Does not apply route daily. 1 each 0   Continuous Blood Gluc Sensor (FREESTYLE LIBRE 2 SENSOR) MISC 1 each by Does not apply route every 14 (fourteen) days. 6 each 3   FREESTYLE TEST STRIPS test strip Use as instructed to test blood sugar up to 4 times daily. 100 each 5   glucagon (GLUCAGEN) 1 MG SOLR injection Inject 1 mg into the muscle once as needed for up to 1 dose for low blood sugar. 1 each 11   Glucagon 3 MG/DOSE POWD Place 3 mg into the nose once as needed for up to 1 dose. (Patient taking differently: Place 3 mg into the nose once as needed (low sugar).) 1 each 11   Insulin Lispro-aabc (LYUMJEV) 100 UNIT/ML SOLN Inject up to 80 units into the skin daily via pump. (Patient not taking: No sig reported) 90 mL 3   Insulin Pen Needle 32G X 6 MM MISC To use with insulin pen. 100 each 4   levothyroxine (SYNTHROID) 25 MCG tablet Take 1 tablet (25 mcg total) by mouth daily before breakfast. Along with the 200 mcg tablet. (Patient not taking: No sig reported) 90 tablet 3   losartan (COZAAR) 25 MG tablet TAKE  1 TABLET BY MOUTH DAILY (Patient not taking: No sig reported) 30 tablet 1    Physical Exam:  Vitals:   04/26/21 0500 04/26/21 0732  BP: 125/70 116/69  Pulse:  65  Resp:  12  Temp: 98.3 F (36.8 C) 98.5 F (36.9 C)  SpO2:  92%   General: pleasant, WD,  male who is sitting up in bed in NAD HEENT: head is normocephalic, atraumatic. Mouth is pink and moist Heart: regular, rate, and rhythm. Palpable pedal pulses bilaterally Lungs: CTA b/l,  Respiratory effort nonlabored with normal rate. R chest tube in place w/ c/d/I bandage. CT to -40. No air leak. Bloody/SS drainage in cannister  Abd: Soft, NT, ND Skin: warm and dry with no masses, lesions, or rashes Psych:  A&Ox3 with an appropriate affect Msk: MAE's. All 4 extremities are symmetrical with no cyanosis, clubbing, or edema.   Lab Results:  Recent Labs    04/25/21 0231 04/26/21 0340  WBC 8.6 8.6  HGB 8.7* 8.5*  HCT 25.6* 24.9*  PLT 252 255    BMET Recent Labs    04/25/21 0231 04/26/21 0340  NA 133* 134*  K 4.0 4.0  CL 100 99  CO2 27 27  GLUCOSE 147* 146*  BUN 10 9  CREATININE 0.79 0.77  CALCIUM 7.9* 8.2*    PT/INR Recent Labs    04/23/21 1250  LABPROT 15.7*  INR 1.3*    CMP     Component Value Date/Time   NA 134 (L) 04/26/2021 0340   K 4.0 04/26/2021 0340   CL 99 04/26/2021 0340   CO2 27 04/26/2021 0340   GLUCOSE 146 (H) 04/26/2021 0340   BUN 9 04/26/2021 0340   CREATININE 0.77 04/26/2021 0340   CREATININE 1.02 08/01/2019 1004   CALCIUM 8.2 (L) 04/26/2021 0340   PROT 5.0 (L) 04/23/2021 1150   ALBUMIN 2.8 (L) 04/23/2021 1150   AST 20 04/23/2021 1150   ALT 23 04/23/2021 1150   ALKPHOS 66 04/23/2021 1150   BILITOT 0.4 04/23/2021 1150   GFRNONAA >60 04/26/2021 0340   GFRNONAA 85 08/01/2019 1004   GFRAA 98 08/01/2019 1004   Lipase  No results found for: LIPASE  Studies/Results: DG CHEST PORT 1 VIEW  Result Date: 04/25/2021 CLINICAL DATA:  Right pneumothorax.  Chest tube. EXAM: PORTABLE CHEST 1 VIEW COMPARISON:  04/24/2021. FINDINGS: Right chest tube in stable position. Small right apical pneumothorax again noted. Pneumothorax has increased slightly from prior exam. Persistent mild right base atelectasis. Tiny right pleural effusion cannot be excluded. Mediastinum and hilar structures normal. Heart size normal. Multiple right rib fractures are again noted. Right chest wall subcutaneous emphysema again noted. IMPRESSION: 1. Right chest tube in stable position. Right apical pneumothorax again noted. Pneumothorax is increased slightly in size from prior exam. Right chest wall subcutaneous emphysema again noted. 2. Persistent mild right base atelectasis. Tiny right  pleural effusion cannot be excluded. 3.  Multiple right rib fractures again noted. Electronically Signed   By: Maisie Fus  Register   On: 04/25/2021 06:27    Anti-infectives: Anti-infectives (From admission, onward)    None        Assessment/Plan Thrown from horse 1 week prior to admission Near syncope R rib fractures with HPTX - chest tube placed in ED 7/26 w/ prompt output of 2.1L sanguinous fluid. 536 mL output in last 24H. CXR this AM w/ small apical PTX. No air leak. Waterseal this am. Repeat CXR tomorrow am. Pulm toilet, multimodal pain control, incentive spirometry T1DM -  he has insulin pump (RUE). CBG q 4 hours. Appreciate DM coordinator for assistance w/ insulin pump therapy orders HTN - reports prior hx and not currently on any medication prior to admission. BP stable. PRN meds  Thyroid disease - home meds  ABLA - 8.5 (8.7) stable, monitor  FEN: regular ID: none currently VTE: lovenox, SCDs Foley - None  Dispo - Cleared by PT/OT. CT management as above    LOS: 3 days    Eric Form , [facility] Surgery 04/26/2021, 7:26 AM Please see Amion for pager number during day hours 7:00am-4:30pm

## 2021-04-27 ENCOUNTER — Inpatient Hospital Stay (HOSPITAL_COMMUNITY): Payer: Managed Care, Other (non HMO)

## 2021-04-27 LAB — CBC
HCT: 25.5 % — ABNORMAL LOW (ref 39.0–52.0)
Hemoglobin: 8.7 g/dL — ABNORMAL LOW (ref 13.0–17.0)
MCH: 31.6 pg (ref 26.0–34.0)
MCHC: 34.1 g/dL (ref 30.0–36.0)
MCV: 92.7 fL (ref 80.0–100.0)
Platelets: 293 10*3/uL (ref 150–400)
RBC: 2.75 MIL/uL — ABNORMAL LOW (ref 4.22–5.81)
RDW: 11.9 % (ref 11.5–15.5)
WBC: 9.3 10*3/uL (ref 4.0–10.5)
nRBC: 0 % (ref 0.0–0.2)

## 2021-04-27 LAB — GLUCOSE, CAPILLARY
Glucose-Capillary: 156 mg/dL — ABNORMAL HIGH (ref 70–99)
Glucose-Capillary: 165 mg/dL — ABNORMAL HIGH (ref 70–99)
Glucose-Capillary: 177 mg/dL — ABNORMAL HIGH (ref 70–99)
Glucose-Capillary: 209 mg/dL — ABNORMAL HIGH (ref 70–99)
Glucose-Capillary: 222 mg/dL — ABNORMAL HIGH (ref 70–99)
Glucose-Capillary: 285 mg/dL — ABNORMAL HIGH (ref 70–99)

## 2021-04-27 LAB — BASIC METABOLIC PANEL
Anion gap: 8 (ref 5–15)
BUN: 12 mg/dL (ref 6–20)
CO2: 28 mmol/L (ref 22–32)
Calcium: 8.3 mg/dL — ABNORMAL LOW (ref 8.9–10.3)
Chloride: 97 mmol/L — ABNORMAL LOW (ref 98–111)
Creatinine, Ser: 0.93 mg/dL (ref 0.61–1.24)
GFR, Estimated: >60 mL/min (ref >60)
Glucose, Bld: 215 mg/dL — ABNORMAL HIGH (ref 70–99)
Potassium: 4 mmol/L (ref 3.5–5.1)
Sodium: 133 mmol/L — ABNORMAL LOW (ref 135–145)

## 2021-04-27 NOTE — Progress Notes (Signed)
Patient ID: [name], male   DOB: 12/07/1965, 55 y.o.   MRN: [id number]     Subjective: Up in chair, no SOB ROS negative except as listed above. Objective: Vital signs in last 24 hours: Temp:  [97.6 F (36.4 C)-98.7 F (37.1 C)] 98.7 F (37.1 C) (07/30 0416) Pulse Rate:  [72-78] 72 (07/30 0416) Resp:  [12-18] 13 (07/30 0416) BP: (138-155)/(66-82) 140/70 (07/30 0416) SpO2:  [92 %-96 %] 96 % (07/30 0416) Last BM Date: 04/26/21  Intake/Output from previous day: 07/29 0701 - 07/30 0700 In: 950 [P.O.:950] Out: 350 [Chest Tube:350] Intake/Output this shift: No intake/output data recorded.  General appearance: cooperative Resp: clear to auscultation bilaterally Cardio: regular rate and rhythm GI: soft, NT Extremities: no edema  Lab Results: CBC  Recent Labs    04/26/21 0340 04/27/21 0314  WBC 8.6 9.3  HGB 8.5* 8.7*  HCT 24.9* 25.5*  PLT 255 293   BMET Recent Labs    04/26/21 0340 04/27/21 0314  NA 134* 133*  K 4.0 4.0  CL 99 97*  CO2 27 28  GLUCOSE 146* 215*  BUN 9 12  CREATININE 0.77 0.93  CALCIUM 8.2* 8.3*   PT/INR No results for input(s): LABPROT, INR in the last 72 hours. ABG No results for input(s): PHART, HCO3 in the last 72 hours.  Invalid input(s): PCO2, PO2  Studies/Results:   Anti-infectives: Anti-infectives (From admission, onward)    None       Assessment/Plan: Thrown from horse 1 week prior to admission Near syncope R rib fractures with HPTX - chest tube placed in ED 7/26 w/ prompt output of 2.1L sanguinous fluid. On water seal with 350 out/24h, still apical PTX, await output to decrease T1DM - he has insulin pump (RUE). CBG q 4 hours. Appreciate DM coordinator for assistance w/ insulin pump therapy orders HTN - reports prior hx and not currently on any medication prior to admission. BP stable. PRN meds  Thyroid disease - home meds  ABLA - stable, monitor  FEN: regular ID: none currently VTE: lovenox, SCDs Foley -  None  Dispo - Cleared by PT/OT. CT management as above   LOS: 4 days    Violeta Gelinas, MD, MPH, FACS Trauma & General Surgery Use [email or web address] to contact on call provider  04/27/2021

## 2021-04-28 ENCOUNTER — Inpatient Hospital Stay (HOSPITAL_COMMUNITY): Payer: Managed Care, Other (non HMO)

## 2021-04-28 LAB — GLUCOSE, CAPILLARY
Glucose-Capillary: 177 mg/dL — ABNORMAL HIGH (ref 70–99)
Glucose-Capillary: 174 mg/dL — ABNORMAL HIGH (ref 70–99)
Glucose-Capillary: 147 mg/dL — ABNORMAL HIGH (ref 70–99)

## 2021-04-28 LAB — BASIC METABOLIC PANEL
Anion gap: 6 (ref 5–15)
BUN: 15 mg/dL (ref 6–20)
CO2: 30 mmol/L (ref 22–32)
Calcium: 8.1 mg/dL — ABNORMAL LOW (ref 8.9–10.3)
Chloride: 98 mmol/L (ref 98–111)
Creatinine, Ser: 0.86 mg/dL (ref 0.61–1.24)
GFR, Estimated: >60 mL/min (ref >60)
Glucose, Bld: 170 mg/dL — ABNORMAL HIGH (ref 70–99)
Potassium: 4.3 mmol/L (ref 3.5–5.1)
Sodium: 134 mmol/L — ABNORMAL LOW (ref 135–145)

## 2021-04-28 LAB — CBC
HCT: 25.2 % — ABNORMAL LOW (ref 39.0–52.0)
Hemoglobin: 8.4 g/dL — ABNORMAL LOW (ref 13.0–17.0)
MCH: 31.5 pg (ref 26.0–34.0)
MCHC: 33.3 g/dL (ref 30.0–36.0)
MCV: 94.4 fL (ref 80.0–100.0)
Platelets: 351 10*3/uL (ref 150–400)
RBC: 2.67 MIL/uL — ABNORMAL LOW (ref 4.22–5.81)
RDW: 11.9 % (ref 11.5–15.5)
WBC: 9.7 10*3/uL (ref 4.0–10.5)
nRBC: 0 % (ref 0.0–0.2)

## 2021-04-28 MED ORDER — OXYCODONE HCL 5 MG PO TABS: 5.0000 mg | ORAL_TABLET | ORAL | 0 refills | Status: DC | PRN

## 2021-04-28 MED ORDER — METHOCARBAMOL 500 MG PO TABS: 500.0000 mg | ORAL_TABLET | Freq: Four times a day (QID) | ORAL | 0 refills | Status: DC

## 2021-04-28 NOTE — Progress Notes (Signed)
Patient ID: Willie Spencer, male   DOB: 1966-04-05, 55 y.o.   MRN: [id number]     Subjective: Up in chair, doing well ROS negative except as listed above. Objective: Vital signs in last 24 hours: Temp:  [97.6 F (36.4 C)-98.5 F (36.9 C)] 97.6 F (36.4 C) (07/31 0816) Pulse Rate:  [66-83] 71 (07/31 0816) Resp:  [11-20] 20 (07/31 0816) BP: (127-149)/(64-80) 139/72 (07/31 0816) SpO2:  [95 %-99 %] 96 % (07/31 0816) Last BM Date: 04/26/21  Intake/Output from previous day: 07/30 0701 - 07/31 0700 In: -  Out: 30 [Chest Tube:30] Intake/Output this shift: No intake/output data recorded.  General appearance: cooperative Resp: clear to auscultation bilaterally Chest wall: right sided chest wall tenderness Cardio: regular rate and rhythm GI: soft, NT  Lab Results: CBC  Recent Labs    04/27/21 0314 04/28/21 0237  WBC 9.3 9.7  HGB 8.7* 8.4*  HCT 25.5* 25.2*  PLT 293 351   BMET Recent Labs    04/27/21 0314 04/28/21 0237  NA 133* 134*  K 4.0 4.3  CL 97* 98  CO2 28 30  GLUCOSE 215* 170*  BUN 12 15  CREATININE 0.93 0.86  CALCIUM 8.3* 8.1*   PT/INR No results for input(s): LABPROT, INR in the last 72 hours. ABG No results for input(s): PHART, HCO3 in the last 72 hours.  Invalid input(s): PCO2, PO2  Studies/Results: DG CHEST PORT 1 VIEW  Result Date: 04/28/2021 CLINICAL DATA:  Follow-up of right-sided pneumothorax EXAM: PORTABLE CHEST 1 VIEW COMPARISON:  Yesterday FINDINGS: Hyperinflation. Right chest tube unchanged in position. Multiple right-sided rib fractures. Midline trachea. Normal heart size. Trace right pleural fluid, new. Visceral pleural line is identified 9 mm from the chest wall, similar to 10 mm on the prior exam. On the order of 5%. Slight worsening right base atelectasis. Scarring or subsegmental atelectasis at the left lung base. IMPRESSION: 1. Right chest tube in place with similar 5% right pneumothorax. 2. Worsened right base atelectasis with developing  pleural fluid. Electronically Signed   By: Jeronimo Greaves M.D.   On: 04/28/2021 08:18   DG CHEST PORT 1 VIEW  Result Date: 04/27/2021 CLINICAL DATA:  Follow-up pneumothorax. EXAM: PORTABLE CHEST 1 VIEW COMPARISON:  04/26/2021 FINDINGS: Right-sided chest tube is again noted and appears relatively stable from previous exam. Small right apical pneumothorax is again noted measuring approximately 1 cm. Unchanged from previous exam. Right posterolateral acute rib fractures are again noted in appears stable. IMPRESSION: 1. No change in small right apical pneumothorax. 2. Stable right chest tube position. Electronically Signed   By: Signa Kell M.D.   On: 04/27/2021 11:38    Anti-infectives: Anti-infectives (From admission, onward)    None       Assessment/Plan: Thrown from horse 1 week prior to admission Near syncope R rib fractures with HPTX - chest tube placed in ED 7/26 w/ prompt output of 2.1L sanguinous fluid. On water seal with 30cc/24h and stable apical PTX - will remove T1DM - he has insulin pump (RUE). CBG q 4 hours. Appreciate DM coordinator for assistance w/ insulin pump therapy orders HTN - reports prior hx and not currently on any medication prior to admission. BP stable. PRN meds  Thyroid disease - home meds  ABLA - stable, monitor  FEN: regular ID: none currently VTE: lovenox, SCDs Foley - None  Dispo - D/C chest tube, CXR at 1200 and D/C if OK   LOS: 5 days    Violeta Gelinas, MD, MPH,  FACS Trauma & General Surgery Use [email or web address] to contact on call provider  04/28/2021

## 2021-04-28 NOTE — Progress Notes (Signed)
CT removed by RN. Pt tolerated well.

## 2021-04-28 NOTE — Progress Notes (Signed)
D/C education given to Pt and all questions answered. No printed prescriptions to give or equipment to deliver. IVs removed. Pt taken to car with all belongings.   ?

## 2021-05-02 NOTE — Discharge Summary (Signed)
Physician Discharge Summary  Patient ID: BYRD RUSHLOW MRN: [id number] DOB/AGE: January 11, 1966 55 y.o.  Admit date: 04/23/2021 Discharge date: 04/28/2021  Admission Diagnoses Trauma [T14.90XA] Hemothorax on right [J94.2]  Discharge Diagnoses Patient Active Problem List   Diagnosis Date Noted   Hemothorax on right 04/23/2021   HTN (hypertension) 01/29/2016   Type 1 diabetes mellitus with hyperglycemia ([place]) 07/09/2015   Smoker 07/09/2015    Consultants none  Procedures Right tube thoracostomy - Dr. Bobbye Morton 04/23/21  HPI: Patient is a 55 year old male who presented to [facility] 7/26 via EMS after near syncopal event while driving. Patient reported he started feeling lightheaded but was able to pull over. Patient reported he was thrown from a horse about 1 week ago. He had significant bruising to abdomen and flank. Pain was mostly with deep inspiration but constant - he attributed pain to muscle injury from fall. He denied head injury, LOC, injury to any extremities from accident. He reported for 3 days following the accident his pain was extreme and limited mobility - he mostly sat in his chair. Since then he had returned to work and denied significant shortness of breath.  Hospital Course:  Work up in the ED was significant for right rib fractures and right hemopneumothorax and he was admitted to the trauma service for further evaluation and treatment. A chest tube was placed in the emergency department with prompt output of 2.1 L of sanguinous fluid and post chest tube x-ray with pneumothorax almost completely resolved and pleural effusion decreased.  Chest tube was placed to suction and serial chest x-rays performed.  He required increase in suction from -20 cm/h2o to -40 cm/h2o but pneumothorax did improve and remained stable on waterseal. Due to effusion chest tube was left in place and output monitored. Output appropriately declined and chest tube was removed with pneumothorax remaining stable.  He had acute blood loss anemia on admission and this remained stable during admission.  During admission his chronic medical conditions were addressed. His type 1 diabetes was managed with home therapy pump, hypertension monitored and managed with as needed medications and thyroid disease managed with home medications.   On date of discharge patient had appropriately progressed and met criteria for safe discharge with the support of his wife.  I discussed discharge instructions with patient as well as return precautions and all questions and concerns were addressed.   I or a member of my team have reviewed this patient in the Controlled Substance Database.  Patient agrees to follow up as below.   Allergies as of 04/28/2021       Reactions   Wellbutrin [bupropion] Swelling, Other (See Comments)   Gum swelling   Levemir [insulin Detemir] Rash        Medication List     TAKE these medications    atorvastatin 20 MG tablet Commonly known as: LIPITOR Take 1 tablet (20 mg total) by mouth daily.   FreeStyle Libre 2 Reader [place] 1 each by Does not apply route daily.   FreeStyle Libre 2 Sensor Misc 1 each by Does not apply route every 14 (fourteen) days.   FREESTYLE TEST STRIPS test strip Generic drug: glucose blood Use as instructed to test blood sugar up to 4 times daily.   glucagon 1 MG Solr injection Commonly known as: GLUCAGEN Inject 1 mg into the muscle once as needed for up to 1 dose for low blood sugar.   Insulin Pen Needle 32G X 6 MM Misc To use with  insulin pen.   levothyroxine 200 MCG tablet Commonly known as: SYNTHROID Take 1 tablet (200 mcg total) by mouth daily before breakfast.   methocarbamol 500 MG tablet Commonly known as: ROBAXIN Take 1 tablet (500 mg total) by mouth 4 (four) times daily.   oxyCODONE 5 MG immediate release tablet Commonly known as: Oxy IR/ROXICODONE Take 1-2 tablets (5-10 mg total) by mouth every 4 (four) hours as needed for moderate  pain or severe pain (95m for moderate pain, 163mfor severe pain).       ASK your doctor about these medications    Glucagon 3 MG/DOSE Powd Place 3 mg into the nose once as needed for up to 1 dose.   NovoLOG FlexPen 100 UNIT/ML FlexPen Generic drug: insulin aspart USE TO INJECT 10-15 UNITS 3 TIMES A DAY.   Omnipod Classic Pods (Gen 3) Misc Use as instructed change pod every 1.5 days          Follow-up [place] in 2 week(s).   Why: Follow up chest x-ray. We will call you with results and schedule you an appointment in trauma clinic if needed. Contact information: [phone]       [facility].   Why: As needed Contact information: [id number] Follow up.   Specialty: Family Medicine Contact information: [id number] [phone]               Signed: MaCaroll RanchereJames H. [facility] 05/02/2021, 3:11 PM Please see Amion for pager number during day hours 7:00am-4:30pm

## 2021-05-08 ENCOUNTER — Other Ambulatory Visit: Payer: Self-pay

## 2021-05-08 ENCOUNTER — Ambulatory Visit
Admission: RE | Admit: 2021-05-08 | Discharge: 2021-05-08 | Disposition: A | Discharge status: Home / Self Care | Payer: Managed Care, Other (non HMO) | Source: Ambulatory Visit | Attending: Physician Assistant | Admitting: Physician Assistant

## 2021-05-08 DIAGNOSIS — S272XXA Traumatic hemopneumothorax, initial encounter: Secondary | ICD-10-CM

## 2021-05-09 ENCOUNTER — Other Ambulatory Visit: Payer: Self-pay | Admitting: Physician Assistant

## 2021-05-09 ENCOUNTER — Other Ambulatory Visit (HOSPITAL_COMMUNITY): Payer: Self-pay | Admitting: Physician Assistant

## 2021-05-09 DIAGNOSIS — S271XXD Traumatic hemothorax, subsequent encounter: Secondary | ICD-10-CM

## 2021-05-14 ENCOUNTER — Other Ambulatory Visit (HOSPITAL_COMMUNITY): Payer: Self-pay | Admitting: Physician Assistant

## 2021-05-14 ENCOUNTER — Other Ambulatory Visit: Payer: Self-pay

## 2021-05-14 ENCOUNTER — Ambulatory Visit (HOSPITAL_COMMUNITY)
Admission: RE | Admit: 2021-05-14 | Discharge: 2021-05-14 | Disposition: A | Discharge status: Home / Self Care | Payer: Managed Care, Other (non HMO) | Source: Ambulatory Visit | Attending: Physician Assistant | Admitting: Physician Assistant

## 2021-05-14 DIAGNOSIS — S271XXD Traumatic hemothorax, subsequent encounter: Secondary | ICD-10-CM

## 2021-05-14 DIAGNOSIS — J90 Pleural effusion, not elsewhere classified: Secondary | ICD-10-CM | POA: Diagnosis present

## 2021-05-14 HISTORY — PX: IR THORACENTESIS RIGHT ASP PLEURAL SPACE W/IMG GUIDE: IMG5380

## 2021-05-14 MED ORDER — LIDOCAINE HCL 1 % IJ SOLN
INTRAMUSCULAR | Status: AC
  Filled 2021-05-14: qty 20

## 2021-05-14 MED ORDER — LIDOCAINE HCL (PF) 1 % IJ SOLN
INTRAMUSCULAR | Status: AC | PRN
  Administered 2021-05-14: 10 mL

## 2021-05-14 NOTE — Procedures (Signed)
PROCEDURE SUMMARY:  Successful US guided right thoracentesis. Yielded 15 ml of frothy maroon fluid. Pt tolerated procedure well. No immediate complications.  Specimen sent for labs. CXR ordered; no post-procedure pneumothorax identified.   Fluid identified on ultrasound reveals findings consistent with loculated fluid.   EBL < 2 mL  Mickie Kay,  NP 05/14/2021 10:47 AM

## 2021-05-16 ENCOUNTER — Other Ambulatory Visit: Payer: Self-pay | Admitting: Internal Medicine

## 2021-05-20 ENCOUNTER — Institutional Professional Consult (permissible substitution) (INDEPENDENT_AMBULATORY_CARE_PROVIDER_SITE_OTHER): Payer: Managed Care, Other (non HMO) | Admitting: Thoracic Surgery (Cardiothoracic Vascular Surgery)

## 2021-05-20 ENCOUNTER — Other Ambulatory Visit: Payer: Self-pay

## 2021-05-20 ENCOUNTER — Encounter: Payer: Self-pay | Admitting: Thoracic Surgery (Cardiothoracic Vascular Surgery)

## 2021-05-20 ENCOUNTER — Other Ambulatory Visit: Payer: Self-pay | Admitting: *Deleted

## 2021-05-20 VITALS — BP 143/77 | HR 86 | Resp 20 | Ht 75.0 in | Wt 234.0 lb

## 2021-05-20 DIAGNOSIS — J942 Hemothorax: Secondary | ICD-10-CM

## 2021-05-20 DIAGNOSIS — J90 Pleural effusion, not elsewhere classified: Secondary | ICD-10-CM

## 2021-05-20 NOTE — Progress Notes (Signed)
PCP is Avanell Shackleton, NP-C Referring Provider is Violeta Gelinas, MD  Chief Complaint  Patient presents with   Pleural Effusion    Initial surgical consult, Thoracentesis 8/16, CT C/A/P 7/26    HPI: Willie Spencer is sent for consultation regarding a right hemothorax.  Willie Spencer is a 55 year old man with a past history significant for type 1 diabetes, post ablative hypothyroidism, hypertension, hyperlipidemia, and tobacco abuse.  Willie Spencer was in his usual state of health until about a month ago when Willie Spencer was thrown off a horse.  Willie Spencer landed on his back and was having pain in his chest and also noted extensive bruising of his abdomen and flank.  Willie Spencer did not have any loss of consciousness.  Willie Spencer tried to tough it out for several days and then was brought to the emergency room by EMS after a near syncopal event at work.  Willie Spencer was found to have multiple right-sided rib fractures and a hemothorax.  A chest tube was placed.  That was later removed and Willie Spencer was discharged on 04/28/2021.  Willie Spencer presented back for a follow-up and was found to have a large right pleural effusion.  This was new from discharge.  Willie Spencer was sent for thoracentesis on 05/14/2021.  Only a small amount of maroon fluid was evacuated and there was no change in the effusion.  Willie Spencer was felt to have a clotted hemothorax.  Willie Spencer still has pain in the right chest.  Willie Spencer has been taking oxycodone for that.  Willie Spencer is usually only taking it about once a day.  Willie Spencer has had some shortness of breath.   Past Medical History:  Diagnosis Date   Diabetes mellitus without complication (HCC)    Hypertension    Thyroid disease     Past Surgical History:  Procedure Laterality Date   IR THORACENTESIS ASP PLEURAL SPACE W/IMG GUIDE  05/14/2021    Family History  Problem Relation Age of Onset   Diabetes Paternal Grandfather    Stroke Paternal Grandfather    Heart disease Paternal Grandfather     Social History Social History   Tobacco Use   Smoking status: Every Day     Packs/day: 1.00    Types: Cigarettes    Last attempt to quit: 08/30/2015    Years since quitting: 5.7   Smokeless tobacco: Never   Tobacco comments:    uses ecigs  Substance Use Topics   Alcohol use: Yes    Alcohol/week: 0.0 standard drinks    Comment: socially, occasionally heavier on weekends   Drug use: No    Current Outpatient Medications  Medication Sig Dispense Refill   atorvastatin (LIPITOR) 20 MG tablet Take 1 tablet (20 mg total) by mouth daily. 90 tablet 3   Continuous Blood Gluc Receiver (FREESTYLE LIBRE 2 READER) [place] 1 each by Does not apply route daily. 1 each 0   Continuous Blood Gluc Sensor (FREESTYLE LIBRE 2 SENSOR) MISC APPLY ONE SENSOR EVERY 14 DAYS TO CHECK GLUCOSE LEVELS 9 each 3   FREESTYLE TEST STRIPS test strip Use as instructed to test blood sugar up to 4 times daily. 100 each 5   glucagon (GLUCAGEN) 1 MG SOLR injection Inject 1 mg into the muscle once as needed for up to 1 dose for low blood sugar. 1 each 11   Glucagon 3 MG/DOSE POWD Place 3 mg into the nose once as needed for up to 1 dose. (Patient taking differently: Place 3 mg into the nose once as needed (low sugar).)  1 each 11   Insulin Disposable Pump (OMNIPOD 5 PACK) MISC Use as instructed change pod every 1.5 days (Patient taking differently: Inject 1 Dose into the skin See admin instructions. Use as instructed change pod every 3 days. 100 units for 3 days) 27 each 2   Insulin Pen Needle 32G X 6 MM MISC To use with insulin pen. 100 each 4   levothyroxine (SYNTHROID) 200 MCG tablet Take 1 tablet (200 mcg total) by mouth daily before breakfast. 90 tablet 3   methocarbamol (ROBAXIN) 500 MG tablet Take 1 tablet (500 mg total) by mouth 4 (four) times daily. 28 tablet 0   NOVOLOG FLEXPEN 100 UNIT/ML FlexPen USE TO INJECT 10-15 UNITS 3 TIMES A DAY. (Patient taking differently: Inject 10 Units into the skin See admin instructions. Use to inject 10 units 3 times a day. Only if omnipod don't work) 15 pen 1    oxyCODONE (OXY IR/ROXICODONE) 5 MG immediate release tablet Take 1-2 tablets (5-10 mg total) by mouth every 4 (four) hours as needed for moderate pain or severe pain (5mg for moderate pain, 10mg for severe pain). 25 tablet 0   No current facility-administered medications for this visit.    Allergies  Allergen Reactions   Wellbutrin [Bupropion] Swelling and Other (See Comments)    Gum swelling   Levemir [Insulin Detemir] Rash    Review of Systems  Constitutional:  Positive for activity change and fatigue.  HENT:  Negative for trouble swallowing and voice change.   Respiratory:  Positive for shortness of breath.        Pleuritic right-sided chest pain  Cardiovascular:  Negative for chest pain, palpitations and leg swelling.  Gastrointestinal:  Positive for abdominal pain (Frequent heartburn), blood in stool and constipation.  Endocrine:       Hypothyroidism after ablation  Genitourinary: Negative.   Musculoskeletal:        Chest wall pain  Neurological:  Positive for dizziness and syncope.   BP (!) 143/77 (BP Location: Left Arm, Patient Position: Sitting)   Pulse 86   Resp 20   Ht 6' 3" (1.905 m)   Wt 234 lb (106.1 kg)   SpO2 91% Comment: RA  BMI 29.25 kg/m  Physical Exam Vitals reviewed.  Constitutional:      General: Willie Spencer is not in acute distress.    Appearance: Normal appearance.  HENT:     Head: Normocephalic and atraumatic.  Eyes:     General: No scleral icterus.    Extraocular Movements: Extraocular movements intact.  Cardiovascular:     Rate and Rhythm: Normal rate and regular rhythm.     Heart sounds: Normal heart sounds. No murmur heard.   No friction rub. No gallop.  Pulmonary:     Effort: Pulmonary effort is normal. No respiratory distress.     Comments: Absent breath sounds right base Abdominal:     General: There is no distension.     Palpations: Abdomen is soft.  Musculoskeletal:     Cervical back: Normal range of motion and neck supple.   Lymphadenopathy:     Cervical: No cervical adenopathy.  Skin:    General: Skin is warm and dry.     Comments: Erythema at prior chest tube site, suture removed.  Neurological:     General: No focal deficit present.     Mental Status: Willie Spencer is alert and oriented to person, place, and time.     Cranial Nerves: No cranial nerve deficit.       Motor: No weakness.    Diagnostic Tests: CT CHEST, ABDOMEN, AND PELVIS WITH CONTRAST   TECHNIQUE: Multidetector CT imaging of the chest, abdomen and pelvis was performed following the standard protocol during bolus administration of intravenous contrast.   CONTRAST:  OMNIPAQUE IOHEXOL 300 MG/ML  SOLN   COMPARISON:  Same day chest x-ray, abdominopelvic CT 08/22/2009   FINDINGS: CT CHEST FINDINGS   Cardiovascular: Heart size is normal. No pericardial fluid/effusion. Thoracic aorta is nonaneurysmal. Main pulmonary trunk is nondilated.   Mediastinum/Nodes: No mediastinal hematoma. No axillary, mediastinal, or hilar lymphadenopathy. Trachea midline. No abnormal shift of the heart or mediastinal structures. Thyroid, trachea, and esophagus within normal limits.   Lungs/Pleura: Moderate-large right-sided hemopneumothorax with large fluid component. Air component approximately 25% volume. Mixed density pleural fluid compatible with blood products. Consolidation within the dependent portion of the lung may reflect a combination of atelectasis and pulmonary contusion. The left lung is clear without pleural effusion or pneumothorax.   Musculoskeletal: Multiple acute right-sided rib fractures involving the right third through seventh ribs as well as the right tenth rib. The right sixth, seventh, and tenth rib fractures are moderately displaced. No left-sided rib fracture identified. Thoracic vertebral body heights and alignment are maintained. No chest wall hematoma or soft tissue emphysema.   CT ABDOMEN PELVIS FINDINGS   Hepatobiliary: No  hepatic injury or perihepatic hematoma. Gallbladder is unremarkable.   Pancreas: Unremarkable. No pancreatic ductal dilatation or surrounding inflammatory changes.   Spleen: No splenic injury or perisplenic hematoma.   Adrenals/Urinary Tract: Unremarkable adrenal glands without evidence of adrenal hemorrhage. Kidneys enhance symmetrically without focal lesion, stone, or hydronephrosis. No evidence of renal injury or perinephric hematoma. The ureters are nondilated. Urinary bladder is partially decompressed, limiting its evaluation.   Stomach/Bowel: Stomach is within normal limits. Appendix appears normal. No evidence of bowel wall thickening, distention, or inflammatory changes.   Vascular/Lymphatic: Scattered aortoiliac atherosclerotic calcifications without aneurysm. Numerous mildly prominent retroperitoneal and mesenteric lymph nodes are similar in size, number, and distribution from prior CT of 2010.   Reproductive: Prostate is unremarkable.   Other: No free fluid or evidence of hemoperitoneum. No pneumoperitoneum. No abdominal wall hernia.   Musculoskeletal: Lumbar vertebral body heights and alignment are maintained. Pelvic bony ring intact without fracture or diastasis. Hips intact without fracture or dislocation. Induration overlies the lateral aspect of the right abdominal wall. No abdominal or pelvic wall hematoma.   IMPRESSION: 1. Moderate-large right-sided hemopneumothorax. No evidence of tension component. 2. Multiple acute right-sided rib fractures involving the right third through seventh ribs as well as the right tenth rib. The right sixth, seventh, and tenth rib fractures are moderately displaced. 3. No evidence of acute traumatic injury within the abdomen or pelvis. 4. Multiple prominent retroperitoneal and mesenteric lymph nodes are similar in size, number, and distribution from prior CT of 2010 and favored benign.   Aortic Atherosclerosis  (ICD10-I70.0).   These results were called by telephone at the time of interpretation on 04/23/2021 at 1:18 pm to provider Kris Mouton , who verbally acknowledged these results.     Electronically Signed   By: Duanne Guess D.O.   On: 04/23/2021 13:21   CHEST  1 VIEW   COMPARISON:  Chest x-ray dated May 08, 2021.   FINDINGS: The heart size and mediastinal contours are within normal limits. Large right pleural effusion, unchanged by x-ray. No pneumothorax. The left lung is clear. Acute right-sided rib fractures again noted.   IMPRESSION: 1. Unchanged large right  pleural effusion.  No pneumothorax.     Electronically Signed   By: Obie Dredge M.D.   On: 05/14/2021 10:03   Hematocrit 25.2 on 04/28/2021  Impression: Willie Spencer is a 55 year old man with a past history significant for type 1 diabetes, post ablative hypothyroidism, hypertension, hyperlipidemia, and tobacco abuse.  Willie Spencer was thrown off a horse back in July.  Willie Spencer waited about a week before coming to the hospital.  Willie Spencer had a near syncopal event.  Willie Spencer was found to have multiple rib fractures and a hemopneumothorax.  That was treated with a chest tube.  That was removed and Willie Spencer was discharged on 04/28/2020.  Willie Spencer had a follow-up chest x-ray about 10 days later which showed a large right pleural effusion.  Week later and attempted thoracentesis was unsuccessful and findings were consistent with a clotted hemothorax.  Hemothorax -at this point there is little chance of draining this with thoracentesis.  I recommended we proceed with right VATS for drainage of the hemothorax and decortication of the lung.  I informed Mr. and Mrs. Timpone of the general nature of the procedure including the need for general anesthesia, the incisions to be used, the use of drainage tubes postoperatively, the expected hospital stay, and the overall recovery.  His rib fractures are relatively posterior and I do not think rib plating would be effective in  his case.  I informed him of the indications, risks, benefits, and alternatives.  They understand the risks include, but are not limited to death, MI, DVT, PE, bleeding, possible need for transfusion, infection, air leaks, cardiac arrhythmias, as well as possibility of other unforeseeable complications.  Anemia- acute secondary to blood loss-hematocrit was 25 on 04/28/2021.  It may have improved some since then but Willie Spencer still is at higher risk than normal for needing transfusions.  Type 1 diabetes-has allergy to Levemir.  Will use NovoLog perioperatively  Plan: Right VATS for drainage of hemothorax and decortication on Friday, 05/24/2021  Loreli Slot, MD [facility] [phone]

## 2021-05-20 NOTE — H&P (View-Only) (Signed)
PCP is Avanell Shackleton, NP-C Referring Provider is Violeta Gelinas, MD  Chief Complaint  Patient presents with   Pleural Effusion    Initial surgical consult, Thoracentesis 8/16, CT C/A/P 7/26    HPI: Willie Spencer is sent for consultation regarding a right hemothorax.  Willie Spencer is a 55 year old man with a past history significant for type 1 diabetes, post ablative hypothyroidism, hypertension, hyperlipidemia, and tobacco abuse.  Willie Spencer was in his usual state of health until about a month ago when Willie Spencer was thrown off a horse.  Willie Spencer landed on his back and was having pain in his chest and also noted extensive bruising of his abdomen and flank.  Willie Spencer did not have any loss of consciousness.  Willie Spencer tried to tough it out for several days and then was brought to the emergency room by EMS after a near syncopal event at work.  Willie Spencer was found to have multiple right-sided rib fractures and a hemothorax.  A chest tube was placed.  That was later removed and Willie Spencer was discharged on 04/28/2021.  Willie Spencer presented back for a follow-up and was found to have a large right pleural effusion.  This was new from discharge.  Willie Spencer was sent for thoracentesis on 05/14/2021.  Only a small amount of maroon fluid was evacuated and there was no change in the effusion.  Willie Spencer was felt to have a clotted hemothorax.  Willie Spencer still has pain in the right chest.  Willie Spencer has been taking oxycodone for that.  Willie Spencer is usually only taking it about once a day.  Willie Spencer has had some shortness of breath.   Past Medical History:  Diagnosis Date   Diabetes mellitus without complication (HCC)    Hypertension    Thyroid disease     Past Surgical History:  Procedure Laterality Date   IR THORACENTESIS ASP PLEURAL SPACE W/IMG GUIDE  05/14/2021    Family History  Problem Relation Age of Onset   Diabetes Paternal Grandfather    Stroke Paternal Grandfather    Heart disease Paternal Grandfather     Social History Social History   Tobacco Use   Smoking status: Every Day     Packs/day: 1.00    Types: Cigarettes    Last attempt to quit: 08/30/2015    Years since quitting: 5.7   Smokeless tobacco: Never   Tobacco comments:    uses ecigs  Substance Use Topics   Alcohol use: Yes    Alcohol/week: 0.0 standard drinks    Comment: socially, occasionally heavier on weekends   Drug use: No    Current Outpatient Medications  Medication Sig Dispense Refill   atorvastatin (LIPITOR) 20 MG tablet Take 1 tablet (20 mg total) by mouth daily. 90 tablet 3   Continuous Blood Gluc Receiver (FREESTYLE LIBRE 2 READER) DEVI 1 each by Does not apply route daily. 1 each 0   Continuous Blood Gluc Sensor (FREESTYLE LIBRE 2 SENSOR) MISC APPLY ONE SENSOR EVERY 14 DAYS TO CHECK GLUCOSE LEVELS 9 each 3   FREESTYLE TEST STRIPS test strip Use as instructed to test blood sugar up to 4 times daily. 100 each 5   glucagon (GLUCAGEN) 1 MG SOLR injection Inject 1 mg into the muscle once as needed for up to 1 dose for low blood sugar. 1 each 11   Glucagon 3 MG/DOSE POWD Place 3 mg into the nose once as needed for up to 1 dose. (Patient taking differently: Place 3 mg into the nose once as needed (low sugar).)  1 each 11   Insulin Disposable Pump (OMNIPOD 5 PACK) MISC Use as instructed change pod every 1.5 days (Patient taking differently: Inject 1 Dose into the skin See admin instructions. Use as instructed change pod every 3 days. 100 units for 3 days) 27 each 2   Insulin Pen Needle 32G X 6 MM MISC To use with insulin pen. 100 each 4   levothyroxine (SYNTHROID) 200 MCG tablet Take 1 tablet (200 mcg total) by mouth daily before breakfast. 90 tablet 3   methocarbamol (ROBAXIN) 500 MG tablet Take 1 tablet (500 mg total) by mouth 4 (four) times daily. 28 tablet 0   NOVOLOG FLEXPEN 100 UNIT/ML FlexPen USE TO INJECT 10-15 UNITS 3 TIMES A DAY. (Patient taking differently: Inject 10 Units into the skin See admin instructions. Use to inject 10 units 3 times a day. Only if omnipod don't work) 15 pen 1    oxyCODONE (OXY IR/ROXICODONE) 5 MG immediate release tablet Take 1-2 tablets (5-10 mg total) by mouth every 4 (four) hours as needed for moderate pain or severe pain (  for moderate pain,  for severe pain). 25 tablet 0   No current facility-administered medications for this visit.    Allergies  Allergen Reactions   Wellbutrin [Bupropion] Swelling and Other (See Comments)    Gum swelling   Levemir [Insulin Detemir] Rash    Review of Systems  Constitutional:  Positive for activity change and fatigue.  HENT:  Negative for trouble swallowing and voice change.   Respiratory:  Positive for shortness of breath.        Pleuritic right-sided chest pain  Cardiovascular:  Negative for chest pain, palpitations and leg swelling.  Gastrointestinal:  Positive for abdominal pain (Frequent heartburn), blood in stool and constipation.  Endocrine:       Hypothyroidism after ablation  Genitourinary: Negative.   Musculoskeletal:        Chest wall pain  Neurological:  Positive for dizziness and syncope.   BP (!) 143/77 (BP Location: Left Arm, Patient Position: Sitting)   Pulse 86   Resp 20   Ht  (1.905 m)   Wt 234 lb (106.1 kg)   SpO2 91% Comment: RA  BMI 29.25 kg/m  Physical Exam Vitals reviewed.  Constitutional:      General: Willie Spencer is not in acute distress.    Appearance: Normal appearance.  HENT:     Head: Normocephalic and atraumatic.  Eyes:     General: No scleral icterus.    Extraocular Movements: Extraocular movements intact.  Cardiovascular:     Rate and Rhythm: Normal rate and regular rhythm.     Heart sounds: Normal heart sounds. No murmur heard.   No friction rub. No gallop.  Pulmonary:     Effort: Pulmonary effort is normal. No respiratory distress.     Comments: Absent breath sounds right base Abdominal:     General: There is no distension.     Palpations: Abdomen is soft.  Musculoskeletal:     Cervical back: Normal range of motion and neck supple.   Lymphadenopathy:     Cervical: No cervical adenopathy.  Skin:    General: Skin is warm and dry.     Comments: Erythema at prior chest tube site, suture removed.  Neurological:     General: No focal deficit present.     Mental Status: Willie Spencer is alert and oriented to person, place, and time.     Cranial Nerves: No cranial nerve deficit.  Motor: No weakness.    Diagnostic Tests: CT CHEST, ABDOMEN, AND PELVIS WITH CONTRAST   TECHNIQUE: Multidetector CT imaging of the chest, abdomen and pelvis was performed following the standard protocol during bolus administration of intravenous contrast.   CONTRAST:  OMNIPAQUE IOHEXOL 300 MG/ML  SOLN   COMPARISON:  Same day chest x-ray, abdominopelvic CT 08/22/2009   FINDINGS: CT CHEST FINDINGS   Cardiovascular: Heart size is normal. No pericardial fluid/effusion. Thoracic aorta is nonaneurysmal. Main pulmonary trunk is nondilated.   Mediastinum/Nodes: No mediastinal hematoma. No axillary, mediastinal, or hilar lymphadenopathy. Trachea midline. No abnormal shift of the heart or mediastinal structures. Thyroid, trachea, and esophagus within normal limits.   Lungs/Pleura: Moderate-large right-sided hemopneumothorax with large fluid component. Air component approximately 25% volume. Mixed density pleural fluid compatible with blood products. Consolidation within the dependent portion of the lung may reflect a combination of atelectasis and pulmonary contusion. The left lung is clear without pleural effusion or pneumothorax.   Musculoskeletal: Multiple acute right-sided rib fractures involving the right third through seventh ribs as well as the right tenth rib. The right sixth, seventh, and tenth rib fractures are moderately displaced. No left-sided rib fracture identified. Thoracic vertebral body heights and alignment are maintained. No chest wall hematoma or soft tissue emphysema.   CT ABDOMEN PELVIS FINDINGS   Hepatobiliary: No  hepatic injury or perihepatic hematoma. Gallbladder is unremarkable.   Pancreas: Unremarkable. No pancreatic ductal dilatation or surrounding inflammatory changes.   Spleen: No splenic injury or perisplenic hematoma.   Adrenals/Urinary Tract: Unremarkable adrenal glands without evidence of adrenal hemorrhage. Kidneys enhance symmetrically without focal lesion, stone, or hydronephrosis. No evidence of renal injury or perinephric hematoma. The ureters are nondilated. Urinary bladder is partially decompressed, limiting its evaluation.   Stomach/Bowel: Stomach is within normal limits. Appendix appears normal. No evidence of bowel wall thickening, distention, or inflammatory changes.   Vascular/Lymphatic: Scattered aortoiliac atherosclerotic calcifications without aneurysm. Numerous mildly prominent retroperitoneal and mesenteric lymph nodes are similar in size, number, and distribution from prior CT of 2010.   Reproductive: Prostate is unremarkable.   Other: No free fluid or evidence of hemoperitoneum. No pneumoperitoneum. No abdominal wall hernia.   Musculoskeletal: Lumbar vertebral body heights and alignment are maintained. Pelvic bony ring intact without fracture or diastasis. Hips intact without fracture or dislocation. Induration overlies the lateral aspect of the right abdominal wall. No abdominal or pelvic wall hematoma.   IMPRESSION: 1. Moderate-large right-sided hemopneumothorax. No evidence of tension component. 2. Multiple acute right-sided rib fractures involving the right third through seventh ribs as well as the right tenth rib. The right sixth, seventh, and tenth rib fractures are moderately displaced. 3. No evidence of acute traumatic injury within the abdomen or pelvis. 4. Multiple prominent retroperitoneal and mesenteric lymph nodes are similar in size, number, and distribution from prior CT of 2010 and favored benign.   Aortic Atherosclerosis  (ICD10-I70.0).   These results were called by telephone at the time of interpretation on 04/23/2021 at 1:18 pm to provider Kris Mouton , who verbally acknowledged these results.     Electronically Signed   By: Duanne Guess D.O.   On: 04/23/2021 13:21   CHEST  1 VIEW   COMPARISON:  Chest x-ray dated May 08, 2021.   FINDINGS: The heart size and mediastinal contours are within normal limits. Large right pleural effusion, unchanged by x-ray. No pneumothorax. The left lung is clear. Acute right-sided rib fractures again noted.   IMPRESSION: 1. Unchanged large right  pleural effusion.  No pneumothorax.     Electronically Signed   By: Obie Dredge M.D.   On: 05/14/2021 10:03   Hematocrit 25.2 on 04/28/2021  Impression: Willie Spencer is a 55 year old man with a past history significant for type 1 diabetes, post ablative hypothyroidism, hypertension, hyperlipidemia, and tobacco abuse.  Willie Spencer was thrown off a horse back in July.  Willie Spencer waited about a week before coming to the hospital.  Willie Spencer had a near syncopal event.  Willie Spencer was found to have multiple rib fractures and a hemopneumothorax.  That was treated with a chest tube.  That was removed and Willie Spencer was discharged on 04/28/2020.  Willie Spencer had a follow-up chest x-ray about 10 days later which showed a large right pleural effusion.  Week later and attempted thoracentesis was unsuccessful and findings were consistent with a clotted hemothorax.  Hemothorax -at this point there is little chance of draining this with thoracentesis.  I recommended we proceed with right VATS for drainage of the hemothorax and decortication of the lung.  I informed Willie Spencer of the general nature of the procedure including the need for general anesthesia, the incisions to be used, the use of drainage tubes postoperatively, the expected hospital stay, and the overall recovery.  His rib fractures are relatively posterior and I do not think rib plating would be effective in  his case.  I informed him of the indications, risks, benefits, and alternatives.  They understand the risks include, but are not limited to death, MI, DVT, PE, bleeding, possible need for transfusion, infection, air leaks, cardiac arrhythmias, as well as possibility of other unforeseeable complications.  Anemia- acute secondary to blood loss-hematocrit was 25 on 04/28/2021.  It may have improved some since then but Willie Spencer still is at higher risk than normal for needing transfusions.  Type 1 diabetes-has allergy to Levemir.  Will use NovoLog perioperatively  Plan: Right VATS for drainage of hemothorax and decortication on Friday, 05/24/2021  Loreli Slot, MD [facility] [phone]

## 2021-05-21 NOTE — Progress Notes (Signed)
Surgical Instructions   Your procedure is scheduled on Friday 05/24/2021.  Report to [facility] Main Entrance "A" at 05:30 A.M., then check in with the Admitting office.  Call [phone] if you have problems or questions between now and the morning of surgery:   Remember: Do not eat or drink after midnight the night before your surgery    Take these medicines the morning of surgery with A SIP OF WATER  Atorvastatin (LIpitor) Levothyroxine (Synthroid)   If needed you may take these medications the morning of surgery: Acetaminophen (Tylenol) Methocarbamol (Robaxin) Oxycodone (Oxy IR/Roxicodone)  As of today, STOP taking any Aspirin (unless otherwise instructed by your surgeon) or Aspirin-containing products; NSAIDS - Aleve, Naproxen, Ibuprofen, Motrin, Advil, Goody's, BC's, all herbal medications, fish oil, and all vitamins.  WHAT DO I DO ABOUT MY DIABETES MEDICATION?  For your instructions for your insulin pump, contact your PCP or Endocrinologist  OR  Reall all basal rates by 20% at midnight before your surgery.  The morning of surgery, if your CBG is greater than 220 mg/dL, you may take  of your sliding scale (correction) dose of insulin.   HOW TO MANAGE YOUR DIABETES BEFORE AND AFTER SURGERY  Why is it important to control my blood sugar before and after surgery? Improving blood sugar levels before and after surgery helps healing and can limit problems. A way of improving blood sugar control is eating a healthy diet by:  Eating less sugar and carbohydrates  Increasing activity/exercise  Talking with your doctor about reaching your blood sugar goals High blood sugars (greater than 180 mg/dL) can raise your risk of infections and slow your recovery, so you will need to focus on controlling your diabetes during the weeks before surgery. Make sure that the doctor who takes care of your diabetes knows about your planned surgery including the date and location.  How do I  manage my blood sugar before surgery? Check your blood sugar at least 4 times a day, starting 2 days before surgery, to make sure that the level is not too high or low.  Check your blood sugar the morning of your surgery when you wake up and every 2 hours until you get to the Short Stay unit.  If your blood sugar is less than 70 mg/dL, you will need to treat for low blood sugar: Do not take insulin. Treat a low blood sugar (less than 70 mg/dL) with  cup of clear juice (cranberry or apple), 4 glucose tablets, OR glucose gel. Recheck blood sugar in 15 minutes after treatment (to make sure it is greater than 70 mg/dL). If your blood sugar is not greater than 70 mg/dL on recheck, call [phone] for further instructions. Report your blood sugar to the short stay nurse when you get to Short Stay.  If you are admitted to the hospital after surgery: Your blood sugar will be checked by the staff and you will probably be given insulin after surgery (instead of oral diabetes medicines) to make sure you have good blood sugar levels. The goal for blood sugar control after surgery is 80-180 mg/dL.           Do not wear jewelry  Do not wear lotions, powders, colognes, or deodorant. Men may shave face and neck. Do not bring valuables to the hospital - [facility] is not responsible for any belongings or valuables.              Do NOT Smoke (Tobacco/Vaping) or drink Alcohol  24 hours prior to your procedure  If you use a CPAP at night, you may bring all equipment for your overnight stay.   Contacts, glasses, hearing aids, dentures or partials may not be worn into surgery, please bring cases for these belongings   For patients admitted to the hospital, discharge time will be determined by your treatment team.   Patients discharged the day of surgery will not be allowed to drive home, and someone needs to stay with them for 24 hours.  ONLY ONE (1) SUPPORT PERSON MAY WAIT IN THE WAITING AREA WHILE YOU  ARE IN SURGERY. NO VISITORS WILL BE ALLOWED IN PRE-OP WHERE PATIENTS GET READY FOR SURGERY.  TWO (2) VISITORS WILL BE ALLOWED IN YOUR ROOM IF YOU ARE ADMITTED AFTER SURGERY.  Minor children may have two parents present. Special consideration for safety and communication needs will be reviewed on a case by case basis.  Special instructions:    Oral Hygiene is also important to reduce your risk of infection.  Remember - BRUSH YOUR TEETH THE MORNING OF SURGERY WITH YOUR REGULAR TOOTHPASTE   [place]- Preparing For Surgery  Before surgery, you can play an important role. Because skin is not sterile, your skin needs to be as free of germs as possible. You can reduce the number of germs on your skin by washing with CHG (chlorahexidine gluconate) Soap before surgery.  CHG is an antiseptic cleaner which kills germs and bonds with the skin to continue killing germs even after washing.     Please do not use if you have an allergy to CHG or antibacterial soaps. If your skin becomes reddened/irritated stop using the CHG.  Do not shave (including legs and underarms) for at least 48 hours prior to first CHG shower. It is OK to shave your face.  Please follow these instructions carefully.     Shower the NIGHT BEFORE SURGERY and the MORNING OF SURGERY with CHG Soap.   If you chose to wash your hair, wash your hair first as usual with your normal shampoo. After you shampoo, rinse your hair and body thoroughly to remove the shampoo.    Then [employer] and genitals (private parts) with your normal soap and rinse thoroughly to remove soap.  Next use the CHG Soap as you would any other liquid soap. You can apply CHG directly to the skin and wash gently with a clean washcloth.   Apply the CHG Soap to your body ONLY FROM THE NECK DOWN.  Do not use on open wounds or open sores. Avoid contact with your eyes, ears, mouth and genitals (private parts). Wash Face and genitals (private parts)  with your normal soap.    Wash thoroughly, paying special attention to the area where your surgery will be performed.  Thoroughly rinse your body with warm water from the neck down.  DO NOT shower/wash with your normal soap after using and rinsing off the CHG Soap.  Pat yourself dry with a CLEAN TOWEL.  Wear CLEAN PAJAMAS to bed the night before surgery  Place CLEAN SHEETS on your bed the night before your surgery  DO NOT SLEEP WITH PETS.   Day of Surgery:  Take a shower with CHG soap. Wear Clean/Comfortable clothing the morning of surgery Do not apply any deodorants/lotions.   Remember to brush your teeth WITH YOUR REGULAR TOOTHPASTE.   Please read over the following fact sheets that you were given.

## 2021-05-22 ENCOUNTER — Other Ambulatory Visit: Payer: Self-pay

## 2021-05-22 ENCOUNTER — Encounter (HOSPITAL_COMMUNITY): Payer: Self-pay

## 2021-05-22 ENCOUNTER — Encounter (HOSPITAL_COMMUNITY)
Admission: RE | Admit: 2021-05-22 | Discharge: 2021-05-22 | Disposition: A | Discharge status: Home / Self Care | Payer: Managed Care, Other (non HMO) | Source: Ambulatory Visit | Attending: Thoracic Surgery (Cardiothoracic Vascular Surgery) | Admitting: Thoracic Surgery (Cardiothoracic Vascular Surgery)

## 2021-05-22 ENCOUNTER — Ambulatory Visit (HOSPITAL_COMMUNITY)
Admission: RE | Admit: 2021-05-22 | Discharge: 2021-05-22 | Disposition: A | Discharge status: Home / Self Care | Payer: Managed Care, Other (non HMO) | Source: Ambulatory Visit | Attending: Thoracic Surgery (Cardiothoracic Vascular Surgery) | Admitting: Thoracic Surgery (Cardiothoracic Vascular Surgery)

## 2021-05-22 DIAGNOSIS — J942 Hemothorax: Secondary | ICD-10-CM | POA: Insufficient documentation

## 2021-05-22 DIAGNOSIS — J90 Pleural effusion, not elsewhere classified: Secondary | ICD-10-CM | POA: Insufficient documentation

## 2021-05-22 DIAGNOSIS — Z20822 Contact with and (suspected) exposure to covid-19: Secondary | ICD-10-CM | POA: Insufficient documentation

## 2021-05-22 DIAGNOSIS — Z01812 Encounter for preprocedural laboratory examination: Secondary | ICD-10-CM | POA: Insufficient documentation

## 2021-05-22 HISTORY — DX: Dyspnea, unspecified: R06.00

## 2021-05-22 HISTORY — DX: Gastro-esophageal reflux disease without esophagitis: K21.9

## 2021-05-22 HISTORY — DX: Sleep apnea, unspecified: G47.30

## 2021-05-22 HISTORY — DX: Hypothyroidism, unspecified: E03.9

## 2021-05-22 LAB — PROTIME-INR
INR: 1.2 (ref 0.8–1.2)
Prothrombin Time: 15 seconds (ref 11.4–15.2)

## 2021-05-22 LAB — BLOOD GAS, ARTERIAL
Acid-Base Excess: 3.2 mmol/L — ABNORMAL HIGH (ref 0.0–2.0)
Bicarbonate: 26.7 mmol/L (ref 20.0–28.0)
Drawn by: 58793
FIO2: 21
O2 Saturation: 94 %
Patient temperature: 37
pCO2 arterial: 37.2 mmHg (ref 32.0–48.0)
pH, Arterial: 7.47 — ABNORMAL HIGH (ref 7.350–7.450)
pO2, Arterial: 66.9 mmHg — ABNORMAL LOW (ref 83.0–108.0)

## 2021-05-22 LAB — COMPREHENSIVE METABOLIC PANEL
ALT: 21 U/L (ref 0–44)
AST: 22 U/L (ref 15–41)
Albumin: 2.2 g/dL — ABNORMAL LOW (ref 3.5–5.0)
Alkaline Phosphatase: 410 U/L — ABNORMAL HIGH (ref 38–126)
Anion gap: 11 (ref 5–15)
BUN: 10 mg/dL (ref 6–20)
CO2: 25 mmol/L (ref 22–32)
Calcium: 8.5 mg/dL — ABNORMAL LOW (ref 8.9–10.3)
Chloride: 94 mmol/L — ABNORMAL LOW (ref 98–111)
Creatinine, Ser: 0.77 mg/dL (ref 0.61–1.24)
GFR, Estimated: >60 mL/min (ref >60)
Glucose, Bld: 123 mg/dL — ABNORMAL HIGH (ref 70–99)
Potassium: 4 mmol/L (ref 3.5–5.1)
Sodium: 130 mmol/L — ABNORMAL LOW (ref 135–145)
Total Bilirubin: 0.4 mg/dL (ref 0.3–1.2)
Total Protein: 7.1 g/dL (ref 6.5–8.1)

## 2021-05-22 LAB — URINALYSIS, ROUTINE W REFLEX MICROSCOPIC
Bilirubin Urine: NEGATIVE
Glucose, UA: NEGATIVE mg/dL
Hgb urine dipstick: NEGATIVE
Ketones, ur: NEGATIVE mg/dL
Leukocytes,Ua: NEGATIVE
Nitrite: NEGATIVE
Protein, ur: NEGATIVE mg/dL
Specific Gravity, Urine: 1.018 (ref 1.005–1.030)
pH: 6 (ref 5.0–8.0)

## 2021-05-22 LAB — CBC
HCT: 23.8 % — ABNORMAL LOW (ref 39.0–52.0)
Hemoglobin: 7.8 g/dL — ABNORMAL LOW (ref 13.0–17.0)
MCH: 28.9 pg (ref 26.0–34.0)
MCHC: 32.8 g/dL (ref 30.0–36.0)
MCV: 88.1 fL (ref 80.0–100.0)
Platelets: 900 10*3/uL — ABNORMAL HIGH (ref 150–400)
RBC: 2.7 MIL/uL — ABNORMAL LOW (ref 4.22–5.81)
RDW: 15.7 % — ABNORMAL HIGH (ref 11.5–15.5)
WBC: 15.8 10*3/uL — ABNORMAL HIGH (ref 4.0–10.5)
nRBC: 0 % (ref 0.0–0.2)

## 2021-05-22 LAB — HEMOGLOBIN A1C
Hgb A1c MFr Bld: 7.9 % — ABNORMAL HIGH (ref 4.8–5.6)
Mean Plasma Glucose: 180.03 mg/dL

## 2021-05-22 LAB — SURGICAL PCR SCREEN
MRSA, PCR: NEGATIVE
Staphylococcus aureus: NEGATIVE

## 2021-05-22 LAB — APTT: aPTT: 38 seconds — ABNORMAL HIGH (ref 24–36)

## 2021-05-22 LAB — GLUCOSE, CAPILLARY: Glucose-Capillary: 139 mg/dL — ABNORMAL HIGH (ref 70–99)

## 2021-05-22 NOTE — Progress Notes (Signed)
PCP: Hetty Blend, NP Cardiologist: denies Endocrinologist: Carlus Pavlov, MD  EKG: 04/25/21 CXR: 05/22/21 ECHO: denies Stress Test: denies Cardiac Cath: denies  Fasting Blood Sugar- 120-280 Checks Blood Sugar__6-8_ times a day Wears glucose monitor and has insulin pump.  Contacted diabetes coordinator  OSA/CPAP: Yes, does not wear cpap  ASA/Blood Thinner: No  Covid test 05/22/21 at PAT  Anesthesia Review: No  Patient denies shortness of breath, fever, cough, and chest pain at PAT appointment.  Patient verbalized understanding of instructions provided today at the PAT appointment.  Patient asked to review instructions at home and day of surgery.

## 2021-05-23 LAB — SARS CORONAVIRUS 2 (TAT 6-24 HRS): SARS Coronavirus 2: NEGATIVE

## 2021-05-24 ENCOUNTER — Encounter (HOSPITAL_COMMUNITY)
Admission: RE | Disposition: A | Discharge status: Home / Self Care | Payer: Self-pay | Source: Home / Self Care | Attending: Thoracic Surgery (Cardiothoracic Vascular Surgery)

## 2021-05-24 ENCOUNTER — Other Ambulatory Visit: Payer: Self-pay

## 2021-05-24 ENCOUNTER — Inpatient Hospital Stay (HOSPITAL_COMMUNITY): Payer: Managed Care, Other (non HMO)

## 2021-05-24 ENCOUNTER — Inpatient Hospital Stay (HOSPITAL_COMMUNITY): Payer: Managed Care, Other (non HMO) | Admitting: Certified Registered Nurse Anesthetist

## 2021-05-24 ENCOUNTER — Encounter (HOSPITAL_COMMUNITY): Payer: Self-pay | Admitting: Thoracic Surgery (Cardiothoracic Vascular Surgery)

## 2021-05-24 ENCOUNTER — Inpatient Hospital Stay (HOSPITAL_COMMUNITY)
Admission: RE | Admit: 2021-05-24 | Discharge: 2021-05-27 | DRG: 164 | Disposition: A | Discharge status: Home / Self Care | Payer: Managed Care, Other (non HMO) | Attending: Thoracic Surgery (Cardiothoracic Vascular Surgery) | Admitting: Thoracic Surgery (Cardiothoracic Vascular Surgery)

## 2021-05-24 DIAGNOSIS — D75838 Other thrombocytosis: Secondary | ICD-10-CM | POA: Diagnosis present

## 2021-05-24 DIAGNOSIS — Z9641 Presence of insulin pump (external) (internal): Secondary | ICD-10-CM | POA: Diagnosis present

## 2021-05-24 DIAGNOSIS — Z4682 Encounter for fitting and adjustment of non-vascular catheter: Secondary | ICD-10-CM

## 2021-05-24 DIAGNOSIS — E890 Postprocedural hypothyroidism: Secondary | ICD-10-CM | POA: Diagnosis present

## 2021-05-24 DIAGNOSIS — D62 Acute posthemorrhagic anemia: Secondary | ICD-10-CM | POA: Diagnosis present

## 2021-05-24 DIAGNOSIS — S271XXA Traumatic hemothorax, initial encounter: Principal | ICD-10-CM | POA: Diagnosis present

## 2021-05-24 DIAGNOSIS — E785 Hyperlipidemia, unspecified: Secondary | ICD-10-CM | POA: Diagnosis present

## 2021-05-24 DIAGNOSIS — I10 Essential (primary) hypertension: Secondary | ICD-10-CM | POA: Diagnosis present

## 2021-05-24 DIAGNOSIS — Z8249 Family history of ischemic heart disease and other diseases of the circulatory system: Secondary | ICD-10-CM

## 2021-05-24 DIAGNOSIS — E871 Hypo-osmolality and hyponatremia: Secondary | ICD-10-CM | POA: Diagnosis present

## 2021-05-24 DIAGNOSIS — Z833 Family history of diabetes mellitus: Secondary | ICD-10-CM

## 2021-05-24 DIAGNOSIS — Z20822 Contact with and (suspected) exposure to covid-19: Secondary | ICD-10-CM | POA: Diagnosis present

## 2021-05-24 DIAGNOSIS — F17290 Nicotine dependence, other tobacco product, uncomplicated: Secondary | ICD-10-CM | POA: Diagnosis present

## 2021-05-24 DIAGNOSIS — S2241XA Multiple fractures of ribs, right side, initial encounter for closed fracture: Secondary | ICD-10-CM | POA: Diagnosis present

## 2021-05-24 DIAGNOSIS — Z9689 Presence of other specified functional implants: Secondary | ICD-10-CM

## 2021-05-24 DIAGNOSIS — Z5332 Thoracoscopic surgical procedure converted to open procedure: Secondary | ICD-10-CM

## 2021-05-24 DIAGNOSIS — Z823 Family history of stroke: Secondary | ICD-10-CM | POA: Diagnosis not present

## 2021-05-24 DIAGNOSIS — E109 Type 1 diabetes mellitus without complications: Secondary | ICD-10-CM | POA: Diagnosis present

## 2021-05-24 DIAGNOSIS — Y9352 Activity, horseback riding: Secondary | ICD-10-CM | POA: Diagnosis not present

## 2021-05-24 DIAGNOSIS — J942 Hemothorax: Secondary | ICD-10-CM | POA: Diagnosis not present

## 2021-05-24 DIAGNOSIS — Z09 Encounter for follow-up examination after completed treatment for conditions other than malignant neoplasm: Secondary | ICD-10-CM

## 2021-05-24 HISTORY — PX: VIDEO ASSISTED THORACOSCOPY (VATS)/DECORTICATION: SHX6171

## 2021-05-24 LAB — GLUCOSE, CAPILLARY
Glucose-Capillary: 152 mg/dL — ABNORMAL HIGH (ref 70–99)
Glucose-Capillary: 165 mg/dL — ABNORMAL HIGH (ref 70–99)
Glucose-Capillary: 190 mg/dL — ABNORMAL HIGH (ref 70–99)
Glucose-Capillary: 193 mg/dL — ABNORMAL HIGH (ref 70–99)
Glucose-Capillary: 218 mg/dL — ABNORMAL HIGH (ref 70–99)
Glucose-Capillary: 246 mg/dL — ABNORMAL HIGH (ref 70–99)
Glucose-Capillary: 257 mg/dL — ABNORMAL HIGH (ref 70–99)
Glucose-Capillary: 273 mg/dL — ABNORMAL HIGH (ref 70–99)
Glucose-Capillary: 304 mg/dL — ABNORMAL HIGH (ref 70–99)

## 2021-05-24 LAB — PREPARE RBC (CROSSMATCH)

## 2021-05-24 SURGERY — VIDEO ASSISTED THORACOSCOPY (VATS)/DECORTICATION
Anesthesia: General | Site: Chest | Laterality: Right

## 2021-05-24 MED ORDER — PROPOFOL 10 MG/ML IV BOLUS
INTRAVENOUS | Status: AC
  Filled 2021-05-24: qty 40

## 2021-05-24 MED ORDER — INSULIN REGULAR(HUMAN) IN NACL 100-0.9 UT/100ML-% IV SOLN
INTRAVENOUS | Status: AC
  Administered 2021-05-24: 12 [IU]/h via INTRAVENOUS
  Filled 2021-05-24: qty 100

## 2021-05-24 MED ORDER — HYDROMORPHONE 1 MG/ML IV SOLN
INTRAVENOUS | Status: DC
  Administered 2021-05-24: 30 mg via INTRAVENOUS
  Administered 2021-05-24: 0.9 mg via INTRAVENOUS
  Administered 2021-05-24: 0.6 mg via INTRAVENOUS
  Administered 2021-05-25: 0.9 mg via INTRAVENOUS
  Administered 2021-05-25: 1.2 mg via INTRAVENOUS
  Administered 2021-05-25: 1.5 mg via INTRAVENOUS
  Administered 2021-05-25: 1.8 mg via INTRAVENOUS
  Administered 2021-05-26 (×2): 0.6 mg via INTRAVENOUS
  Administered 2021-05-26: 1.8 mg via INTRAVENOUS
  Administered 2021-05-26: 1.5 mg via INTRAVENOUS
  Administered 2021-05-27: 0.9 mg via INTRAVENOUS
  Administered 2021-05-27: 0.6 mg via INTRAVENOUS

## 2021-05-24 MED ORDER — FENTANYL CITRATE (PF) 250 MCG/5ML IJ SOLN
INTRAMUSCULAR | Status: AC
  Filled 2021-05-24: qty 5

## 2021-05-24 MED ORDER — CHLORHEXIDINE GLUCONATE CLOTH 2 % EX PADS
6.0000 | MEDICATED_PAD | Freq: Every day | CUTANEOUS | Status: DC
  Administered 2021-05-24: 6 via TOPICAL

## 2021-05-24 MED ORDER — OXYCODONE HCL 5 MG PO TABS
5.0000 mg | ORAL_TABLET | Freq: Once | ORAL | Status: DC | PRN
Start: 2021-05-24 — End: 2021-05-24

## 2021-05-24 MED ORDER — LEVOTHYROXINE SODIUM 100 MCG PO TABS: 200.0000 ug | ORAL_TABLET | Freq: Every day | ORAL | Status: DC

## 2021-05-24 MED ORDER — FENTANYL CITRATE (PF) 250 MCG/5ML IJ SOLN
INTRAMUSCULAR | Status: DC | PRN
  Administered 2021-05-24 (×2): 50 ug via INTRAVENOUS
  Administered 2021-05-24: 150 ug via INTRAVENOUS
  Administered 2021-05-24: 100 ug via INTRAVENOUS
  Administered 2021-05-24 (×2): 50 ug via INTRAVENOUS

## 2021-05-24 MED ORDER — 0.9 % SODIUM CHLORIDE (POUR BTL) OPTIME
TOPICAL | Status: DC | PRN
  Administered 2021-05-24: 3000 mL
  Administered 2021-05-24: 1000 mL

## 2021-05-24 MED ORDER — SUGAMMADEX SODIUM 200 MG/2ML IV SOLN
INTRAVENOUS | Status: DC | PRN
  Administered 2021-05-24: 200 mg via INTRAVENOUS

## 2021-05-24 MED ORDER — SUCCINYLCHOLINE CHLORIDE 200 MG/10ML IV SOSY
PREFILLED_SYRINGE | INTRAVENOUS | Status: AC
  Filled 2021-05-24: qty 10

## 2021-05-24 MED ORDER — DEXAMETHASONE SODIUM PHOSPHATE 10 MG/ML IJ SOLN
INTRAMUSCULAR | Status: DC | PRN
  Administered 2021-05-24: 4 mg via INTRAVENOUS

## 2021-05-24 MED ORDER — SODIUM CHLORIDE 0.9% IV SOLUTION: Freq: Once | INTRAVENOUS | Status: DC

## 2021-05-24 MED ORDER — LIDOCAINE 2% (20 MG/ML) 5 ML SYRINGE
INTRAMUSCULAR | Status: AC
  Filled 2021-05-24: qty 5

## 2021-05-24 MED ORDER — BUPIVACAINE HCL (PF) 0.5 % IJ SOLN
INTRAMUSCULAR | Status: AC
  Filled 2021-05-24: qty 30

## 2021-05-24 MED ORDER — ROCURONIUM BROMIDE 10 MG/ML (PF) SYRINGE
PREFILLED_SYRINGE | INTRAVENOUS | Status: AC
  Filled 2021-05-24: qty 10

## 2021-05-24 MED ORDER — EPHEDRINE SULFATE-NACL 50-0.9 MG/10ML-% IV SOSY
PREFILLED_SYRINGE | INTRAVENOUS | Status: DC | PRN
  Administered 2021-05-24: 10 mg via INTRAVENOUS
  Administered 2021-05-24 (×3): 5 mg via INTRAVENOUS

## 2021-05-24 MED ORDER — METOCLOPRAMIDE HCL 5 MG/ML IJ SOLN
10.0000 mg | Freq: Four times a day (QID) | INTRAMUSCULAR | Status: AC
  Administered 2021-05-24 – 2021-05-25 (×4): 10 mg via INTRAVENOUS
  Filled 2021-05-24 (×4): qty 2

## 2021-05-24 MED ORDER — ACETAMINOPHEN 160 MG/5ML PO SOLN: 1000.0000 mg | Freq: Four times a day (QID) | ORAL | Status: DC

## 2021-05-24 MED ORDER — MIDAZOLAM HCL 2 MG/2ML IJ SOLN
INTRAMUSCULAR | Status: AC
  Filled 2021-05-24: qty 2

## 2021-05-24 MED ORDER — INSULIN PUMP
SUBCUTANEOUS | Status: DC
  Administered 2021-05-24: 9 via SUBCUTANEOUS
  Administered 2021-05-26: 5.3 via SUBCUTANEOUS
  Administered 2021-05-27: 5.85 via SUBCUTANEOUS
  Filled 2021-05-24: qty 1

## 2021-05-24 MED ORDER — SODIUM CHLORIDE 0.45 % IV SOLN: INTRAVENOUS | Status: DC

## 2021-05-24 MED ORDER — ONDANSETRON HCL 4 MG/2ML IJ SOLN: 4.0000 mg | Freq: Four times a day (QID) | INTRAMUSCULAR | Status: DC | PRN

## 2021-05-24 MED ORDER — PROPOFOL 10 MG/ML IV BOLUS
INTRAVENOUS | Status: DC | PRN
  Administered 2021-05-24: 70 mg via INTRAVENOUS
  Administered 2021-05-24: 130 mg via INTRAVENOUS

## 2021-05-24 MED ORDER — CHLORHEXIDINE GLUCONATE 0.12 % MT SOLN
15.0000 mL | Freq: Once | OROMUCOSAL | Status: AC
  Administered 2021-05-24: 15 mL via OROMUCOSAL
  Filled 2021-05-24: qty 15

## 2021-05-24 MED ORDER — ONDANSETRON HCL 4 MG/2ML IJ SOLN
INTRAMUSCULAR | Status: DC | PRN
  Administered 2021-05-24: 4 mg via INTRAVENOUS

## 2021-05-24 MED ORDER — KETOROLAC TROMETHAMINE 30 MG/ML IJ SOLN
INTRAMUSCULAR | Status: DC | PRN
  Administered 2021-05-24: 30 mg via INTRAVENOUS

## 2021-05-24 MED ORDER — SENNOSIDES-DOCUSATE SODIUM 8.6-50 MG PO TABS
1.0000 | ORAL_TABLET | Freq: Every day | ORAL | Status: DC
  Administered 2021-05-24 – 2021-05-25 (×2): 1 via ORAL
  Filled 2021-05-24 (×2): qty 1

## 2021-05-24 MED ORDER — METHOCARBAMOL 500 MG PO TABS: 500.0000 mg | ORAL_TABLET | Freq: Four times a day (QID) | ORAL | Status: DC | PRN

## 2021-05-24 MED ORDER — CEFAZOLIN SODIUM-DEXTROSE 2-4 GM/100ML-% IV SOLN
2.0000 g | INTRAVENOUS | Status: AC
  Administered 2021-05-24 (×2): 2 g via INTRAVENOUS
  Filled 2021-05-24: qty 100

## 2021-05-24 MED ORDER — LIDOCAINE HCL (CARDIAC) PF 100 MG/5ML IV SOSY
PREFILLED_SYRINGE | INTRAVENOUS | Status: DC | PRN
  Administered 2021-05-24: 80 mg via INTRATRACHEAL

## 2021-05-24 MED ORDER — ACETAMINOPHEN 500 MG PO TABS: 1000.0000 mg | ORAL_TABLET | Freq: Once | ORAL | Status: DC | PRN

## 2021-05-24 MED ORDER — SODIUM CHLORIDE 0.9 % IV SOLN: INTRAVENOUS | Status: DC | PRN

## 2021-05-24 MED ORDER — INSULIN ASPART 100 UNIT/ML IJ SOLN: 0.0000 [IU] | INTRAMUSCULAR | Status: DC

## 2021-05-24 MED ORDER — ACETAMINOPHEN 10 MG/ML IV SOLN: 1000.0000 mg | Freq: Once | INTRAVENOUS | Status: DC | PRN

## 2021-05-24 MED ORDER — DIPHENHYDRAMINE HCL 50 MG/ML IJ SOLN: 12.5000 mg | Freq: Four times a day (QID) | INTRAMUSCULAR | Status: DC | PRN

## 2021-05-24 MED ORDER — ALBUMIN HUMAN 5 % IV SOLN: INTRAVENOUS | Status: DC | PRN

## 2021-05-24 MED ORDER — HYDROMORPHONE 1 MG/ML IV SOLN
INTRAVENOUS | Status: AC
  Filled 2021-05-24: qty 30

## 2021-05-24 MED ORDER — TRAMADOL HCL 50 MG PO TABS: 50.0000 mg | ORAL_TABLET | Freq: Four times a day (QID) | ORAL | Status: DC | PRN

## 2021-05-24 MED ORDER — FENTANYL CITRATE (PF) 100 MCG/2ML IJ SOLN: 25.0000 ug | INTRAMUSCULAR | Status: DC | PRN

## 2021-05-24 MED ORDER — LEVOTHYROXINE SODIUM 25 MCG PO TABS
25.0000 ug | ORAL_TABLET | Freq: Every day | ORAL | Status: DC
  Administered 2021-05-25 – 2021-05-27 (×3): 25 ug via ORAL
  Filled 2021-05-24 (×3): qty 1

## 2021-05-24 MED ORDER — LEVOTHYROXINE SODIUM 25 MCG PO TABS: 25.0000 ug | ORAL_TABLET | Freq: Every day | ORAL | Status: DC

## 2021-05-24 MED ORDER — ONDANSETRON HCL 4 MG/2ML IJ SOLN
INTRAMUSCULAR | Status: AC
  Filled 2021-05-24: qty 2

## 2021-05-24 MED ORDER — IPRATROPIUM-ALBUTEROL 0.5-2.5 (3) MG/3ML IN SOLN: 3.0000 mL | RESPIRATORY_TRACT | Status: DC | PRN

## 2021-05-24 MED ORDER — DEXAMETHASONE SODIUM PHOSPHATE 10 MG/ML IJ SOLN
INTRAMUSCULAR | Status: AC
  Filled 2021-05-24: qty 1

## 2021-05-24 MED ORDER — KETOROLAC TROMETHAMINE 30 MG/ML IJ SOLN
INTRAMUSCULAR | Status: AC
  Filled 2021-05-24: qty 1

## 2021-05-24 MED ORDER — SODIUM CHLORIDE 0.9 % IV SOLN
INTRAVENOUS | Status: DC | PRN
Start: 2021-05-24 — End: 2021-05-24

## 2021-05-24 MED ORDER — PHENYLEPHRINE HCL-NACL 20-0.9 MG/250ML-% IV SOLN
INTRAVENOUS | Status: DC | PRN
  Administered 2021-05-24: 25 ug/min via INTRAVENOUS

## 2021-05-24 MED ORDER — OXYCODONE HCL 5 MG PO TABS: 5.0000 mg | ORAL_TABLET | ORAL | Status: DC | PRN

## 2021-05-24 MED ORDER — DEXMEDETOMIDINE (PRECEDEX) IN NS 20 MCG/5ML (4 MCG/ML) IV SYRINGE
PREFILLED_SYRINGE | INTRAVENOUS | Status: DC | PRN
  Administered 2021-05-24: 20 ug via INTRAVENOUS

## 2021-05-24 MED ORDER — ORAL CARE MOUTH RINSE: 15.0000 mL | Freq: Once | OROMUCOSAL | Status: AC

## 2021-05-24 MED ORDER — ROCURONIUM BROMIDE 10 MG/ML (PF) SYRINGE
PREFILLED_SYRINGE | INTRAVENOUS | Status: DC | PRN
  Administered 2021-05-24 (×3): 20 mg via INTRAVENOUS
  Administered 2021-05-24: 80 mg via INTRAVENOUS

## 2021-05-24 MED ORDER — CEFAZOLIN SODIUM-DEXTROSE 2-4 GM/100ML-% IV SOLN
2.0000 g | Freq: Three times a day (TID) | INTRAVENOUS | Status: AC
  Administered 2021-05-24 – 2021-05-25 (×2): 2 g via INTRAVENOUS
  Filled 2021-05-24 (×2): qty 100

## 2021-05-24 MED ORDER — IPRATROPIUM-ALBUTEROL 0.5-2.5 (3) MG/3ML IN SOLN
3.0000 mL | Freq: Four times a day (QID) | RESPIRATORY_TRACT | Status: DC
  Administered 2021-05-24: 3 mL via RESPIRATORY_TRACT
  Filled 2021-05-24: qty 3

## 2021-05-24 MED ORDER — ATORVASTATIN CALCIUM 10 MG PO TABS
20.0000 mg | ORAL_TABLET | Freq: Every day | ORAL | Status: DC
  Administered 2021-05-24 – 2021-05-27 (×4): 20 mg via ORAL
  Filled 2021-05-24 (×4): qty 2

## 2021-05-24 MED ORDER — SODIUM CHLORIDE 0.9% FLUSH: 9.0000 mL | INTRAVENOUS | Status: DC | PRN

## 2021-05-24 MED ORDER — NALOXONE HCL 0.4 MG/ML IJ SOLN: 0.4000 mg | INTRAMUSCULAR | Status: DC | PRN

## 2021-05-24 MED ORDER — ACETAMINOPHEN 500 MG PO TABS
1000.0000 mg | ORAL_TABLET | Freq: Four times a day (QID) | ORAL | Status: DC
  Administered 2021-05-24 – 2021-05-27 (×8): 1000 mg via ORAL
  Filled 2021-05-24 (×9): qty 2

## 2021-05-24 MED ORDER — EPHEDRINE 5 MG/ML INJ
INTRAVENOUS | Status: AC
  Filled 2021-05-24: qty 5

## 2021-05-24 MED ORDER — HEPARIN SODIUM (PORCINE) 1000 UNIT/ML IJ SOLN
INTRAMUSCULAR | Status: AC
  Filled 2021-05-24: qty 2

## 2021-05-24 MED ORDER — ACETAMINOPHEN 160 MG/5ML PO SOLN: 1000.0000 mg | Freq: Once | ORAL | Status: DC | PRN

## 2021-05-24 MED ORDER — DIPHENHYDRAMINE HCL 12.5 MG/5ML PO ELIX: 12.5000 mg | ORAL_SOLUTION | Freq: Four times a day (QID) | ORAL | Status: DC | PRN

## 2021-05-24 MED ORDER — LACTATED RINGERS IV SOLN: INTRAVENOUS | Status: DC | PRN

## 2021-05-24 MED ORDER — CEFAZOLIN SODIUM 1 G IJ SOLR
INTRAMUSCULAR | Status: AC
  Filled 2021-05-24: qty 20

## 2021-05-24 MED ORDER — LACTATED RINGERS IV SOLN: INTRAVENOUS | Status: DC

## 2021-05-24 MED ORDER — PHENYLEPHRINE 40 MCG/ML (10ML) SYRINGE FOR IV PUSH (FOR BLOOD PRESSURE SUPPORT)
PREFILLED_SYRINGE | INTRAVENOUS | Status: AC
  Filled 2021-05-24: qty 10

## 2021-05-24 MED ORDER — BUPIVACAINE LIPOSOME 1.3 % IJ SUSP
INTRAMUSCULAR | Status: AC
  Filled 2021-05-24: qty 20

## 2021-05-24 MED ORDER — OXYCODONE HCL 5 MG/5ML PO SOLN: 5.0000 mg | Freq: Once | ORAL | Status: DC | PRN

## 2021-05-24 MED ORDER — LEVOTHYROXINE SODIUM 100 MCG PO TABS
200.0000 ug | ORAL_TABLET | Freq: Every day | ORAL | Status: DC
  Administered 2021-05-25 – 2021-05-27 (×3): 200 ug via ORAL
  Filled 2021-05-24 (×3): qty 2

## 2021-05-24 MED ORDER — MIDAZOLAM HCL 5 MG/5ML IJ SOLN
INTRAMUSCULAR | Status: DC | PRN
  Administered 2021-05-24: 2 mg via INTRAVENOUS

## 2021-05-24 MED ORDER — SODIUM CHLORIDE FLUSH 0.9 % IV SOLN: INTRAVENOUS | Status: DC | PRN

## 2021-05-24 MED ORDER — BISACODYL 5 MG PO TBEC
10.0000 mg | DELAYED_RELEASE_TABLET | Freq: Every day | ORAL | Status: DC
  Administered 2021-05-25 – 2021-05-27 (×3): 10 mg via ORAL
  Filled 2021-05-24 (×3): qty 2

## 2021-05-24 SURGICAL SUPPLY — 89 items
ADH SKN CLS APL DERMABOND .7 (GAUZE/BANDAGES/DRESSINGS) ×1
APL SRG 22X2 LUM MLBL SLNT (VASCULAR PRODUCTS)
APPLICATOR TIP EXT COSEAL (VASCULAR PRODUCTS) IMPLANT
BAG SPEC RTRVL LRG 6X4 10 (ENDOMECHANICALS)
BLADE CLIPPER SURG (BLADE) ×2 IMPLANT
CANISTER SUCT 3000ML PPV (MISCELLANEOUS) ×2 IMPLANT
CATH THORACIC 28FR (CATHETERS) IMPLANT
CATH THORACIC 28FR RT ANG (CATHETERS) IMPLANT
CATH THORACIC 36FR (CATHETERS) IMPLANT
CATH THORACIC 36FR RT ANG (CATHETERS) IMPLANT
CLIP VESOCCLUDE MED 6/CT (CLIP) ×2 IMPLANT
CNTNR URN SCR LID CUP LEK RST (MISCELLANEOUS) ×2 IMPLANT
CONN ST 1/4X3/8  BEN (MISCELLANEOUS) ×4
CONN ST 1/4X3/8 BEN (MISCELLANEOUS) IMPLANT
CONN Y 3/8X3/8X3/8  BEN (MISCELLANEOUS) ×2
CONN Y 3/8X3/8X3/8 BEN (MISCELLANEOUS) IMPLANT
CONT SPEC 4OZ STRL OR WHT (MISCELLANEOUS) ×6
COVER SURGICAL LIGHT HANDLE (MISCELLANEOUS) ×2 IMPLANT
DERMABOND ADVANCED (GAUZE/BANDAGES/DRESSINGS) ×1
DERMABOND ADVANCED .7 DNX12 (GAUZE/BANDAGES/DRESSINGS) IMPLANT
DRAIN CHANNEL 28F RND 3/8 FF (WOUND CARE) IMPLANT
DRAIN CHANNEL 32F RND 10.7 FF (WOUND CARE) ×2 IMPLANT
DRAPE CV SPLIT W-CLR ANES SCRN (DRAPES) ×2 IMPLANT
DRAPE ORTHO SPLIT 77X108 STRL (DRAPES) ×2
DRAPE SURG ORHT 6 SPLT 77X108 (DRAPES) ×1 IMPLANT
DRAPE WARM FLUID 44X44 (DRAPES) ×1 IMPLANT
ELECT BLADE 6.5 EXT (BLADE) ×2 IMPLANT
ELECT REM PT RETURN 9FT ADLT (ELECTROSURGICAL) ×2
ELECTRODE REM PT RTRN 9FT ADLT (ELECTROSURGICAL) ×1 IMPLANT
GAUZE SPONGE 4X4 12PLY STRL (GAUZE/BANDAGES/DRESSINGS) ×2 IMPLANT
GLOVE SURG SIGNA 7.5 PF LTX (GLOVE) ×4 IMPLANT
GOWN STRL REUS W/ TWL LRG LVL3 (GOWN DISPOSABLE) ×2 IMPLANT
GOWN STRL REUS W/ TWL XL LVL3 (GOWN DISPOSABLE) ×1 IMPLANT
GOWN STRL REUS W/TWL LRG LVL3 (GOWN DISPOSABLE) ×4
GOWN STRL REUS W/TWL XL LVL3 (GOWN DISPOSABLE) ×2
HANDLE STAPLE ENDO GIA SHORT (STAPLE)
HEMOSTAT SURGICEL 2X14 (HEMOSTASIS) IMPLANT
KIT BASIN OR (CUSTOM PROCEDURE TRAY) ×2 IMPLANT
KIT TURNOVER KIT B (KITS) ×2 IMPLANT
NDL HYPO 25GX1X1/2 BEV (NEEDLE) ×1 IMPLANT
NDL SPNL 18GX3.5 QUINCKE PK (NEEDLE) IMPLANT
NDL SPNL 22GX3.5 QUINCKE BK (NEEDLE) ×1 IMPLANT
NEEDLE HYPO 25GX1X1/2 BEV (NEEDLE) ×2 IMPLANT
NEEDLE SPNL 18GX3.5 QUINCKE PK (NEEDLE) IMPLANT
NEEDLE SPNL 22GX3.5 QUINCKE BK (NEEDLE) ×2 IMPLANT
NS IRRIG 1000ML POUR BTL (IV SOLUTION) ×7 IMPLANT
PACK CHEST (CUSTOM PROCEDURE TRAY) ×2 IMPLANT
PAD ARMBOARD 7.5X6 YLW CONV (MISCELLANEOUS) ×4 IMPLANT
POUCH ENDO CATCH II 15MM (MISCELLANEOUS) IMPLANT
POUCH SPECIMEN RETRIEVAL 10MM (ENDOMECHANICALS) IMPLANT
SCISSORS LAP 5X35 DISP (ENDOMECHANICALS) IMPLANT
SEALANT PROGEL (MISCELLANEOUS) IMPLANT
SEALANT SURG COSEAL 4ML (VASCULAR PRODUCTS) IMPLANT
SEALANT SURG COSEAL 8ML (VASCULAR PRODUCTS) IMPLANT
SOL ANTI FOG 6CC (MISCELLANEOUS) ×1 IMPLANT
SOLUTION ANTI FOG 6CC (MISCELLANEOUS) ×1
SPONGE INTESTINAL PEANUT (DISPOSABLE) ×5 IMPLANT
SPONGE T-LAP 18X18 ~~LOC~~+RFID (SPONGE) ×4 IMPLANT
SPONGE TONSIL TAPE 1 RFD (DISPOSABLE) ×2 IMPLANT
STAPLER ENDO GIA 12 SHRT THIN (STAPLE) IMPLANT
STAPLER ENDO GIA 12MM SHORT (STAPLE) IMPLANT
SUT PROLENE 4 0 RB 1 (SUTURE)
SUT PROLENE 4-0 RB1 .5 CRCL 36 (SUTURE) IMPLANT
SUT SILK  1 MH (SUTURE) ×4
SUT SILK 1 MH (SUTURE) ×2 IMPLANT
SUT SILK 1 TIES 10X30 (SUTURE) ×2 IMPLANT
SUT SILK 2 0SH CR/8 30 (SUTURE) IMPLANT
SUT SILK 3 0SH CR/8 30 (SUTURE) IMPLANT
SUT VIC AB 1 CTX 36 (SUTURE) ×2
SUT VIC AB 1 CTX36XBRD ANBCTR (SUTURE) IMPLANT
SUT VIC AB 2-0 CTX 36 (SUTURE) ×1 IMPLANT
SUT VIC AB 2-0 UR6 27 (SUTURE) ×1 IMPLANT
SUT VIC AB 3-0 MH 27 (SUTURE) IMPLANT
SUT VIC AB 3-0 X1 27 (SUTURE) ×1 IMPLANT
SUT VICRYL 2 TP 1 (SUTURE) IMPLANT
SYR 10ML LL (SYRINGE) IMPLANT
SYR 30ML LL (SYRINGE) ×2 IMPLANT
SYSTEM SAHARA CHEST DRAIN ATS (WOUND CARE) ×2 IMPLANT
TAPE CLOTH 4X10 WHT NS (GAUZE/BANDAGES/DRESSINGS) ×2 IMPLANT
TAPE CLOTH SURG 4X10 WHT LF (GAUZE/BANDAGES/DRESSINGS) ×1 IMPLANT
TIP APPLICATOR SPRAY EXTEND 16 (VASCULAR PRODUCTS) IMPLANT
TOWEL GREEN STERILE (TOWEL DISPOSABLE) ×2 IMPLANT
TOWEL GREEN STERILE FF (TOWEL DISPOSABLE) ×2 IMPLANT
TRAP SPECIMEN MUCUS 40CC (MISCELLANEOUS) ×3 IMPLANT
TRAY FOL W/BAG SLVR 16FR STRL (SET/KITS/TRAYS/PACK) IMPLANT
TRAY FOLEY W/BAG SLVR 16FR LF (SET/KITS/TRAYS/PACK) ×2
TROCAR XCEL 12X100 BLDLESS (ENDOMECHANICALS) ×1 IMPLANT
TROCAR XCEL BLADELESS 5X75MML (TROCAR) ×2 IMPLANT
WATER STERILE IRR 1000ML POUR (IV SOLUTION) ×2 IMPLANT

## 2021-05-24 NOTE — Anesthesia Procedure Notes (Signed)
Arterial Line Insertion Start/End8/26/2022 6:50 AM, 05/24/2021 6:55 AM Performed by: Demetrio Lapping, CRNA, CRNA  Patient location: Pre-op. Preanesthetic checklist: patient identified, IV checked, site marked, risks and benefits discussed, surgical consent, monitors and equipment checked, pre-op evaluation, timeout performed and anesthesia consent Lidocaine 1% used for infiltration and patient sedated Right, radial was placed Catheter size: 20 G Hand hygiene performed  and maximum sterile barriers used   Attempts: 1 Procedure performed without using ultrasound guided technique. Following insertion, dressing applied and Biopatch. Post procedure assessment: normal  Patient tolerated the procedure well with no immediate complications.

## 2021-05-24 NOTE — Progress Notes (Signed)
Admitted from PACU awake and alert. On insulin gtt. ,as per  Report from PACU insulin gtt has to be stopped once on the floor and  insulin  pump is restarted. TCTS PA made aware and gave an order for Insulin pump to be restarted. Contract signed by pt ,provided with the flow sheet and claimed that he is aware of this and can do it himself. Wife at bedside  and applied insulin pump to left upper arm. Continue to monitor.

## 2021-05-24 NOTE — Interval H&P Note (Signed)
History and Physical Interval Note:  05/24/2021 7:21 AM  Willie Spencer  has presented today for surgery, with the diagnosis of RIGHT HEMOTHORAX.  The various methods of treatment have been discussed with the patient and family. After consideration of risks, benefits and other options for treatment, the patient has consented to  Procedure(s): VIDEO ASSISTED THORACOSCOPY (VATS)/DRAINAGE OF HEMOTHORAX, possible DECORTICATION (Right) as a surgical intervention.  [name] history has been reviewed, patient examined, no change in status, stable for surgery.  I have reviewed the patient's chart and labs.  Questions were answered to the patient's satisfaction.     Loreli Slot

## 2021-05-24 NOTE — Anesthesia Procedure Notes (Signed)
Procedure Name: Intubation Date/Time: 05/24/2021 7:45 AM Performed by: Lowella Dell, CRNA Pre-anesthesia Checklist: Patient identified, Emergency Drugs available, Suction available and Patient being monitored Patient Re-evaluated:Patient Re-evaluated prior to induction Oxygen Delivery Method: Circle System Utilized Preoxygenation: Pre-oxygenation with 100% oxygen Induction Type: IV induction Ventilation: Oral airway inserted - appropriate to patient size and Two handed mask ventilation required Laryngoscope Size: Mac and 4 Grade View: Grade II Endobronchial tube: Left, EBT position confirmed by auscultation, EBT position confirmed by fiberoptic bronchoscope and Double lumen EBT and 41 Fr Number of attempts: 2 (1st attempt=esophageal intubation. Immediately recognized and EBT withdrawn.) Airway Equipment and Method: Stylet Placement Confirmation: ETT inserted through vocal cords under direct vision, positive ETCO2 and breath sounds checked- equal and bilateral Secured at: 30 cm Tube secured with: Tape Dental Injury: Teeth and Oropharynx as per pre-operative assessment

## 2021-05-24 NOTE — Discharge Summary (Signed)
Physician Discharge Summary  Patient ID: [name] MRN: [id number] DOB/AGE: 12-02-1965 55 y.o.  Admit date: 05/24/2021 Discharge date: 05/27/2021  Admission Diagnoses:  Patient Active Problem List   Diagnosis Date Noted   Hemothorax 05/24/2021   Hemothorax on right 04/23/2021   Hyperlipidemia 10/22/2017   HTN (hypertension) 01/29/2016   Obesity 10/24/2015   Chronic pain in right shoulder 07/26/2015   Type 1 diabetes mellitus with hyperglycemia (HCC) 07/09/2015   Postablative hypothyroidism 07/09/2015   Smoker 07/09/2015   Discharge Diagnoses:   Patient Active Problem List   Diagnosis Date Noted   Hemothorax 05/24/2021   S/P VATS with drainage of fibrothorax due to hemothorax with decortication 05/24/2021   Hemothorax on right 04/23/2021   Hyperlipidemia 10/22/2017   HTN (hypertension) 01/29/2016   Obesity 10/24/2015   Chronic pain in right shoulder 07/26/2015   Type 1 diabetes mellitus with hyperglycemia (HCC) 07/09/2015   Postablative hypothyroidism 07/09/2015   Smoker 07/09/2015   Discharged Condition: stable  History of Present Illness:  Willie Spencer is a 55 year old man with a past history significant for type 1 diabetes, post ablative hypothyroidism, hypertension, hyperlipidemia, and tobacco abuse.  He was in his usual state of health until about a month ago when he was thrown off a horse.  He landed on his back and was having pain in his chest and also noted extensive bruising of his abdomen and flank.  He did not have any loss of consciousness.  He tried to tough it out for several days and then was brought to the emergency room by EMS after a near syncopal event at work.  He was found to have multiple right-sided rib fractures and a hemothorax.  A chest tube was placed.  That was later removed and he was discharged on 04/28/2021.  He presented back for a follow-up with Dr. Dorris Fetch and was found to have a large right pleural effusion.  This was new from discharge.   He was sent for thoracentesis on 05/14/2021.  Only a small amount of maroon fluid was evacuated and there was no change in the effusion.  He was felt to have a clotted hemothorax.  He still has pain in the right chest.  He has been taking oxycodone for that.  He is usually only taking it about once a day.  He has had some shortness of breath.  It was felt the patient would require VATS procedure for complete drainage.  The risks and benefits of the procedure were explained to the patient and he was agreeable to proceed.  Hospital Course:  Willie Spencer presented to [facility].  He was taken to the operating room and underwent Right Video Assisted Thoracoscopy with Drainage of Hem/fibrothorax and Decortication. He tolerated the procedure without difficulty and was taken to the PACU in stable condition.  His insulin pump was restarted post operatively and glucose control was adequate.  Follow-up chest x-ray showed good expansion of the right lung with persistent pleural thickening as expected.  He did not have a significant pneumothorax and no air leak.  Chest tube drainage gradually subsided.  The anterior chest tube was removed on 05/26/2021 and the remaining tube was removed on the following day.  Follow-up chest x-ray was stable.  His respiratory status was improved and he was maintaining good oxygenation on room air at the time of his discharge.  Following the procedure, he overall made a progressive and satisfactory recovery.  At the time of discharge, he was ambulating independently  and tolerating a diet without any difficulty.  Pain control was satisfactory on oral analgesics.  Consults: None  Significant Diagnostic Studies: CT Scan  IMPRESSION: 1. Moderate-large right-sided hemopneumothorax. No evidence of tension component. 2. Multiple acute right-sided rib fractures involving the right third through seventh ribs as well as the right tenth rib. The right sixth, seventh, and tenth rib  fractures are moderately displaced. 3. No evidence of acute traumatic injury within the abdomen or pelvis. 4. Multiple prominent retroperitoneal and mesenteric lymph nodes are similar in size, number, and distribution from prior CT of 2010 and favored benign.  Treatments:   Operative Report    DATE OF PROCEDURE: 05/24/2021   PREOPERATIVE DIAGNOSIS:  Right hemothorax.   POSTOPERATIVE DIAGNOSIS:  Right fibrothorax.   PROCEDURE:  Right video-assisted thoracoscopy, drainage of hemothorax and decortication of the lung.   SURGEON:  Charlett Lango, MD   ASSISTANT:  Lowella Dandy, MD   ANESTHESIA:  General.   FINDINGS:  Multiloculated hemothorax with approximately 2 liters of fluid and debris, dense fibrothorax encasing the right lower and middle lobes.  Good reexpansion of both post-decortication.   CLINICAL NOTE:  The patient is a 55 year old gentleman recently injured after being thrown from a horse. He suffered rib fractures and a hemopneumothorax.  He had been treated with a chest tube and he was discharged. Couple weeks later, he returned with  a large right pleural effusion.  Thoracentesis drained only a small amount of bloody fluid and he was referred for VATS for decortication.  The indications, risks, benefits, and alternatives were discussed in detail with the patient.  He understood and  accepted the risks and agreed to proceed.   Discharge Exam: Blood pressure 129/80, pulse 64, temperature 97.7 F (36.5 C), temperature source Oral, resp. rate 19, height 6\' 3"  (1.905 m), weight 105.2 kg, SpO2 97 %.  General appearance: alert, cooperative, and no distress Neurologic: intact Heart: regular rate and rhythm Lungs: clear to auscultation bilaterally Wounds: the right thoracotomy incision is well approximated and dry.  The chest tube site dressing is dry.  The CXR post chest tube removal is stable.   Disposition:    Allergies as of 05/27/2021       Reactions    Wellbutrin [bupropion] Swelling, Other (See Comments)   Gum swelling   Latex Rash   Levemir [insulin Detemir] Rash        Medication List     STOP taking these medications    acetaminophen 500 MG tablet Commonly known as: TYLENOL   oxyCODONE 5 MG immediate release tablet Commonly known as: Oxy IR/ROXICODONE       TAKE these medications    atorvastatin 20 MG tablet Commonly known as: LIPITOR Take 1 tablet (20 mg total) by mouth daily.   EX-LAX PO Take 1 tablet by mouth daily as needed (constipation).   FreeStyle Libre 2 Reader Maud 1 each by Does not apply route daily.   FreeStyle Libre 2 Sensor Misc APPLY ONE SENSOR EVERY 14 DAYS TO CHECK GLUCOSE LEVELS   FREESTYLE TEST STRIPS test strip Generic drug: glucose blood Use as instructed to test blood sugar up to 4 times daily.   glucagon 1 MG Solr injection Commonly known as: GLUCAGEN Inject 1 mg into the muscle once as needed for up to 1 dose for low blood sugar.   Glucagon 3 MG/DOSE Powd Place 3 mg into the nose once as needed for up to 1 dose.   ibuprofen 200 MG tablet Commonly known  as: ADVIL Take 400-600 mg by mouth every 6 (six) hours as needed for moderate pain.   Insulin Pen Needle 32G X 6 MM Misc To use with insulin pen.   levothyroxine 200 MCG tablet Commonly known as: SYNTHROID Take 1 tablet (200 mcg total) by mouth daily before breakfast. Take with 25 mcg dose to equal 225 mcg daily   levothyroxine 25 MCG tablet Commonly known as: SYNTHROID Take 1 tablet (25 mcg total) by mouth daily before breakfast. Take with 200 mcg dose to equal 225 mcg daily   Lyumjev 100 UNIT/ML Soln Generic drug: Insulin Lispro-aabc Inject into the skin. Use in omnipod, averages about 100 units every 3 days   methocarbamol 500 MG tablet Commonly known as: ROBAXIN Take 1 tablet (500 mg total) by mouth 4 (four) times daily. What changed:  when to take this reasons to take this   multivitamin with minerals Tabs  tablet Take 1 tablet by mouth daily.   naproxen sodium 220 MG tablet Commonly known as: ALEVE Take 440 mg by mouth 2 (two) times daily as needed (pain).   NovoLOG FlexPen 100 UNIT/ML FlexPen Generic drug: insulin aspart USE TO INJECT 10-15 UNITS 3 TIMES A DAY. What changed: See the new instructions.   Omnipod Classic Pods (Gen 3) Misc Use as instructed change pod every 1.5 days What changed:  how much to take how to take this when to take this additional instructions   oxyCODONE-acetaminophen 7.5-325 MG tablet Commonly known as: Percocet Take 1 tablet by mouth every 4 (four) hours as needed for up to 7 days for severe pain.        Follow-up Information     Loreli Slot, MD. Go on 06/25/2021.   Specialty: Cardiothoracic Surgery Why: Your appointment with Dr.Hendrickson is at 3:30pm.  Please get chest x-ray 30 min prior to your appointment.  [facility] is located on first floor of our office building Contact information: [address] [place] [address] [phone]                 Signed: Leary Roca, PA-C  05/27/2021, 1:53 PM

## 2021-05-24 NOTE — Progress Notes (Signed)
MD Maple Hudson notified about pt's blood sugar (218) upon arrival to short stay. MD also notified about pt's hgb of 7.8 and platelets of 900; pt also wearing omnipod insulin pump, per MD Maple Hudson pt needs to remove due to positioning in surgery.

## 2021-05-24 NOTE — Brief Op Note (Signed)
05/24/2021  11:38 AM  PATIENT:  Willie Spencer  55 y.o. male  PRE-OPERATIVE DIAGNOSIS:  RIGHT HEMOTHORAX  POST-OPERATIVE DIAGNOSIS: FIBROTHORAX as a result  RIGHT HEMOTHORAX  PROCEDURE:  Procedure(s):  VIDEO ASSISTED THORACOSCOPY  DRAINAGE OF HEMOTHORAX/FIBROTHORAX  DECORTICATION (Right)  SURGEON:  Surgeon(s) and Role:    * Loreli Slot, MD - Primary  PHYSICIAN ASSISTANT: Atharv Barriere PA-C  ASSISTANTS: none   ANESTHESIA:   general  EBL:  1700 mL   BLOOD ADMINISTERED: 1 unit CC PRBC  DRAINS:  32 Blake Drain x 2    LOCAL MEDICATIONS USED:  NONE  SPECIMEN:  Source of Specimen:  Pleural Fluid, Pleural Peel, Visceral Peel  DISPOSITION OF SPECIMEN:   Microbiology, Pathology  COUNTS:  YES  TOURNIQUET:  * No tourniquets in log *  DICTATION: .Dragon Dictation  PLAN OF CARE: Admit to inpatient   PATIENT DISPOSITION:  PACU - hemodynamically stable.   Delay start of Pharmacological VTE agent (>24hrs) due to surgical blood loss or risk of bleeding: yes

## 2021-05-24 NOTE — Progress Notes (Signed)
Inpatient Diabetes Program Recommendations  AACE/ADA: New Consensus Statement on Inpatient Glycemic Control (2015)  Target Ranges:  Prepandial:   less than 140 mg/dL      Peak postprandial:   less than 180 mg/dL (1-2 hours)      Critically ill patients:  140 - 180 mg/dL   Lab Results  Component Value Date   GLUCAP 193 (H) 05/24/2021   HGBA1C 7.9 (H) 05/22/2021    Review of Glycemic Control  Diabetes history: DM1 Outpatient Diabetes medications: OmniPod insulin pump with Novolog Current orders for Inpatient glycemic control: IV insulin per [facility]  Endo - Dr Elvera Lennox HgbA1C - 7.9%  Insulin pump settings per EMR  Basal: 12 am: 1.8 units/hr 3 am:  1.7 units/hr 5 am: 1.5 units/hr 9 am: 1.3 units/hr 1 pm: 1.4 units/hr Total basal insulin: 35.4 units/day ICR: 12 am: 5 grams (1 unit covers 5 grams of carbohydrates) 5 pm: 4 grams (1 unit covers 4 grams of carbohydrates) target: 100-100 mg/dl ISF: 35 mg/dl (1 unit drops glucose 35 mg/dl) Insulin on Board: 4 hours   Inpatient Diabetes Program Recommendations:    Restart pump 1-2 hours prior to discontinuation of drip. Pt will need to have all supplies for OmniPod to get pump restarted when ready to transition off drip.  Will ask RN to document on Insulin Pump flowsheet once pt applies new OmniPod.  Our Inpt Diabetes Team will follow.   Thank you. Ailene Ards, RD, LDN, CDE Inpatient Diabetes Coordinator [phone]

## 2021-05-24 NOTE — Anesthesia Preprocedure Evaluation (Signed)
Anesthesia Evaluation  Patient identified by MRN, date of birth, ID band Patient awake    Reviewed: Allergy & Precautions, NPO status , Patient's Chart, lab work & pertinent test results  History of Anesthesia Complications Negative for: history of anesthetic complications  Airway Mallampati: III  TM Distance: >3 FB Neck ROM: Full    Dental  (+) Dental Advisory Given, Teeth Intact   Pulmonary shortness of breath, sleep apnea , Current Smoker and Patient abstained from smoking.,  RIGHT HEMOTHORAX    + decreased breath sounds      Cardiovascular hypertension,  Rhythm:Regular     Neuro/Psych negative neurological ROS  negative psych ROS   GI/Hepatic Neg liver ROS, GERD  Controlled,  Endo/Other  diabetes, Insulin DependentHypothyroidism   Renal/GU Lab Results      Component                Value               Date                      CREATININE               0.77                05/22/2021                Musculoskeletal   Abdominal   Peds  Hematology  (+) Blood dyscrasia, anemia , Lab Results      Component                Value               Date                      WBC                      15.8 (H)            05/22/2021                HGB                      7.8 (L)             05/22/2021                HCT                      23.8 (L)            05/22/2021                MCV                      88.1                05/22/2021                PLT                      900 (H)             05/22/2021              Anesthesia Other Findings   Reproductive/Obstetrics  Anesthesia Physical Anesthesia Plan  ASA: 3  Anesthesia Plan: General   Post-op Pain Management:    Induction: Intravenous  PONV Risk Score and Plan: 1 and Ondansetron and Dexamethasone  Airway Management Planned: Double Lumen EBT  Additional Equipment: Arterial line  Intra-op Plan:    Post-operative Plan: Extubation in OR  Informed Consent: I have reviewed the patients History and Physical, chart, labs and discussed the procedure including the risks, benefits and alternatives for the proposed anesthesia with the patient or authorized representative who has indicated his/her understanding and acceptance.     Dental advisory given  Plan Discussed with: CRNA and Anesthesiologist  Anesthesia Plan Comments:         Anesthesia Quick Evaluation

## 2021-05-24 NOTE — Progress Notes (Signed)
Report given at 1330

## 2021-05-24 NOTE — Transfer of Care (Signed)
Immediate Anesthesia Transfer of Care Note  Patient: Willie Spencer  Procedure(s) Performed: VIDEO ASSISTED THORACOSCOPY (VATS)/DRAINAGE OF HEMOTHORAX, DECORTICATION (Right: Chest)  Patient Location: PACU  Anesthesia Type:General  Level of Consciousness: sedated and patient cooperative  Airway & Oxygen Therapy: Patient Spontanous Breathing and Patient connected to face mask oxygen  Post-op Assessment: Report given to RN and Post -op Vital signs reviewed and stable  Post vital signs: Reviewed and stable  Last Vitals:  Vitals Value Taken Time  BP 99/57 05/24/21 1237  Temp    Pulse 74 05/24/21 1242  Resp 18 05/24/21 1242  SpO2 94 % 05/24/21 1242  Vitals shown include unvalidated device data.  Last Pain:  Vitals:   05/24/21 0610  TempSrc:   PainSc: 2          Complications: No notable events documented.

## 2021-05-25 ENCOUNTER — Inpatient Hospital Stay (HOSPITAL_COMMUNITY): Payer: Managed Care, Other (non HMO)

## 2021-05-25 LAB — CBC
HCT: 21.6 % — ABNORMAL LOW (ref 39.0–52.0)
Hemoglobin: 6.9 g/dL — CL (ref 13.0–17.0)
MCH: 28.2 pg (ref 26.0–34.0)
MCHC: 31.9 g/dL (ref 30.0–36.0)
MCV: 88.2 fL (ref 80.0–100.0)
Platelets: 671 10*3/uL — ABNORMAL HIGH (ref 150–400)
RBC: 2.45 MIL/uL — ABNORMAL LOW (ref 4.22–5.81)
RDW: 15.4 % (ref 11.5–15.5)
WBC: 19.5 10*3/uL — ABNORMAL HIGH (ref 4.0–10.5)
nRBC: 0 % (ref 0.0–0.2)

## 2021-05-25 LAB — GLUCOSE, CAPILLARY
Glucose-Capillary: 239 mg/dL — ABNORMAL HIGH (ref 70–99)
Glucose-Capillary: 208 mg/dL — ABNORMAL HIGH (ref 70–99)
Glucose-Capillary: 181 mg/dL — ABNORMAL HIGH (ref 70–99)
Glucose-Capillary: 128 mg/dL — ABNORMAL HIGH (ref 70–99)
Glucose-Capillary: 141 mg/dL — ABNORMAL HIGH (ref 70–99)

## 2021-05-25 LAB — BASIC METABOLIC PANEL
Anion gap: 6 (ref 5–15)
BUN: 12 mg/dL (ref 6–20)
CO2: 26 mmol/L (ref 22–32)
Calcium: 7.6 mg/dL — ABNORMAL LOW (ref 8.9–10.3)
Chloride: 95 mmol/L — ABNORMAL LOW (ref 98–111)
Creatinine, Ser: 0.77 mg/dL (ref 0.61–1.24)
GFR, Estimated: >60 mL/min (ref >60)
Glucose, Bld: 303 mg/dL — ABNORMAL HIGH (ref 70–99)
Potassium: 4.7 mmol/L (ref 3.5–5.1)
Sodium: 127 mmol/L — ABNORMAL LOW (ref 135–145)

## 2021-05-25 LAB — ACID FAST SMEAR (AFB, MYCOBACTERIA)
Acid Fast Smear: NEGATIVE
Acid Fast Smear: NEGATIVE
Acid Fast Smear: NEGATIVE

## 2021-05-25 LAB — BLOOD GAS, ARTERIAL
Acid-Base Excess: 1.8 mmol/L (ref 0.0–2.0)
Bicarbonate: 25.9 mmol/L (ref 20.0–28.0)
FIO2: 28
O2 Saturation: 95.6 %
Patient temperature: 36.5
pCO2 arterial: 40.4 mmHg (ref 32.0–48.0)
pH, Arterial: 7.42 (ref 7.350–7.450)
pO2, Arterial: 75.8 mmHg — ABNORMAL LOW (ref 83.0–108.0)

## 2021-05-25 LAB — PREPARE RBC (CROSSMATCH)

## 2021-05-25 MED ORDER — SODIUM CHLORIDE 0.9% IV SOLUTION: Freq: Once | INTRAVENOUS | Status: AC

## 2021-05-25 MED ORDER — FE FUMARATE-B12-VIT C-FA-IFC PO CAPS
1.0000 | ORAL_CAPSULE | Freq: Two times a day (BID) | ORAL | Status: DC
  Administered 2021-05-26 – 2021-05-27 (×3): 1 via ORAL
  Filled 2021-05-25 (×5): qty 1

## 2021-05-25 NOTE — Progress Notes (Addendum)
[address]       [place] [address]             [phone]      1 Day Post-Op Procedure(s) (LRB): VIDEO ASSISTED THORACOSCOPY (VATS)/DRAINAGE OF HEMOTHORAX, DECORTICATION (Right) Subjective: Some pain - ok control on PCA  Objective: Vital signs in last 24 hours: Temp:  [97.2 F (36.2 C)-98 F (36.7 C)] 97.8 F (36.6 C) (08/27 1000) Pulse Rate:  [68-81] 73 (08/27 1000) Cardiac Rhythm: Normal sinus rhythm (08/27 0700) Resp:  [13-23] 14 (08/27 1000) BP: (99-120)/(57-71) 120/71 (08/27 1000) SpO2:  [92 %-96 %] 92 % (08/27 0723) Arterial Line BP: (100-103)/(50-53) 100/53 (08/26 1306)  Hemodynamic parameters for last 24 hours:    Intake/Output from previous day: 08/26 0701 - 08/27 0700 In: 3135 [P.O.:200; I.V.:1750; Blood:315; IV Piggyback:850] Out: 4432 [Urine:1850; Blood:2000; Chest Tube:582] Intake/Output this shift: No intake/output data recorded.  General appearance: alert, cooperative, and no distress Heart: regular rate and rhythm Lungs: scattered ronchi Abdomen: benign Extremities: PAS in place Wound: incis healing well  Lab Results: Recent Labs    05/22/21 1600 05/25/21 0045  WBC 15.8* 19.5*  HGB 7.8* 6.9*  HCT 23.8* 21.6*  PLT 900* 671*   BMET:  Recent Labs    05/22/21 1600 05/25/21 0045  NA 130* 127*  K 4.0 4.7  CL 94* 95*  CO2 25 26  GLUCOSE 123* 303*  BUN 10 12  CREATININE 0.77 0.77  CALCIUM 8.5* 7.6*    PT/INR:  Recent Labs    05/22/21 1600  LABPROT 15.0  INR 1.2   ABG    Component Value Date/Time   PHART 7.420 05/25/2021 0459   HCO3 25.9 05/25/2021 0459   TCO2 26 04/23/2021 1203   O2SAT 95.6 05/25/2021 0459   CBG (last 3)  Recent Labs    05/24/21 2346 05/25/21 0414 05/25/21 0728  GLUCAP 304* 239* 208*    Meds Scheduled Meds:  sodium chloride   Intravenous Once   acetaminophen  1,000 mg Oral Q6H   Or   acetaminophen (TYLENOL) oral liquid 160 mg/5 mL  1,000 mg Oral Q6H   atorvastatin  20 mg  Oral Daily   bisacodyl  10 mg Oral Daily   Chlorhexidine Gluconate Cloth  6 each Topical Daily   HYDROmorphone   Intravenous Q4H   insulin pump   Subcutaneous Q4H   levothyroxine  200 mcg Oral Q0600   And   levothyroxine  25 mcg Oral Q0600   metoCLOPramide (REGLAN) injection  10 mg Intravenous Q6H   senna-docusate  1 tablet Oral QHS   Continuous Infusions:  sodium chloride 100 mL/hr at 05/25/21 0050   sodium chloride     PRN Meds:.Place/Maintain arterial line **AND** sodium chloride, diphenhydrAMINE **OR** diphenhydrAMINE, ipratropium-albuterol, methocarbamol, naloxone **AND** sodium chloride flush, ondansetron (ZOFRAN) IV, ondansetron (ZOFRAN) IV, oxyCODONE, traMADol  Xrays DG CHEST PORT 1 VIEW  Result Date: 05/25/2021 CLINICAL DATA:  55 year old male with shortness of breath. History of hemothorax status post VATS with decortication. EXAM: PORTABLE CHEST 1 VIEW COMPARISON:  Prior chest x-ray 05/24/2021 FINDINGS: Right-sided chest tubes remain in place. Stable postsurgical changes of decortication with mild residual pleural thickening. No evidence of pneumothorax. There is associated atelectasis in the right mid lung and base. The left lung remains well aerated. Overall, no significant interval change in the appearance of the chest over the last 24 hours. IMPRESSION: Stable appearance of the chest status post VATS with decortication. Two right-sided chest  tubes remain with associated pleural thickening and atelectasis. No pneumothorax. Electronically Signed   By: Malachy Moan M.D.   On: 05/25/2021 09:31   DG Chest Port 1 View  Result Date: 05/24/2021 CLINICAL DATA:  Postop right-sided thoracoscopy EXAM: PORTABLE CHEST 1 VIEW COMPARISON:  05/22/2021 FINDINGS: Single frontal view of the chest demonstrates interval placement of 2 right-sided chest tubes, with near complete evacuation of the right pleural fluid seen previously. There is trace residual fluid along the right lateral  hemithorax, likely loculated. Improved aeration at the right lung base, with persistent lateral consolidation likely residual atelectasis. Left chest is clear. No evidence of pneumothorax. Cardiac silhouette is unremarkable. IMPRESSION: 1. No complication after right-sided thoracoscopy, with decreased right effusion and placement of right-sided chest tubes as above. 2. Improved aeration of the right lung, with moderate residual atelectasis at the right lung base. Electronically Signed   By: Sharlet Salina M.D.   On: 05/24/2021 15:21    Assessment/Plan: S/P Procedure(s) (LRB): VIDEO ASSISTED THORACOSCOPY (VATS)/DRAINAGE OF HEMOTHORAX, DECORTICATION (Right)  1 afeb, VSS sBP 99-120's, sinus rhythm 2 sats good on RA 3 chest tube 582 cc post op, sero sang, no air leak- continue for now, will place on H2O seal 4 CXR stable without pntx, + pleural thickening and atx 5 expected ABL anemia on chronic - H/H 6.9/21.6- will give one unit of PRBC's 6 leukocytosis trend is increased, prob some is reactive- monitor clinically 7 reactive thrombocytosis- improved trend, currently holding lovenox 8 hyponatremia, will d/c IVF, normal renal fxn, elevated glucose so may be some pseudohyponatremia 9 CBG's elevated - he titrates his own insulin pump and they areimproving     LOS: 1 day    Rowe Clack PA-C Pager [phone] 05/25/2021    Chart reviewed, patient examined, agree with above. Transfusing for Hgb 6.9. CXR looks stable and chest tube output is thin serosanguinous. Needs to start mobilizing.

## 2021-05-25 NOTE — Op Note (Signed)
NAME: Willie Spencer, HANG MEDICAL RECORD NO: [id number] ACCOUNT NO: [id number] DATE OF BIRTH: 07/10/1966 FACILITY: [facility] LOCATION: [facility] PHYSICIAN: Salvatore Decent. Dorris Fetch, MD  Operative Report   DATE OF PROCEDURE: 05/24/2021  PREOPERATIVE DIAGNOSIS:  Right hemothorax.  POSTOPERATIVE DIAGNOSIS:  Right fibrothorax.  PROCEDURE:   Right video-assisted thoracoscopy,  Drainage of hemothorax and  Decortication of the lung.  SURGEON:  Charlett Lango, MD  ASSISTANT:  Lowella Dandy, MD  ANESTHESIA:  General.  FINDINGS:  Multiloculated hemothorax with approximately 2 liters of fluid and debris, dense fibrothorax encasing the right lower and middle lobes.  Good reexpansion of both post-decortication.  CLINICAL NOTE:  [name] is a 55 year old gentleman recently injured after being thrown from a horse. He suffered rib fractures and a hemopneumothorax.  He had been treated with a chest tube and he was discharged. A couple of weeks later, he returned with  a large right pleural effusion.  Thoracentesis drained only a small amount of bloody fluid and he was referred for VATS for decortication.  The indications, risks, benefits, and alternatives were discussed in detail with the patient.  He understood and  accepted the risks and agreed to proceed.  DESCRIPTION OF PROCEDURE:  [name] was brought to the operating room on 05/24/2021.  He had induction of general anesthesia and was intubated with a double lumen endotracheal tube.  Intravenous antibiotics were administered.  Foley catheter was placed.   Sequential compression devices were placed on the calves for DVT prophylaxis.  He was placed in a left lateral decubitus position and the right chest was prepped and draped in the usual sterile fashion.  Single lung ventilation of the left lung was  initiated and was tolerated well throughout the procedure.  Incision was made in the fifth interspace anterolaterally, it was carried through the skin and  subcutaneous tissue.  The serratus muscles were separated and the intercostal muscles were divided. A sucker was advanced into the chest and a large amount of old  blood was evacuated. Both blood and tissue were sent for cultures.  A space was created inferiorly and a port was placed and a 10 mm scope was advanced into the chest.  There was an obvious complex loculated hemothorax.  These loculations were broken up  and approximately 2 liters of blood and fluid were removed from the chest.  Inspection of the chest wall revealed extensive fibrinous debris, which was removed.  The lung itself was encased in a dense pleural peel. The lung was mobilized over the  circumference of the lower and middle lobes.  The upper lobe was densely adherent to the chest wall and no dissection was done in that area.  A plane was developed in the pleural peel and the peel was removed.  This was a slow and complex process.  There  was surprisingly minimal tearing of the visceral pleura with the decortication and ultimately there was good reexpansion of both the lower and middle lobes.  Two 32-French Blake drains were placed and secured to the skin with #1 silk sutures.  The chest  was copiously irrigated with warm saline on multiple occasions. The dual lung ventilation was resumed.  There was good reexpansion of the lower and middle lobes.  The working incision was closed in standard fashion.  Chest tubes were placed to a  Pleur-evac on suction.  All sponge, needle and instrument counts were correct at the end of the procedure.  The patient was placed back in the supine position.  He was extubated in the operating room and taken to the postanesthetic care unit in good  condition.   SHW D: 05/24/2021 5:33:11 pm T: 05/25/2021 1:04:00 am  JOB: [id number]/ [id number]

## 2021-05-26 ENCOUNTER — Inpatient Hospital Stay (HOSPITAL_COMMUNITY): Payer: Managed Care, Other (non HMO)

## 2021-05-26 LAB — GLUCOSE, CAPILLARY
Glucose-Capillary: 102 mg/dL — ABNORMAL HIGH (ref 70–99)
Glucose-Capillary: 125 mg/dL — ABNORMAL HIGH (ref 70–99)
Glucose-Capillary: 136 mg/dL — ABNORMAL HIGH (ref 70–99)
Glucose-Capillary: 140 mg/dL — ABNORMAL HIGH (ref 70–99)
Glucose-Capillary: 93 mg/dL (ref 70–99)
Glucose-Capillary: 98 mg/dL (ref 70–99)

## 2021-05-26 LAB — POCT I-STAT, CHEM 8
BUN: 7 mg/dL (ref 6–20)
Calcium, Ion: 1.05 mmol/L — ABNORMAL LOW (ref 1.15–1.40)
Chloride: 97 mmol/L — ABNORMAL LOW (ref 98–111)
Creatinine, Ser: 0.5 mg/dL — ABNORMAL LOW (ref 0.61–1.24)
Glucose, Bld: 183 mg/dL — ABNORMAL HIGH (ref 70–99)
HCT: 23 % — ABNORMAL LOW (ref 39.0–52.0)
Hemoglobin: 7.8 g/dL — ABNORMAL LOW (ref 13.0–17.0)
Potassium: 4.8 mmol/L (ref 3.5–5.1)
Sodium: 130 mmol/L — ABNORMAL LOW (ref 135–145)
TCO2: 30 mmol/L (ref 22–32)

## 2021-05-26 LAB — COMPREHENSIVE METABOLIC PANEL
ALT: 15 U/L (ref 0–44)
AST: 25 U/L (ref 15–41)
Albumin: 2 g/dL — ABNORMAL LOW (ref 3.5–5.0)
Alkaline Phosphatase: 282 U/L — ABNORMAL HIGH (ref 38–126)
Anion gap: 7 (ref 5–15)
BUN: 10 mg/dL (ref 6–20)
CO2: 28 mmol/L (ref 22–32)
Calcium: 7.8 mg/dL — ABNORMAL LOW (ref 8.9–10.3)
Chloride: 97 mmol/L — ABNORMAL LOW (ref 98–111)
Creatinine, Ser: 0.91 mg/dL (ref 0.61–1.24)
GFR, Estimated: >60 mL/min (ref >60)
Glucose, Bld: 150 mg/dL — ABNORMAL HIGH (ref 70–99)
Potassium: 4.2 mmol/L (ref 3.5–5.1)
Sodium: 132 mmol/L — ABNORMAL LOW (ref 135–145)
Total Bilirubin: 0.6 mg/dL (ref 0.3–1.2)
Total Protein: 5.7 g/dL — ABNORMAL LOW (ref 6.5–8.1)

## 2021-05-26 LAB — POCT I-STAT 7, (LYTES, BLD GAS, ICA,H+H)
Acid-Base Excess: 1 mmol/L (ref 0.0–2.0)
Bicarbonate: 28.5 mmol/L — ABNORMAL HIGH (ref 20.0–28.0)
Calcium, Ion: 1.12 mmol/L — ABNORMAL LOW (ref 1.15–1.40)
HCT: 22 % — ABNORMAL LOW (ref 39.0–52.0)
Hemoglobin: 7.5 g/dL — ABNORMAL LOW (ref 13.0–17.0)
O2 Saturation: 94 %
Patient temperature: 36.8
Potassium: 5 mmol/L (ref 3.5–5.1)
Sodium: 128 mmol/L — ABNORMAL LOW (ref 135–145)
TCO2: 30 mmol/L (ref 22–32)
pCO2 arterial: 60.7 mmHg — ABNORMAL HIGH (ref 32.0–48.0)
pH, Arterial: 7.279 — ABNORMAL LOW (ref 7.350–7.450)
pO2, Arterial: 80 mmHg — ABNORMAL LOW (ref 83.0–108.0)

## 2021-05-26 LAB — CBC
HCT: 23.8 % — ABNORMAL LOW (ref 39.0–52.0)
Hemoglobin: 7.8 g/dL — ABNORMAL LOW (ref 13.0–17.0)
MCH: 28.8 pg (ref 26.0–34.0)
MCHC: 32.8 g/dL (ref 30.0–36.0)
MCV: 87.8 fL (ref 80.0–100.0)
Platelets: 669 10*3/uL — ABNORMAL HIGH (ref 150–400)
RBC: 2.71 MIL/uL — ABNORMAL LOW (ref 4.22–5.81)
RDW: 15.9 % — ABNORMAL HIGH (ref 11.5–15.5)
WBC: 12.6 10*3/uL — ABNORMAL HIGH (ref 4.0–10.5)
nRBC: 0 % (ref 0.0–0.2)

## 2021-05-26 LAB — TYPE AND SCREEN
ABO/RH(D): B POS
Antibody Screen: NEGATIVE
Unit division: 0
Unit division: 0

## 2021-05-26 LAB — BPAM RBC
Blood Product Expiration Date: 202209192359
Blood Product Expiration Date: 202209212359
ISSUE DATE / TIME: 202208260957
ISSUE DATE / TIME: 202208270931
Unit Type and Rh: 7300
Unit Type and Rh: 7300

## 2021-05-26 NOTE — Progress Notes (Signed)
2 Days Post-Op Procedure(s) (LRB): VIDEO ASSISTED THORACOSCOPY (VATS)/DRAINAGE OF HEMOTHORAX, DECORTICATION (Right) Subjective: No  complaints.  Objective: Vital signs in last 24 hours: Temp:  [97.7 F (36.5 C)-98.8 F (37.1 C)] 98.7 F (37.1 C) (08/28 0356) Pulse Rate:  [66-78] 71 (08/28 0356) Cardiac Rhythm: Normal sinus rhythm (08/28 0700) Resp:  [12-20] 13 (08/28 0715) BP: (108-130)/(66-74) 127/69 (08/28 0356) SpO2:  [92 %-96 %] 94 % (08/28 0715) FiO2 (%):  [0 %] 0 % (08/28 0403)  Hemodynamic parameters for last 24 hours:    Intake/Output from previous day: 08/27 0701 - 08/28 0700 In: 607.1 [P.O.:240; I.V.:30; Blood:337.1] Out: 2945 [Urine:2725; Chest Tube:220] Intake/Output this shift: No intake/output data recorded.  General appearance: alert and cooperative Heart: regular rate and rhythm Lungs: clear to auscultation bilaterally Chest tube output 60 cc last shift, thin and serosanguinous.  No air leak  Lab Results: Recent Labs    05/25/21 0045 05/26/21 0032  WBC 19.5* 12.6*  HGB 6.9* 7.8*  HCT 21.6* 23.8*  PLT 671* 669*   BMET:  Recent Labs    05/25/21 0045 05/26/21 0032  NA 127* 132*  K 4.7 4.2  CL 95* 97*  CO2 26 28  GLUCOSE 303* 150*  BUN 12 10  CREATININE 0.77 0.91  CALCIUM 7.6* 7.8*    PT/INR: No results for input(s): LABPROT, INR in the last 72 hours. ABG    Component Value Date/Time   PHART 7.420 05/25/2021 0459   HCO3 25.9 05/25/2021 0459   TCO2 30 05/24/2021 1132   O2SAT 95.6 05/25/2021 0459   CBG (last 3)  Recent Labs    05/26/21 0006 05/26/21 0353 05/26/21 0730  GLUCAP 136* 140* 93   CXR: ok  Assessment/Plan: S/P Procedure(s) (LRB): VIDEO ASSISTED THORACOSCOPY (VATS)/DRAINAGE OF HEMOTHORAX, DECORTICATION (Right)  Doing well. Remove anterior chest tube today and then remaining tube tomorrow. Continue IS, ambulation Hgb stable. Continue iron and repeat in am.   LOS: 2 days    Alleen Borne 05/26/2021

## 2021-05-27 ENCOUNTER — Inpatient Hospital Stay (HOSPITAL_COMMUNITY): Payer: Managed Care, Other (non HMO)

## 2021-05-27 ENCOUNTER — Encounter (HOSPITAL_COMMUNITY): Payer: Self-pay | Admitting: Thoracic Surgery (Cardiothoracic Vascular Surgery)

## 2021-05-27 LAB — CBC
HCT: 25.6 % — ABNORMAL LOW (ref 39.0–52.0)
Hemoglobin: 8.2 g/dL — ABNORMAL LOW (ref 13.0–17.0)
MCH: 28.5 pg (ref 26.0–34.0)
MCHC: 32 g/dL (ref 30.0–36.0)
MCV: 88.9 fL (ref 80.0–100.0)
Platelets: 753 10*3/uL — ABNORMAL HIGH (ref 150–400)
RBC: 2.88 MIL/uL — ABNORMAL LOW (ref 4.22–5.81)
RDW: 15.8 % — ABNORMAL HIGH (ref 11.5–15.5)
WBC: 10.4 10*3/uL (ref 4.0–10.5)
nRBC: 0 % (ref 0.0–0.2)

## 2021-05-27 LAB — GLUCOSE, CAPILLARY
Glucose-Capillary: 122 mg/dL — ABNORMAL HIGH (ref 70–99)
Glucose-Capillary: 128 mg/dL — ABNORMAL HIGH (ref 70–99)
Glucose-Capillary: 167 mg/dL — ABNORMAL HIGH (ref 70–99)
Glucose-Capillary: 168 mg/dL — ABNORMAL HIGH (ref 70–99)

## 2021-05-27 LAB — SURGICAL PATHOLOGY

## 2021-05-27 MED ORDER — LEVOTHYROXINE SODIUM 200 MCG PO TABS: 200.0000 ug | ORAL_TABLET | Freq: Every day | ORAL | Status: DC

## 2021-05-27 MED ORDER — OXYCODONE-ACETAMINOPHEN 7.5-325 MG PO TABS: 1.0000 | ORAL_TABLET | ORAL | 0 refills | Status: AC | PRN

## 2021-05-27 MED ORDER — LEVOTHYROXINE SODIUM 25 MCG PO TABS: 25.0000 ug | ORAL_TABLET | Freq: Every day | ORAL | Status: DC

## 2021-05-27 NOTE — Progress Notes (Signed)
3 Days Post-Op Procedure(s) (LRB): VIDEO ASSISTED THORACOSCOPY (VATS)/DRAINAGE OF HEMOTHORAX, DECORTICATION (Right) Subjective: Pain well controlled  Objective: Vital signs in last 24 hours: Temp:  [97.8 F (36.6 C)-98.4 F (36.9 C)] 97.8 F (36.6 C) (08/29 0413) Pulse Rate:  [60-87] 60 (08/29 0413) Cardiac Rhythm: Normal sinus rhythm (08/29 0019) Resp:  [13-19] 13 (08/29 0413) BP: (125-140)/(67-72) 125/72 (08/29 0413) SpO2:  [92 %-100 %] 97 % (08/29 0413)  Hemodynamic parameters for last 24 hours:    Intake/Output from previous day: 08/28 0701 - 08/29 0700 In: 2262.1 [P.O.:240; I.V.:2022.1] Out: 2720 [Urine:2650; Chest Tube:70] Intake/Output this shift: No intake/output data recorded.  General appearance: alert, cooperative, and no distress Neurologic: intact Heart: regular rate and rhythm Lungs: clear to auscultation bilaterally No air leak  Lab Results: Recent Labs    05/26/21 0032 05/27/21 0016  WBC 12.6* 10.4  HGB 7.8* 8.2*  HCT 23.8* 25.6*  PLT 669* 753*   BMET:  Recent Labs    05/25/21 0045 05/26/21 0032  NA 127* 132*  K 4.7 4.2  CL 95* 97*  CO2 26 28  GLUCOSE 303* 150*  BUN 12 10  CREATININE 0.77 0.91  CALCIUM 7.6* 7.8*    PT/INR: No results for input(s): LABPROT, INR in the last 72 hours. ABG    Component Value Date/Time   PHART 7.420 05/25/2021 0459   HCO3 25.9 05/25/2021 0459   TCO2 30 05/24/2021 1132   O2SAT 95.6 05/25/2021 0459   CBG (last 3)  Recent Labs    05/26/21 1957 05/27/21 0018 05/27/21 0411  GLUCAP 125* 128* 168*    Assessment/Plan: S/P Procedure(s) (LRB): VIDEO ASSISTED THORACOSCOPY (VATS)/DRAINAGE OF HEMOTHORAX, DECORTICATION (Right) - No air leak and minimal drainage - dc chest tube - dc PCA CXR shows minimal residual space Will remove CT and if follow up CXR OK, Dc home later today   LOS: 3 days    Loreli Slot 05/27/2021

## 2021-05-27 NOTE — Progress Notes (Signed)
2 PIV's removed without complication.

## 2021-05-27 NOTE — Progress Notes (Signed)
Chest tube removed without complication. Patient resting in bed, xray already ordered after removal.

## 2021-05-27 NOTE — Progress Notes (Signed)
Discharge instructions given to patient at bedside. Patient states he understands follow up appointments, wound instructions and has no further questions. Patient has friend picking him up at [facility] parking.

## 2021-05-27 NOTE — Plan of Care (Signed)
  Problem: Education: Goal: Knowledge of disease or condition will improve Outcome: Adequate for Discharge Goal: Knowledge of the prescribed therapeutic regimen will improve Outcome: Adequate for Discharge   Problem: Activity: Goal: Risk for activity intolerance will decrease Outcome: Adequate for Discharge   Problem: Cardiac: Goal: Will achieve and/or maintain hemodynamic stability Outcome: Adequate for Discharge   Problem: Clinical Measurements: Goal: Postoperative complications will be avoided or minimized Outcome: Adequate for Discharge   Problem: Respiratory: Goal: Respiratory status will improve Outcome: Adequate for Discharge   Problem: Pain Management: Goal: Pain level will decrease Outcome: Adequate for Discharge   Problem: Skin Integrity: Goal: Wound healing without signs and symptoms infection will improve Outcome: Adequate for Discharge   Problem: Education: Goal: Knowledge of General Education information will improve Description: Including pain rating scale, medication(s)/side effects and non-pharmacologic comfort measures Outcome: Adequate for Discharge   Problem: Health Behavior/Discharge Planning: Goal: Ability to manage health-related needs will improve Outcome: Adequate for Discharge   Problem: Clinical Measurements: Goal: Ability to maintain clinical measurements within normal limits will improve Outcome: Adequate for Discharge Goal: Will remain free from infection Outcome: Adequate for Discharge Goal: Diagnostic test results will improve Outcome: Adequate for Discharge Goal: Respiratory complications will improve Outcome: Adequate for Discharge Goal: Cardiovascular complication will be avoided Outcome: Adequate for Discharge   Problem: Activity: Goal: Risk for activity intolerance will decrease Outcome: Adequate for Discharge   Problem: Nutrition: Goal: Adequate nutrition will be maintained Outcome: Adequate for Discharge   Problem:  Coping: Goal: Level of anxiety will decrease Outcome: Adequate for Discharge   Problem: Elimination: Goal: Will not experience complications related to bowel motility Outcome: Adequate for Discharge Goal: Will not experience complications related to urinary retention Outcome: Adequate for Discharge   Problem: Pain Managment: Goal: General experience of comfort will improve Outcome: Adequate for Discharge   Problem: Safety: Goal: Ability to remain free from injury will improve Outcome: Adequate for Discharge   Problem: Skin Integrity: Goal: Risk for impaired skin integrity will decrease Outcome: Adequate for Discharge   

## 2021-05-27 NOTE — Discharge Instructions (Signed)

## 2021-05-28 NOTE — Anesthesia Postprocedure Evaluation (Signed)
Anesthesia Post Note  Patient: Willie Spencer  Procedure(s) Performed: VIDEO ASSISTED THORACOSCOPY (VATS)/DRAINAGE OF HEMOTHORAX, DECORTICATION (Right: Chest)     Patient location during evaluation: PACU Anesthesia Type: General Level of consciousness: awake and alert Pain management: pain level controlled Vital Signs Assessment: post-procedure vital signs reviewed and stable Respiratory status: spontaneous breathing, nonlabored ventilation, respiratory function stable and patient connected to nasal cannula oxygen Cardiovascular status: blood pressure returned to baseline and stable Postop Assessment: no apparent nausea or vomiting Anesthetic complications: no   No notable events documented.  Last Vitals:  Vitals:   05/27/21 0754 05/27/21 1053  BP: 116/69 129/80  Pulse: 64   Resp: 14 19  Temp: 36.8 C 36.5 C  SpO2:      Last Pain:  Vitals:   05/27/21 1053  TempSrc: Oral  PainSc:                  [name]     

## 2021-05-28 NOTE — Anesthesia Postprocedure Evaluation (Signed)
Anesthesia Post Note  Patient: Willie Spencer  Procedure(s) Performed: VIDEO ASSISTED THORACOSCOPY (VATS)/DRAINAGE OF HEMOTHORAX, DECORTICATION (Right: Chest)     Patient location during evaluation: PACU Anesthesia Type: General Level of consciousness: awake and alert Pain management: pain level controlled Vital Signs Assessment: post-procedure vital signs reviewed and stable Respiratory status: spontaneous breathing, nonlabored ventilation, respiratory function stable and patient connected to nasal cannula oxygen Cardiovascular status: blood pressure returned to baseline and stable Postop Assessment: no apparent nausea or vomiting Anesthetic complications: no   No notable events documented.  Last Vitals:  Vitals:   05/27/21 0754 05/27/21 1053  BP: 116/69 129/80  Pulse: 64   Resp: 14 19  Temp: 36.8 C 36.5 C  SpO2:      Last Pain:  Vitals:   05/27/21 1053  TempSrc: Oral  PainSc:                  Dixie Coppa

## 2021-05-29 LAB — AEROBIC/ANAEROBIC CULTURE W GRAM STAIN (SURGICAL/DEEP WOUND)

## 2021-05-31 ENCOUNTER — Telehealth: Payer: Self-pay | Admitting: *Deleted

## 2021-05-31 ENCOUNTER — Encounter: Payer: Self-pay | Admitting: *Deleted

## 2021-05-31 NOTE — Telephone Encounter (Signed)
-----   Message from Loreli Slot, MD sent at 05/31/2021 12:49 PM EDT ----- Regarding: RE: return to work Next week os OK  [facility] ----- Message ----- From: Ludwig Clarks, RN Sent: 05/31/2021  12:13 PM EDT To: Loreli Slot, MD Subject: return to work                                 Dr. Dorris Fetch,  Mr. Candelas called requesting to go back to work. He is s/p VATs for decort/evacuation of hemothorax 8/26. 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 He states he hasn't taken oxycodone in 2-3 days. Follows up with you at the end of Sept but is hoping to go back to work next week. Please advise.  Thanks,  [employer]

## 2021-06-21 ENCOUNTER — Other Ambulatory Visit: Payer: Self-pay | Admitting: Thoracic Surgery (Cardiothoracic Vascular Surgery)

## 2021-06-21 DIAGNOSIS — J942 Hemothorax: Secondary | ICD-10-CM

## 2021-06-25 ENCOUNTER — Ambulatory Visit (INDEPENDENT_AMBULATORY_CARE_PROVIDER_SITE_OTHER): Payer: Self-pay | Admitting: Thoracic Surgery (Cardiothoracic Vascular Surgery)

## 2021-06-25 ENCOUNTER — Ambulatory Visit
Admission: RE | Admit: 2021-06-25 | Discharge: 2021-06-25 | Disposition: A | Discharge status: Home / Self Care | Payer: Managed Care, Other (non HMO) | Source: Ambulatory Visit | Attending: Thoracic Surgery (Cardiothoracic Vascular Surgery) | Admitting: Thoracic Surgery (Cardiothoracic Vascular Surgery)

## 2021-06-25 ENCOUNTER — Other Ambulatory Visit: Payer: Self-pay

## 2021-06-25 ENCOUNTER — Encounter: Payer: Self-pay | Admitting: Thoracic Surgery (Cardiothoracic Vascular Surgery)

## 2021-06-25 VITALS — Resp 20 | Ht 75.0 in | Wt 228.0 lb

## 2021-06-25 DIAGNOSIS — J942 Hemothorax: Secondary | ICD-10-CM

## 2021-06-25 DIAGNOSIS — Z5332 Thoracoscopic surgical procedure converted to open procedure: Secondary | ICD-10-CM

## 2021-06-25 LAB — FUNGUS CULTURE WITH STAIN

## 2021-06-25 LAB — FUNGUS CULTURE RESULT

## 2021-06-25 LAB — FUNGAL ORGANISM REFLEX

## 2021-06-25 NOTE — Progress Notes (Signed)
[address]       [place] [address]             [phone]     HPI: Mr. Willie Spencer returns for a scheduled postoperative follow-up visit  Dequann Jourdan is a 55 year old man with a history of type 1 diabetes, post ablative hypothyroidism, hypertension, hyperlipidemia, and tobacco abuse.  He was thrown off a horse about 2 months ago.  He had extensive bruising and pain in his chest and flank.  He had a near syncopal event and then went to the emergency room.  He was found to have multiple right-sided rib fractures and hemothorax.  He had a chest tube placed which drained the hemothorax.  He presented back for follow-up appointment and was found to have a large right pleural effusion.  At thoracentesis only a small amount of fluid can be evacuated.  He was referred to me.  I did a right VATS for drainage of a fibrothorax and decortication on 05/24/2021.  There was about 2 L of fluid and debris in the chest and there was a dense peel on the lung.  He did well postoperatively and went home on day 3.  He took oxycodone for a couple days, but has not had to take any since.  He started back to work within a week after discharge.  He says he will occasionally have a pain in that area not sure if it is the incisions of the rib fractures.  His breathing has improved dramatically.  Past Medical History:  Diagnosis Date   Diabetes mellitus without complication (HCC)    Dyspnea    GERD (gastroesophageal reflux disease)    Hypertension    Hypothyroidism    Sleep apnea    Thyroid disease     Current Outpatient Medications  Medication Sig Dispense Refill   atorvastatin (LIPITOR) 20 MG tablet Take 1 tablet (20 mg total) by mouth daily. 90 tablet 3   Continuous Blood Gluc Receiver (FREESTYLE LIBRE 2 READER) DEVI 1 each by Does not apply route daily. 1 each 0   FREESTYLE TEST STRIPS test strip Use as instructed to test blood sugar up to 4 times daily. 100 each 5   Glucagon 3 MG/DOSE  POWD Place 3 mg into the nose once as needed for up to 1 dose. 1 each 11   ibuprofen (ADVIL) 200 MG tablet Take 400-600 mg by mouth every 6 (six) hours as needed for moderate pain.     Insulin Disposable Pump (OMNIPOD 5 PACK) MISC Use as instructed change pod every 1.5 days (Patient taking differently: Inject 1 Dose into the skin See admin instructions. Use as instructed change pod every 3 days. 100 units for 3 days) 27 each 2   Insulin Pen Needle 32G X 6 MM MISC To use with insulin pen. 100 each 4   levothyroxine (SYNTHROID) 200 MCG tablet Take 1 tablet (200 mcg total) by mouth daily before breakfast. Take with 25 mcg dose to equal 225 mcg daily     LYUMJEV 100 UNIT/ML SOLN Inject into the skin. Use in omnipod, averages about 100 units every 3 days     Multiple Vitamin (MULTIVITAMIN WITH MINERALS) TABS tablet Take 1 tablet by mouth daily.     naproxen sodium (ALEVE) 220 MG tablet Take 440 mg by mouth 2 (two) times daily as needed (pain).     NOVOLOG FLEXPEN 100 UNIT/ML FlexPen USE TO INJECT 10-15 UNITS 3 TIMES A DAY. (  Patient taking differently: Inject 10-15 Units into the skin See admin instructions. Use to inject 10 units 3 times a day as needed if omnipod doesn't work) 15 pen 1   Sennosides (EX-LAX PO) Take 1 tablet by mouth daily as needed (constipation).     No current facility-administered medications for this visit.    Physical Exam Resp 20   Ht 6\' 3"  (1.905 m)   Wt 228 lb (103.4 kg)   BMI 28.43 kg/m  55 year old man in no acute distress Alert and oriented x3 with no focal deficits Lungs clear with equal breath sounds bilaterally Incisions healing well with minimal erythema where sutures were in place from the chest tubes  Diagnostic Tests: CHEST - 2 VIEW   COMPARISON:  05/27/2021   FINDINGS: Frontal and lateral views of the chest demonstrate a stable cardiac silhouette. There has been interval resolution of the right-sided pneumothorax seen previously. Pleural thickening or  trace effusion is seen at the right lateral lung base. Scattered densities at the right lung base likely reflect pleural thickening or atelectasis. The left chest is clear.   Multiple healing right-sided rib fractures are noted, with increased callus formation.   IMPRESSION: 1. Postsurgical changes from right-sided decortication, with pleural thickening and/or atelectasis as above. Resolution of previous pneumothorax. 2. Healing right-sided rib fractures.     Electronically Signed   By: 05/29/2021 M.D.   On: 06/25/2021 15:44 I personally reviewed his chest x-ray images.  There is dramatic improvement in comparison to his preoperative films.  Impression: Willie Spencer is a 55 year old man who had multiple rib fractures and a right hemothorax after being thrown from a horse.  He developed a fibrothorax.  I did a right VATS for drainage of the fluid and debris and decortication of the lung on 05/24/2021.  He is doing exceptionally well.  His respiratory status is dramatically improved.  He is having minimal discomfort.  He is already back at work.  There are no restrictions on his activities, but advised him to build into new activities gradually.  Plan: I will be happy to see Mr. Lares back anytime in the future if I can be of any further assistance with his care  Yetta Barre, MD [facility] [phone]

## 2021-07-06 LAB — ACID FAST CULTURE WITH REFLEXED SENSITIVITIES (MYCOBACTERIA)
Acid Fast Culture: NEGATIVE
Acid Fast Culture: NEGATIVE
Acid Fast Culture: NEGATIVE

## 2021-08-19 ENCOUNTER — Other Ambulatory Visit: Payer: Self-pay | Admitting: Internal Medicine

## 2021-10-10 ENCOUNTER — Ambulatory Visit (INDEPENDENT_AMBULATORY_CARE_PROVIDER_SITE_OTHER): Payer: Managed Care, Other (non HMO) | Admitting: Internal Medicine

## 2021-10-10 ENCOUNTER — Encounter: Payer: Self-pay | Admitting: Internal Medicine

## 2021-10-10 ENCOUNTER — Other Ambulatory Visit: Payer: Self-pay

## 2021-10-10 VITALS — BP 128/80 | HR 65 | Ht 75.0 in | Wt 237.6 lb

## 2021-10-10 DIAGNOSIS — E890 Postprocedural hypothyroidism: Secondary | ICD-10-CM | POA: Diagnosis not present

## 2021-10-10 DIAGNOSIS — E785 Hyperlipidemia, unspecified: Secondary | ICD-10-CM

## 2021-10-10 DIAGNOSIS — E1065 Type 1 diabetes mellitus with hyperglycemia: Secondary | ICD-10-CM | POA: Diagnosis not present

## 2021-10-10 LAB — LIPID PANEL
Cholesterol: 138 mg/dL (ref 0–200)
HDL: 50.7 mg/dL (ref >39.00)
LDL Cholesterol: 72 mg/dL (ref 0–99)
NonHDL: 87.77
Total CHOL/HDL Ratio: 3
Triglycerides: 80 mg/dL (ref 0.0–149.0)
VLDL: 16 mg/dL (ref 0.0–40.0)

## 2021-10-10 LAB — POCT GLYCOSYLATED HEMOGLOBIN (HGB A1C): Hemoglobin A1C: 7.6 % — AB (ref 4.0–5.6)

## 2021-10-10 LAB — T4, FREE: Free T4: 1.12 ng/dL (ref 0.60–1.60)

## 2021-10-10 LAB — TSH: TSH: 2.74 u[IU]/mL (ref 0.35–5.50)

## 2021-10-10 MED ORDER — FREESTYLE LIBRE 3 SENSOR MISC: 1.0000 | 3 refills | Status: DC

## 2021-10-10 MED ORDER — OMNIPOD 5 DEXG7G6 INTRO GEN 5 KIT: 1.0000 | PACK | 0 refills | Status: DC | PRN

## 2021-10-10 MED ORDER — OMNIPOD 5 DEXG7G6 PODS GEN 5 MISC: 1.0000 | 3 refills | Status: DC

## 2021-10-10 MED ORDER — GLUCAGON 3 MG/DOSE NA POWD: 3.0000 mg | Freq: Once | NASAL | 11 refills | Status: DC | PRN
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
## 2021-10-10 NOTE — Patient Instructions (Addendum)
Please change: - basal rates: 12 am: 1.6 3 am:  1.6 5 am: 1.7 >> 1.5 9 am: 1.3 1 pm: 1.4 - ICR:  12 am: 4  5 pm: 4 >> 3.5 - target: 100-100 - ISF: 35 - Insulin on Board: 4 hours  Try to enter ALL carbs at the beginning of the meal. Try to enter at least 20g carbs for coffee if you add the creamer. Try to use a temporary basal rate of 80% when exercising.   Please continue levothyroxine 225 mcg daily.   Take the thyroid hormone every day, with water, at least 30 minutes before breakfast, separated by at least 4 hours from: - acid reflux medications - calcium - iron - multivitamins   Please return in 3 months.

## 2022-01-16 ENCOUNTER — Encounter: Payer: Self-pay | Admitting: Internal Medicine

## 2022-01-16 ENCOUNTER — Ambulatory Visit (INDEPENDENT_AMBULATORY_CARE_PROVIDER_SITE_OTHER): Payer: Managed Care, Other (non HMO) | Admitting: Internal Medicine

## 2022-01-16 VITALS — BP 132/84 | HR 64 | Ht 75.0 in | Wt 238.8 lb

## 2022-01-16 DIAGNOSIS — E890 Postprocedural hypothyroidism: Secondary | ICD-10-CM | POA: Diagnosis not present

## 2022-01-16 DIAGNOSIS — E785 Hyperlipidemia, unspecified: Secondary | ICD-10-CM | POA: Diagnosis not present

## 2022-01-16 DIAGNOSIS — E1065 Type 1 diabetes mellitus with hyperglycemia: Secondary | ICD-10-CM | POA: Diagnosis not present

## 2022-01-16 LAB — POCT GLYCOSYLATED HEMOGLOBIN (HGB A1C): Hemoglobin A1C: 7.1 % — AB (ref 4.0–5.6)

## 2022-01-16 MED ORDER — LEVOTHYROXINE SODIUM 200 MCG PO TABS: 200.0000 ug | ORAL_TABLET | Freq: Every day | ORAL | 3 refills | Status: DC

## 2022-01-16 MED ORDER — LEVOTHYROXINE SODIUM 25 MCG PO TABS: 25.0000 ug | ORAL_TABLET | Freq: Every day | ORAL | 3 refills | Status: DC

## 2022-01-16 MED ORDER — FREESTYLE LIBRE 3 SENSOR MISC: 1.0000 | 3 refills | Status: DC

## 2022-01-16 MED ORDER — LYUMJEV 100 UNIT/ML IJ SOLN: INTRAMUSCULAR | 3 refills | Status: DC

## 2022-01-16 NOTE — Patient Instructions (Addendum)
Please change: ?- basal rates: ?12 am: 1.6 >> 1.45 ?3 am:  1.6 >> 1.45 ?5 am: 1.5  >> 1.4 ?9 am: 1.3 ?1 pm: 1.4 ?- ICR:  ?12 am: 4  ?5 pm: 4 >> 3.5 ?- target: 110-180 >> 110-160 ?- ISF: 50 ?- Active insulin time: 4 hours ? ?Try to enter ALL carbs at the beginning of the meal. ?Try to enter at least 20g carbs for coffee if you add the creamer. ?Try to use a temporary basal rate of 80% when exercising. ? ?Please always check blood sugars with your glucometer when correcting a very high or a low sugar. ?  ?Please continue levothyroxine 225 mcg daily. ?  ?Take the thyroid hormone every day, with water, at least 30 minutes before breakfast, separated by at least 4 hours from: ?- acid reflux medications ?- calcium ?- iron ?- multivitamins  ? ?Please return in 3-4 months. ?
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
## 2022-04-03 ENCOUNTER — Other Ambulatory Visit: Payer: Self-pay | Admitting: Internal Medicine

## 2022-04-28 ENCOUNTER — Ambulatory Visit: Payer: Managed Care, Other (non HMO) | Admitting: Internal Medicine

## 2022-04-28 ENCOUNTER — Encounter: Payer: Self-pay | Admitting: Internal Medicine

## 2022-04-28 VITALS — BP 132/84 | HR 58 | Ht 75.0 in | Wt 237.4 lb

## 2022-04-28 DIAGNOSIS — E890 Postprocedural hypothyroidism: Secondary | ICD-10-CM

## 2022-04-28 DIAGNOSIS — E785 Hyperlipidemia, unspecified: Secondary | ICD-10-CM | POA: Diagnosis not present

## 2022-04-28 DIAGNOSIS — E1065 Type 1 diabetes mellitus with hyperglycemia: Secondary | ICD-10-CM

## 2022-04-28 LAB — POCT GLYCOSYLATED HEMOGLOBIN (HGB A1C): Hemoglobin A1C: 6.8 % — AB (ref 4.0–5.6)

## 2022-04-28 NOTE — Patient Instructions (Addendum)
Please change: - basal rates: 12 am: 1.45 3 am:  1.45 >> 1.4 5 am: 1.4 >> 1.35 9 am: 1.3 1 pm: 1.4 - ICR:  12 am: 4  5 pm: 3.5 - target: 110-160 >> 110-140 - ISF: 50 - Active insulin time: 4 hours  Try to enter ALL carbs at the beginning of the meal. Try to enter at least 20g carbs for coffee if you add the creamer. Try to use a temporary basal rate of 80% when exercising. Please always check blood sugars with your glucometer when correcting a very high or a low sugar.  PLEASE SCHEDULE A NEW EYE EXAM!   Please continue levothyroxine 225 mcg daily.   Take the thyroid hormone every day, with water, at least 30 minutes before breakfast, separated by at least 4 hours from: - acid reflux medications - calcium - iron - multivitamins   Please return in 4 months.
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
## 2022-09-02 ENCOUNTER — Ambulatory Visit: Payer: Managed Care, Other (non HMO) | Admitting: Internal Medicine

## 2022-09-02 ENCOUNTER — Encounter: Payer: Self-pay | Admitting: Internal Medicine

## 2022-09-02 VITALS — BP 140/82 | HR 62 | Ht 75.0 in | Wt 255.0 lb

## 2022-09-02 DIAGNOSIS — E785 Hyperlipidemia, unspecified: Secondary | ICD-10-CM | POA: Diagnosis not present

## 2022-09-02 DIAGNOSIS — E1065 Type 1 diabetes mellitus with hyperglycemia: Secondary | ICD-10-CM

## 2022-09-02 DIAGNOSIS — E890 Postprocedural hypothyroidism: Secondary | ICD-10-CM

## 2022-09-02 LAB — POCT GLYCOSYLATED HEMOGLOBIN (HGB A1C): Hemoglobin A1C: 6.5 % — AB (ref 4.0–5.6)

## 2022-09-02 MED ORDER — FREESTYLE LITE TEST VI STRP: ORAL_STRIP | 5 refills | Status: AC

## 2022-09-02 MED ORDER — FREESTYLE LIBRE 3 SENSOR MISC: 1.0000 | 3 refills | Status: DC

## 2022-09-02 MED ORDER — OMNIPOD 5 DEXG7G6 PODS GEN 5 MISC: 1.0000 | 3 refills | Status: DC

## 2022-09-02 MED ORDER — ATORVASTATIN CALCIUM 20 MG PO TABS: 20.0000 mg | ORAL_TABLET | Freq: Every day | ORAL | 3 refills | Status: DC

## 2022-09-02 MED ORDER — LYUMJEV 100 UNIT/ML IJ SOLN: INTRAMUSCULAR | 3 refills | Status: DC

## 2022-09-02 NOTE — Patient Instructions (Addendum)
Please use the following pump settings: - basal rates: 12 am: 1.45 >> 1.35 3 am:  1.4 >> 1.3 5 am: 1.35 >> 1.3 9 am: 1.3 >> 1.2 1 pm: 1.4 >> 1.3 6 pm: 1.4 - ICR:  12 am: 4  5 pm: 3.5 - target: 110-140 - ISF: 50 - Active insulin time: 4 hours  Try to enter ALL carbs at the beginning of the meal. Try to enter at least 20g carbs for coffee if you add the creamer. Try to use a temporary basal rate of 80% when exercising. Please always check blood sugars with your glucometer when correcting a very high or a low sugar.  PLEASE SCHEDULE A NEW EYE EXAM!   Please continue levothyroxine 225 mcg daily.   Take the thyroid hormone every day, with water, at least 30 minutes before breakfast, separated by at least 4 hours from: - acid reflux medications - calcium - iron - multivitamins   Please return in 4 months.
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
## 2022-09-04 ENCOUNTER — Telehealth: Payer: Self-pay | Admitting: Dietician

## 2022-09-04 NOTE — Telephone Encounter (Signed)
Patient's wife called and left a message.  She stated that she has linked [facility]'s PDM to [facility] and entered our office.  She asks if our office needs anything regarding this. Will forward to Ingenio who saw them 09/02/22 who will call if needed.  Oran Rein, RD, LDN, CDCES

## 2022-09-08 ENCOUNTER — Telehealth: Payer: Self-pay

## 2022-09-08 DIAGNOSIS — E1065 Type 1 diabetes mellitus with hyperglycemia: Secondary | ICD-10-CM

## 2022-09-08 MED ORDER — INSULIN GLARGINE-YFGN 100 UNIT/ML ~~LOC~~ SOLN: 30.0000 [IU] | Freq: Every day | SUBCUTANEOUS | 0 refills | Status: DC

## 2022-09-08 MED ORDER — LYUMJEV KWIKPEN 100 UNIT/ML ~~LOC~~ SOPN: 15.0000 [IU] | PEN_INJECTOR | Freq: Three times a day (TID) | SUBCUTANEOUS | 1 refills | Status: DC

## 2022-09-08 MED ORDER — INSULIN PEN NEEDLE 32G X 6 MM MISC
1 refills | Status: AC
Start: 2022-09-08 — End: ?

## 2022-09-08 NOTE — Telephone Encounter (Signed)
Pt is unable to get refill on Omnipod until 09/26/22. Pt called asking if we have any samples of pods to hold him until insurance will pick back up.

## 2022-09-08 NOTE — Telephone Encounter (Signed)
They upgraded to the Omnipod 5 on their own, and did not link the PDM to [employer].  It is now linked if you want to view.

## 2022-09-08 NOTE — Telephone Encounter (Signed)
T, We could use: - Lantus (or whichever long-acting insulin is covered for him): 30 units at bedtime -and may need to increase the dose depending on the blood sugars in 2-3 days - Novolog pens (he can also [place] the Lyumjev vials) -I believe he has both at home: - Insulin to carb ratio: 1:4 - target: 150 - Insulin sensitivity factor: 50 Let me know how he is doing on this regimen in few days. Sincerely, Carlus Pavlov MD

## 2022-09-08 NOTE — Telephone Encounter (Signed)
Rx for [facility] and Lyumjev sent to preferred pharmacy.

## 2022-09-10 ENCOUNTER — Telehealth: Payer: Self-pay

## 2022-09-10 NOTE — Telephone Encounter (Signed)
Pt's spouse contacted the office to advise pt's pod is not lasting the full 3 days as prescribed and he is running out of pods every month. States pt pods typically last 1.5 days. Wanting rx to be adjusted to reflect current use.

## 2022-09-24 ENCOUNTER — Other Ambulatory Visit: Payer: Self-pay

## 2022-09-24 DIAGNOSIS — E1065 Type 1 diabetes mellitus with hyperglycemia: Secondary | ICD-10-CM

## 2022-09-24 MED ORDER — OMNIPOD 5 DEXG7G6 PODS GEN 5 MISC: 1.0000 | 3 refills | Status: DC

## 2022-09-24 NOTE — Telephone Encounter (Signed)
Requesting updated rx for Pods to reflect pt changing every 2 days. Rx sent.

## 2022-09-25 ENCOUNTER — Telehealth: Payer: Self-pay | Admitting: Internal Medicine

## 2022-09-25 ENCOUNTER — Other Ambulatory Visit (HOSPITAL_COMMUNITY): Payer: Self-pay

## 2022-09-25 DIAGNOSIS — E1065 Type 1 diabetes mellitus with hyperglycemia: Secondary | ICD-10-CM

## 2022-09-25 NOTE — Telephone Encounter (Signed)
Patient needs the prescription for the LYUMJEV 100 UNIT/ML SOLN  (Vials) updated to reflect patient usages. EX:  new Dash pod 1.5 days and would insulin vial every 1.5 days as well.    [facility] [address], [place]   Any questions call wife Judeth Cornfield at [phone].

## 2022-09-25 NOTE — Telephone Encounter (Signed)
Called and lvm for pt to call back to confirm daily dose of insulin to update rx.

## 2022-09-26 MED ORDER — LYUMJEV 100 UNIT/ML IJ SOLN: INTRAMUSCULAR | 3 refills | Status: DC

## 2022-09-26 NOTE — Telephone Encounter (Signed)
Updated rx sent to pharmacy

## 2022-09-26 NOTE — Telephone Encounter (Signed)
Patient's wife, Willie Spencer, called and said that she picked up 3 vials of  Lyumjev KwikPen Insulin Lispro-aabc (LYUMJEV KWIKPEN) 100 UNIT/ML KwikPen and she states that that was not enough.  Her out of pocket expense was $75.00 for the 3 vials and she normally gets 10 vials for $75.00.  Willie Spencer wants the correction made for the vials to reflect the correct supply for the mnipod 5 G6 Pod (Gen 5) Insulin Disposable Pump (OMNIPOD 5 G6 POD, GEN 5,) MISC.  Willie Spencer is very upset and would like to speak with the MA concerning this matter as soon as possible.  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  Please call wife, Willie Spencer, at [phone].

## 2022-09-30 ENCOUNTER — Other Ambulatory Visit (HOSPITAL_COMMUNITY): Payer: Self-pay

## 2022-09-30 ENCOUNTER — Telehealth: Payer: Self-pay | Admitting: Pharmacy Technician

## 2022-09-30 NOTE — Telephone Encounter (Signed)
Pharmacy Patient Advocate Encounter   Received notification from PT/RMA that prior authorization for Omnipod is required/requested.  Per Test Claim: PA required   PA submitted on 09/30/22 to (ins) [employer] [place] via [employer] [id number]  PA Case ID: [id number] Status is pending

## 2022-10-01 NOTE — Telephone Encounter (Signed)
Patient Advocate Encounter  Prior Authorization for Omnipod 5 G6 Pod (Gen 5) has been approved through [place].    KeyUlla Spencer  Effective: 10-01-2022 to 10-02-2023

## 2022-10-02 ENCOUNTER — Other Ambulatory Visit (HOSPITAL_COMMUNITY): Payer: Self-pay

## 2022-10-02 NOTE — Telephone Encounter (Signed)
Pt can get up to 30/30 per letter. Per test claim they will go through 45/90, but copay is $210.

## 2022-11-20 ENCOUNTER — Other Ambulatory Visit: Payer: Self-pay | Admitting: Internal Medicine

## 2022-11-20 DIAGNOSIS — E1065 Type 1 diabetes mellitus with hyperglycemia: Secondary | ICD-10-CM

## 2023-01-08 ENCOUNTER — Ambulatory Visit: Payer: Managed Care, Other (non HMO) | Admitting: Internal Medicine
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
## 2023-01-12 ENCOUNTER — Ambulatory Visit (INDEPENDENT_AMBULATORY_CARE_PROVIDER_SITE_OTHER): Payer: BC Managed Care – PPO | Admitting: Internal Medicine

## 2023-01-12 ENCOUNTER — Encounter: Payer: Self-pay | Admitting: Internal Medicine

## 2023-01-12 VITALS — BP 138/79 | HR 70 | Ht 75.0 in | Wt 248.8 lb

## 2023-01-12 DIAGNOSIS — E1065 Type 1 diabetes mellitus with hyperglycemia: Secondary | ICD-10-CM | POA: Diagnosis not present

## 2023-01-12 DIAGNOSIS — E785 Hyperlipidemia, unspecified: Secondary | ICD-10-CM

## 2023-01-12 DIAGNOSIS — E890 Postprocedural hypothyroidism: Secondary | ICD-10-CM | POA: Diagnosis not present

## 2023-01-12 LAB — COMPREHENSIVE METABOLIC PANEL
ALT: 16 U/L (ref 0–53)
AST: 20 U/L (ref 0–37)
Albumin: 4.3 g/dL (ref 3.5–5.2)
Alkaline Phosphatase: 96 U/L (ref 39–117)
BUN: 17 mg/dL (ref 6–23)
CO2: 31 mEq/L (ref 19–32)
Calcium: 8.7 mg/dL (ref 8.4–10.5)
Chloride: 98 mEq/L (ref 96–112)
Creatinine, Ser: 1.18 mg/dL (ref 0.40–1.50)
GFR: 69.7 mL/min (ref >60.00)
Glucose, Bld: 195 mg/dL — ABNORMAL HIGH (ref 70–99)
Potassium: 4.3 mEq/L (ref 3.5–5.1)
Sodium: 136 mEq/L (ref 135–145)
Total Bilirubin: 0.5 mg/dL (ref 0.2–1.2)
Total Protein: 6.8 g/dL (ref 6.0–8.3)

## 2023-01-12 LAB — MICROALBUMIN / CREATININE URINE RATIO
Creatinine,U: 134.9 mg/dL
Microalb Creat Ratio: 0.5 mg/g (ref 0.0–30.0)
Microalb, Ur: <0.7 mg/dL (ref 0.0–1.9)

## 2023-01-12 LAB — LIPID PANEL
Cholesterol: 145 mg/dL (ref 0–200)
HDL: 39.7 mg/dL (ref >39.00)
LDL Cholesterol: 66 mg/dL (ref 0–99)
NonHDL: 105.45
Total CHOL/HDL Ratio: 4
Triglycerides: 195 mg/dL — ABNORMAL HIGH (ref 0.0–149.0)
VLDL: 39 mg/dL (ref 0.0–40.0)

## 2023-01-12 LAB — POCT GLYCOSYLATED HEMOGLOBIN (HGB A1C): Hemoglobin A1C: 7.2 % — AB (ref 4.0–5.6)

## 2023-01-12 LAB — TSH: TSH: 1.29 u[IU]/mL (ref 0.35–5.50)

## 2023-01-12 MED ORDER — FIASP 100 UNIT/ML IJ SOLN: INTRAMUSCULAR | 3 refills | Status: DC

## 2023-01-12 NOTE — Patient Instructions (Addendum)
Please use the following pump settings: - basal rates: 12 am: 1.35 3 am:  1.3 5 am: 1.3 9 am: 1.2 1 pm: 1.3 6 pm: 1.4 - ICR:  12 am: 4  5 pm: 4 - target: 110-140 - ISF: 50 - Active insulin time: 4 hours  Try to enter ALL carbs at the beginning of the meal. Try to enter at least 20g carbs for coffee if you add the creamer. Try to use a temporary basal rate of 80% when exercising. Please always check blood sugars with your glucometer when correcting a very high or a low sugar.  PLEASE SCHEDULE A NEW EYE EXAM!   Please continue levothyroxine 225 mcg daily.   Take the thyroid hormone every day, with water, at least 30 minutes before breakfast, separated by at least 4 hours from: - acid reflux medications - calcium - iron - multivitamins   Please return in 4 months.
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
## 2023-02-11 ENCOUNTER — Other Ambulatory Visit: Payer: Self-pay | Admitting: Internal Medicine

## 2023-02-18 ENCOUNTER — Encounter: Payer: Self-pay | Admitting: Internal Medicine

## 2023-05-19 ENCOUNTER — Ambulatory Visit: Payer: BC Managed Care – PPO | Admitting: Internal Medicine

## 2023-05-19 ENCOUNTER — Encounter: Payer: Self-pay | Admitting: Internal Medicine

## 2023-05-19 VITALS — BP 168/82 | HR 73 | Ht 75.0 in | Wt 249.0 lb

## 2023-05-19 DIAGNOSIS — E1065 Type 1 diabetes mellitus with hyperglycemia: Secondary | ICD-10-CM | POA: Diagnosis not present

## 2023-05-19 DIAGNOSIS — E785 Hyperlipidemia, unspecified: Secondary | ICD-10-CM | POA: Diagnosis not present

## 2023-05-19 DIAGNOSIS — E890 Postprocedural hypothyroidism: Secondary | ICD-10-CM

## 2023-05-19 LAB — POCT GLYCOSYLATED HEMOGLOBIN (HGB A1C): Hemoglobin A1C: 7.2 % — AB (ref 4.0–5.6)

## 2023-05-19 MED ORDER — DEXCOM G7 RECEIVER DEVI: 1.0000 | Freq: Once | 0 refills | Status: AC

## 2023-05-19 MED ORDER — OMNIPOD 5 DEXG7G6 PODS GEN 5 MISC
1.0000 | 3 refills | Status: DC
Start: 2023-05-19 — End: 2023-12-02

## 2023-05-19 MED ORDER — DEXCOM G7 SENSOR MISC: 3.0000 | 3 refills | Status: DC
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
## 2023-05-19 NOTE — Patient Instructions (Addendum)
Please use the following pump settings: - basal rates: 12 am: 1.35 3 am:  1.3 5 am: 1.3 9 am: 1.2 1 pm: 1.3 6 pm: 1.4 - ICR:  12 am: 4  5 pm: 4 - target: 110-140 >> 110-110 - ISF: 50 - Active insulin time: 4 hours  Try to enter ALL carbs at the beginning of the meal. Try to enter at least 20g carbs for coffee if you add the creamer. Try to use a temporary basal rate of 80% when exercising. Please always check blood sugars with your glucometer when correcting a very high or a low sugar.  Try to start Dexcom G7 and integrate it with the Omnipod 5.  PLEASE SCHEDULE A NEW EYE EXAM!   Please continue levothyroxine 225 mcg daily.   Take the thyroid hormone every day, with water, at least 30 minutes before breakfast, separated by at least 4 hours from: - acid reflux medications - calcium - iron - multivitamins   Please return in 4 months.

## 2023-05-25 ENCOUNTER — Other Ambulatory Visit: Payer: Self-pay | Admitting: Internal Medicine

## 2023-05-29 ENCOUNTER — Telehealth: Payer: Self-pay | Admitting: Dietician

## 2023-05-29 NOTE — Telephone Encounter (Signed)
Returned patient's wife's call.  Patient was present. Patient has run out of the [place] 3 sensors and his wife has helped him start the Dexcom G7 sensors.  He needs help integrating this with the Omnipod 5 so that it can run in Auto mode.  Discussed with them that the pods have to say Dexcom G7 on the box to be compatible.  He states that he has an order to pick up at the pharmacy.  Appointment made with Bonita Quin for this on 9/10.  Oran Rein, RD, LDN, CDCES

## 2023-05-29 NOTE — Telephone Encounter (Signed)
[facility]  They state that currently the G7 compatible pods are not at the local pharmacies and must be obtained from one of the pharmacies below:  [facility] [phone]  [facility] [phone] OR [phone]  [facility] [phone]  Called the patient's wife about the following and sent patient my chart message.  Oran Rein, RD, LDN, CDCES

## 2023-06-05 ENCOUNTER — Encounter: Payer: Self-pay | Admitting: Internal Medicine

## 2023-06-09 ENCOUNTER — Encounter: Payer: BC Managed Care – PPO | Attending: Internal Medicine | Admitting: Nutrition

## 2023-06-09 DIAGNOSIS — E119 Type 2 diabetes mellitus without complications: Secondary | ICD-10-CM | POA: Insufficient documentation

## 2023-06-09 DIAGNOSIS — Z713 Dietary counseling and surveillance: Secondary | ICD-10-CM | POA: Diagnosis not present

## 2023-06-09 DIAGNOSIS — E1065 Type 1 diabetes mellitus with hyperglycemia: Secondary | ICD-10-CM

## 2023-06-09 NOTE — Progress Notes (Unsigned)
Patient's pdm was not upgraded to use the G7 sensors.  He was given the help line at omnipod to call for directions on how to do this.  He also does not have the pods as yet that are capable of using the G7 sensors.  He reports having called [employer], but not getting any answer.  He was given our number to use to call [employer] to see if he can get through using this line.  He agreed to call them when he leaves here.  He had no final questions.He was shown where to go on the PDM once it is upgraded, and how to change the settings to use the G7 sensors.  He had no final questions.

## 2023-06-09 NOTE — Patient Instructions (Signed)
Call OmniPod help line to get help with upgrading your PDM Call [employer] to see when your pods will arrive.

## 2023-08-20 ENCOUNTER — Encounter: Payer: Self-pay | Admitting: Internal Medicine

## 2023-08-20 ENCOUNTER — Ambulatory Visit: Payer: BC Managed Care – PPO | Admitting: Internal Medicine

## 2023-08-20 VITALS — BP 130/70 | HR 74 | Ht 75.0 in | Wt 255.2 lb

## 2023-08-20 DIAGNOSIS — E890 Postprocedural hypothyroidism: Secondary | ICD-10-CM

## 2023-08-20 DIAGNOSIS — E1065 Type 1 diabetes mellitus with hyperglycemia: Secondary | ICD-10-CM | POA: Diagnosis not present

## 2023-08-20 DIAGNOSIS — E785 Hyperlipidemia, unspecified: Secondary | ICD-10-CM | POA: Diagnosis not present

## 2023-08-20 DIAGNOSIS — E1165 Type 2 diabetes mellitus with hyperglycemia: Secondary | ICD-10-CM

## 2023-08-20 LAB — POCT GLYCOSYLATED HEMOGLOBIN (HGB A1C): Hemoglobin A1C: 7 % — AB (ref 4.0–5.6)

## 2023-08-20 MED ORDER — SEMAGLUTIDE(0.25 OR 0.5MG/DOS) 2 MG/3ML ~~LOC~~ SOPN: 0.5000 mg | PEN_INJECTOR | SUBCUTANEOUS | 3 refills | Status: DC

## 2023-08-20 MED ORDER — INSULIN ASPART 100 UNIT/ML IJ SOLN
80.0000 [IU] | Freq: Every day | INTRAMUSCULAR | 1 refills | Status: DC
Start: 2023-08-20 — End: 2023-12-16

## 2023-08-20 NOTE — Patient Instructions (Addendum)
Please use the following pump settings: - basal rates: 12 am: 1.35 >> 1:4 3 am:  1.3 >> 1:4 5 am: 1.3 9 am: 1.2 1 pm: 1.3 6 pm: 1.3 - ICR:  12 am: 4  5 pm: 4 - target: 110-140 >> 110-110 - ISF: 50 - Active insulin time: 4 hours  Try to enter ALL carbs at the beginning of the meal. Try to enter at least 20g carbs for coffee if you add the creamer. Try to use a temporary basal rate of 80% when exercising. Please always check blood sugars with your glucometer when correcting a very high or a low sugar.  Try to start: - Please start Ozempic 0.25 mg weekly in a.m. (for example on Sunday morning) x 2 weeks, then increase to 0.5 mg weekly in a.m. if no nausea or hypoglycemia.  Try to start the auto mode.  PLEASE SCHEDULE A NEW EYE EXAM!   Please continue levothyroxine 225 mcg daily.   Take the thyroid hormone every day, with water, at least 30 minutes before breakfast, separated by at least 4 hours from: - acid reflux medications - calcium - iron - multivitamins   Please return in 4 months.
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
## 2023-08-21 ENCOUNTER — Telehealth: Payer: Self-pay

## 2023-08-21 ENCOUNTER — Other Ambulatory Visit (HOSPITAL_COMMUNITY): Payer: Self-pay

## 2023-08-21 NOTE — Addendum Note (Signed)
Addended by: Pollie Meyer on: 08/21/2023 08:27 AM   Modules accepted: Orders

## 2023-08-21 NOTE — Telephone Encounter (Signed)
Pharmacy Patient Advocate Encounter   Received notification from CoverMyMeds that prior authorization for Ozempic is required/requested.   Insurance verification completed.   The patient is insured through [employer] [place] [employer]  .   Per test claim: CANCELLED due to

## 2023-08-25 ENCOUNTER — Other Ambulatory Visit (HOSPITAL_COMMUNITY): Payer: Self-pay

## 2023-09-04 NOTE — Telephone Encounter (Signed)
I called and spoke with the patient he states that he believes his insurance covered it. The pharmacy has told him it was ready but he is not going to pick it up. He read about this medication and declines to take it due to all of the side effects. FYI.

## 2023-11-30 ENCOUNTER — Other Ambulatory Visit: Payer: Self-pay | Admitting: Internal Medicine

## 2023-12-02 ENCOUNTER — Telehealth: Payer: Self-pay

## 2023-12-02 ENCOUNTER — Telehealth: Payer: Self-pay | Admitting: Internal Medicine

## 2023-12-02 ENCOUNTER — Other Ambulatory Visit (HOSPITAL_COMMUNITY): Payer: Self-pay

## 2023-12-02 DIAGNOSIS — E1065 Type 1 diabetes mellitus with hyperglycemia: Secondary | ICD-10-CM

## 2023-12-02 MED ORDER — OMNIPOD 5 DEXG7G6 PODS GEN 5 MISC
1.0000 | 3 refills | Status: DC
Start: 2023-12-02 — End: 2024-05-27

## 2023-12-02 NOTE — Telephone Encounter (Signed)
 Pt needs a PA for OMNIPODS

## 2023-12-02 NOTE — Telephone Encounter (Signed)
 MEDICATION: Omnipod 5 DexG7G6 Pods Gen 5   PHARMACY:    [facility] (New Address) - [place], [place] - [address] AT Previously: [address] (Ph: [phone])    HAS THE PATIENT CONTACTED THEIR PHARMACY?  Yes  IS THIS A 90 DAY SUPPLY : Yes  IS PATIENT OUT OF MEDICATION: Almost  IF NOT; HOW MUCH IS LEFT: 2 pods left  LAST APPOINTMENT DATE: @8 /20/2024  NEXT APPOINTMENT DATE:@3 /21/2025  DO WE HAVE YOUR PERMISSION TO LEAVE A DETAILED MESSAGE?:Yes  OTHER COMMENTS: Patient's wife states that she made this request 2 weeks ago.   **Let patient know to contact pharmacy at the end of the day to make sure medication is ready. **  ** Please notify patient to allow 48-72 hours to process**  **Encourage patient to contact the pharmacy for refills or they can request refills through [facility]**

## 2023-12-02 NOTE — Telephone Encounter (Signed)
 Medication refill request complete

## 2023-12-02 NOTE — Telephone Encounter (Signed)
 Pharmacy Patient Advocate Encounter   Received notification from Pt Calls Messages that prior authorization for Omnipod is required/requested.   Insurance verification completed.   The patient is insured through [facility]  .   Per test claim: PA required; PA submitted to above mentioned insurance via CoverMyMeds Key/confirmation #/EOC [id number] Status is pending

## 2023-12-04 ENCOUNTER — Other Ambulatory Visit: Payer: Self-pay | Admitting: Internal Medicine

## 2023-12-04 ENCOUNTER — Telehealth: Payer: Self-pay

## 2023-12-04 DIAGNOSIS — E1065 Type 1 diabetes mellitus with hyperglycemia: Secondary | ICD-10-CM

## 2023-12-04 MED ORDER — NOVOLOG FLEXPEN 100 UNIT/ML ~~LOC~~ SOPN: PEN_INJECTOR | SUBCUTANEOUS | 3 refills | Status: AC

## 2023-12-04 MED ORDER — INSULIN GLARGINE-YFGN 100 UNIT/ML ~~LOC~~ SOPN: 30.0000 [IU] | PEN_INJECTOR | Freq: Every day | SUBCUTANEOUS | 3 refills | Status: AC

## 2023-12-04 NOTE — Telephone Encounter (Signed)
 Pt called asking if we could send his pens. His pods are not arriving until Monday he thinks to his home

## 2023-12-04 NOTE — Telephone Encounter (Signed)
 J, I sent Semglee to start at 30 units at night and may need to adjust this up. I also sent NovoLog pens.  He can use the same insulin to carb ratio, target and sensitivity factor as in the pump. Ty! C

## 2023-12-04 NOTE — Telephone Encounter (Signed)
 Pharmacy Patient Advocate Encounter  Received notification from [facility]  that Prior Authorization for Omnipod 5 Dex G7G6 Pods Gen 5 has been APPROVED from 11-02-2023 to 12-01-2024   PA #/Case ID/Reference #: [id number]

## 2023-12-16 ENCOUNTER — Ambulatory Visit: Admitting: Internal Medicine

## 2023-12-16 ENCOUNTER — Encounter: Payer: Self-pay | Admitting: Internal Medicine

## 2023-12-16 VITALS — BP 118/70 | HR 67 | Ht 75.0 in | Wt 245.4 lb

## 2023-12-16 DIAGNOSIS — E1065 Type 1 diabetes mellitus with hyperglycemia: Secondary | ICD-10-CM

## 2023-12-16 DIAGNOSIS — E890 Postprocedural hypothyroidism: Secondary | ICD-10-CM

## 2023-12-16 DIAGNOSIS — E785 Hyperlipidemia, unspecified: Secondary | ICD-10-CM | POA: Diagnosis not present

## 2023-12-16 DIAGNOSIS — Z794 Long term (current) use of insulin: Secondary | ICD-10-CM | POA: Diagnosis not present

## 2023-12-16 LAB — POCT GLYCOSYLATED HEMOGLOBIN (HGB A1C): Hemoglobin A1C: 7.2 % — AB (ref 4.0–5.6)

## 2023-12-16 NOTE — Addendum Note (Signed)
 Addended by: Pollie Meyer on: 12/16/2023 11:35 AM   Modules accepted: Orders
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
## 2023-12-16 NOTE — Patient Instructions (Addendum)
 Please use the following pump settings: - basal rates: 12 am: 1.35 3 am:  1.3 5 am: 1.3 9 am: 1.2 1 pm: 1.3 6 pm: 1.3 - ICR:  12 am: 4 (change to 5 if sugars continue to improve after Ozempic gets in your system) - target: 110-140 >> 110 - ISF: 50 - Active insulin time: 4 hours  Try to enter ALL carbs at the beginning of the meal. Try to enter only 10g carbs for coffee if you add the creamer. Try to use a temporary basal rate of 80% when exercising. Please always check blood sugars with your glucometer when correcting a very high or a low sugar.  Also continue:  - Ozempic 0.5 mg weekly in a.m.   Try to start the auto mode.   Please continue levothyroxine 225 mcg daily.   Take the thyroid hormone every day, with water, at least 30 minutes before breakfast, separated by at least 4 hours from: - acid reflux medications - calcium - iron - multivitamins   Please return in 4 months.

## 2023-12-18 ENCOUNTER — Ambulatory Visit: Payer: BC Managed Care – PPO | Admitting: Internal Medicine

## 2024-01-18 ENCOUNTER — Encounter: Payer: Self-pay | Admitting: Family Medicine

## 2024-01-18 ENCOUNTER — Ambulatory Visit: Payer: BC Managed Care – PPO | Admitting: Family Medicine

## 2024-01-18 VITALS — BP 118/66 | HR 68 | Temp 98.2°F | Ht 74.0 in | Wt 230.5 lb

## 2024-01-18 DIAGNOSIS — L819 Disorder of pigmentation, unspecified: Secondary | ICD-10-CM | POA: Diagnosis not present

## 2024-01-18 DIAGNOSIS — E785 Hyperlipidemia, unspecified: Secondary | ICD-10-CM | POA: Diagnosis not present

## 2024-01-18 DIAGNOSIS — E890 Postprocedural hypothyroidism: Secondary | ICD-10-CM | POA: Diagnosis not present

## 2024-01-18 DIAGNOSIS — I10 Essential (primary) hypertension: Secondary | ICD-10-CM

## 2024-01-18 DIAGNOSIS — Z1159 Encounter for screening for other viral diseases: Secondary | ICD-10-CM | POA: Diagnosis not present

## 2024-01-18 DIAGNOSIS — Z1211 Encounter for screening for malignant neoplasm of colon: Secondary | ICD-10-CM

## 2024-01-18 DIAGNOSIS — E1065 Type 1 diabetes mellitus with hyperglycemia: Secondary | ICD-10-CM

## 2024-01-18 DIAGNOSIS — Z23 Encounter for immunization: Secondary | ICD-10-CM

## 2024-01-18 DIAGNOSIS — F17200 Nicotine dependence, unspecified, uncomplicated: Secondary | ICD-10-CM

## 2024-01-18 LAB — LIPID PANEL
Cholesterol: 110 mg/dL (ref 0–200)
HDL: 39.5 mg/dL (ref >39.00)
LDL Cholesterol: 54 mg/dL (ref 0–99)
NonHDL: 70.43
Total CHOL/HDL Ratio: 3
Triglycerides: 81 mg/dL (ref 0.0–149.0)
VLDL: 16.2 mg/dL (ref 0.0–40.0)

## 2024-01-18 LAB — COMPREHENSIVE METABOLIC PANEL WITH GFR
ALT: 12 U/L (ref 0–53)
AST: 18 U/L (ref 0–37)
Albumin: 4.6 g/dL (ref 3.5–5.2)
Alkaline Phosphatase: 75 U/L (ref 39–117)
BUN: 15 mg/dL (ref 6–23)
CO2: 29 meq/L (ref 19–32)
Calcium: 8.9 mg/dL (ref 8.4–10.5)
Chloride: 99 meq/L (ref 96–112)
Creatinine, Ser: 0.91 mg/dL (ref 0.40–1.50)
GFR: 94.52 mL/min (ref >60.00)
Glucose, Bld: 294 mg/dL — ABNORMAL HIGH (ref 70–99)
Potassium: 4.5 meq/L (ref 3.5–5.1)
Sodium: 133 meq/L — ABNORMAL LOW (ref 135–145)
Total Bilirubin: 0.8 mg/dL (ref 0.2–1.2)
Total Protein: 7 g/dL (ref 6.0–8.3)

## 2024-01-18 NOTE — Assessment & Plan Note (Signed)
 Declines colonoscopy first line-wants to do cologuard first  This was ordered  Pt understands that positive test would necessitate colonoscopy and could mean polyps or cancer

## 2024-01-18 NOTE — Patient Instructions (Addendum)
 Tetanus shot today  (Tdap)     Hepatitis C screening with labs Labs for chem levels and cholesterol   Read about the coronary calcium  scan and let us  know if you are interested  Let us  know if you want to get that -it costs average 200$ out of pocket   I ordered the cologuard -if you don't hear in a week let us  know   I put the referral in for eye doctor and dermatologist  Please let us  know if you don't hear in 1-2 weeks to set that up let us  know

## 2024-01-18 NOTE — Assessment & Plan Note (Signed)
 With DM1 bp in fair control at this time  BP Readings from Last 1 Encounters:  01/18/24 118/66   No changes needed Continues losartan  25 mg daily Most recent labs reviewed  Disc lifstyle change with low sodium diet and exercise

## 2024-01-18 NOTE — Assessment & Plan Note (Signed)
 Losing weight with ozempic  0.5 mg and pleased  Encouraged good food choices and exercise

## 2024-01-18 NOTE — Assessment & Plan Note (Signed)
 Areas of hypopigmentation on arms and hands resembling vitiligo  Referral to dermatology done

## 2024-01-18 NOTE — Assessment & Plan Note (Signed)
Hep C screen today 

## 2024-01-18 NOTE — Assessment & Plan Note (Signed)
 History of Graves with ablation   Lab Results  Component Value Date   TSH 1.29 01/12/2023   Sees endocrinology  Takes levothyroxine  225 mcg daily  No clinical changes  Weight loss is from GLP-1

## 2024-01-18 NOTE — Assessment & Plan Note (Signed)
 Smokes less than 1/2 pp week  Not interested in quitting  Some cigs and some e cigs Declines pna vaccine

## 2024-01-18 NOTE — Assessment & Plan Note (Signed)
 Under care of endocriology  Last A1c 7.2   Insulin  pump Semaglutide  0.5 mg weekly with some weight loss  Encouraged strongly to work on muscle building exercise

## 2024-01-18 NOTE — Assessment & Plan Note (Signed)
 Disc goals for lipids and reasons to control them Rev last labs with pt Rev low sat fat diet in detail Takes atorvastatin  20 mg daily   Lab today  Diet is sub optimal

## 2024-01-18 NOTE — Progress Notes (Signed)
 Subjective:    Patient ID: Willie Spencer, male    DOB: 10/21/1965, 58 y.o.   MRN: [id number]  HPI  Wt Readings from Last 3 Encounters:  01/18/24 230 lb 8 oz (104.6 kg)  12/16/23 245 lb 6.4 oz (111.3 kg)  08/20/23 255 lb 3.2 oz (115.8 kg)   29.59 kg/m  Vitals:   01/18/24 0832  BP: 118/66  Pulse: 68  Temp: 98.2 F (36.8 C)  SpO2: 99%   Pt presents to establish for primary care  Last pcp over 5 years ago   Interested in dermatology visit  Pale spots on hands and arms    Is due for eye exam  Has DM1 and sees endocrinology -Dr Aldona Amel  Also post ablative hypothyroidism (past Graves)  Recent A1c up to 7.2 Insulin   Semaglutide - on the 0.5 dose - really curbing appetite  Glucose is better controlled and has lost  Happy with the weight loss   Losartan  for HTN bp is stable today  No cp or palpitations or headaches or edema  No side effects to medicines  BP Readings from Last 3 Encounters:  01/18/24 118/66  12/16/23 118/70  08/20/23 130/70     Utd microalb with endo   Lab Results  Component Value Date   TSH 1.29 01/12/2023   Takes levothyroxine  225 mcg daily     Hyperlipidemia Lab Results  Component Value Date   CHOL 145 01/12/2023   HDL 39.70 01/12/2023   LDLCALC 66 01/12/2023   LDLDIRECT 106.0 10/22/2017   TRIG 195.0 (H) 01/12/2023   CHOLHDL 4 01/12/2023   Atorvastatin  20 mg daily  Pretty good eater for carbs but too many fried foods and red meat     Former smoker  Still smokes once in a while  Both cig and e - cigs  Will smoke on Saturday nights now    Imms Tetanus -wants to get  Pneumonia -declines   Colon cancer screening       Patient Active Problem List   Diagnosis Date Noted   Colon cancer screening 01/18/2024   Encounter for hepatitis C screening test for low risk patient 01/18/2024   Hypopigmentation 01/18/2024   S/P VATS with drainage of fibrothorax due to hemothorax with decortication 05/24/2021   Hyperlipidemia  10/22/2017   HTN (hypertension) 01/29/2016   Obesity 10/24/2015   Chronic pain in right shoulder 07/26/2015   Type 1 diabetes mellitus with hyperglycemia (HCC) 07/09/2015   Postablative hypothyroidism 07/09/2015   Smoker 07/09/2015   Past Medical History:  Diagnosis Date   Diabetes mellitus without complication (HCC)    Dyspnea    GERD (gastroesophageal reflux disease)    Hypertension    Hypothyroidism    Sleep apnea    Thyroid  disease    Past Surgical History:  Procedure Laterality Date   IR THORACENTESIS ASP PLEURAL SPACE W/IMG GUIDE  05/14/2021   VIDEO ASSISTED THORACOSCOPY (VATS)/DECORTICATION Right 05/24/2021   Procedure: VIDEO ASSISTED THORACOSCOPY (VATS)/DRAINAGE OF HEMOTHORAX, DECORTICATION;  Surgeon: Zelphia Higashi, MD;  Location: [facility] OR;  Service: Thoracic;  Laterality: Right;   Social History   Tobacco Use   Smoking status: Some Days    Current packs/day: 0.50    Types: Cigarettes, E-cigarettes   Smokeless tobacco: Never   Tobacco comments:    uses ecigs and cigs -- less than 1/2 pack per week  Vaping Use   Vaping status: Former  Substance Use Topics   Alcohol use: Yes    Alcohol/week: 0.0 standard  drinks of alcohol    Comment: socially, occasionally heavier on weekends   Drug use: No   Family History  Problem Relation Age of Onset   Diabetes Paternal Grandfather    Stroke Paternal Grandfather    Heart disease Paternal Grandfather    Allergies  Allergen Reactions   Wellbutrin [Bupropion] Swelling and Other (See Comments)    Gum swelling   Latex Rash   Levemir [Insulin  Detemir] Rash   Current Outpatient Medications on File Prior to Visit  Medication Sig Dispense Refill   atorvastatin  (LIPITOR) 20 MG tablet TAKE 1 TABLET BY MOUTH EVERY DAY 90 tablet 3   Continuous Glucose Sensor (DEXCOM G7 SENSOR) MISC 3 each by Does not apply route every 30 (thirty) days. Apply 1 sensor every 10 days 9 each 3   FIASP  100 UNIT/ML SOLN USE UP TO 100 UNITS A DAY IN  THE INSULIN  PUMP 90 mL 1   FREESTYLE LITE test strip USE AS INSTRUCTED TO TEST BLOOD SUGAR UP TO 4 TIMES DAILY. 50 strip 5   Glucagon  3 MG/DOSE POWD Place 3 mg into the nose once as needed for up to 1 dose. 1 each 11   ibuprofen (ADVIL) 200 MG tablet Take 400-600 mg by mouth every 6 (six) hours as needed for moderate pain.     insulin  aspart (NOVOLOG  FLEXPEN) 100 UNIT/ML FlexPen Inject under skin before meals, up to 60 units daily as advised. 15 mL 3   Insulin  Disposable Pump (OMNIPOD 5 DEXG7G6 PODS GEN 5) MISC 1 each by Does not apply route every other day. 45 each 3   insulin  glargine-yfgn (SEMGLEE ) 100 UNIT/ML Pen Inject 30-35 Units into the skin daily. 15 mL 3   Insulin  Pen Needle 32G X 6 MM MISC To use with insulin  pen. 100 each 1   levothyroxine  (SYNTHROID ) 200 MCG tablet TAKE 1 TABLET (200 MCG TOTAL) BY MOUTH DAILY BEFORE BREAKFAST. ALONG WITH 25 MCG DAILY. 90 tablet 3   levothyroxine  (SYNTHROID ) 25 MCG tablet TAKE 1 TABLET (25 MCG TOTAL) BY MOUTH DAILY. ALONG WITH 200 MCG DAILY. 90 tablet 3   losartan  (COZAAR ) 25 MG tablet Take 25 mg by mouth daily.     Multiple Vitamin (MULTIVITAMIN WITH MINERALS) TABS tablet Take 1 tablet by mouth daily.     naproxen sodium (ALEVE) 220 MG tablet Take 440 mg by mouth 2 (two) times daily as needed (pain).     Semaglutide ,0.25 or 0.5MG /DOS, 2 MG/3ML SOPN Inject 0.5 mg into the skin once a week. 9 mL 3   Sennosides (EX-LAX PO) Take 1 tablet by mouth daily as needed (constipation).     No current facility-administered medications on file prior to visit.    Review of Systems  Constitutional:  Positive for appetite change. Negative for activity change, fatigue, fever and unexpected weight change.  HENT:  Negative for congestion, rhinorrhea, sore throat and trouble swallowing.   Eyes:  Negative for pain, redness, itching and visual disturbance.  Respiratory:  Negative for cough, chest tightness, shortness of breath and wheezing.   Cardiovascular:  Negative  for chest pain and palpitations.  Gastrointestinal:  Negative for abdominal pain, blood in stool, constipation, diarrhea and nausea.  Endocrine: Negative for cold intolerance, heat intolerance, polydipsia and polyuria.  Genitourinary:  Negative for difficulty urinating, dysuria, frequency and urgency.  Musculoskeletal:  Negative for arthralgias, joint swelling and myalgias.  Skin:  Negative for pallor and rash.       Loss of pigment in hands/arms   Neurological:  Negative for dizziness, tremors, weakness, numbness and headaches.  Hematological:  Negative for adenopathy. Does not bruise/bleed easily.  Psychiatric/Behavioral:  Negative for decreased concentration and dysphoric mood. The patient is not nervous/anxious.        Objective:   Physical Exam Constitutional:      General: He is not in acute distress.    Appearance: Normal appearance. He is well-developed. He is not ill-appearing or diaphoretic.     Comments: Overweight   HENT:     Head: Normocephalic and atraumatic.     Mouth/Throat:     Mouth: Mucous membranes are moist.  Eyes:     General:        Right eye: No discharge.        Left eye: No discharge.     Conjunctiva/sclera: Conjunctivae normal.     Pupils: Pupils are equal, round, and reactive to light.  Neck:     Thyroid : No thyromegaly.     Vascular: No carotid bruit or JVD.  Cardiovascular:     Rate and Rhythm: Normal rate and regular rhythm.     Heart sounds: Normal heart sounds.     No gallop.  Pulmonary:     Effort: Pulmonary effort is normal. No respiratory distress.     Breath sounds: Normal breath sounds. No stridor. No wheezing, rhonchi or rales.     Comments: Bs are mildly distant  Abdominal:     General: There is no distension or abdominal bruit.     Palpations: Abdomen is soft.  Musculoskeletal:     Cervical back: Normal range of motion and neck supple.     Right lower leg: No edema.     Left lower leg: No edema.  Lymphadenopathy:     Cervical:  No cervical adenopathy.  Skin:    General: Skin is warm and dry.     Coloration: Skin is not pale.     Findings: No rash.     Comments: Patchy loss of pigmentation on arms and hands  Dry skin   Solar lentigines diffusely   Neurological:     Mental Status: He is alert.     Coordination: Coordination normal.     Deep Tendon Reflexes: Reflexes are normal and symmetric.  Psychiatric:        Mood and Affect: Mood normal.           Assessment & Plan:   Problem List Items Addressed This Visit       Cardiovascular and Mediastinum   HTN (hypertension)   With DM1 bp in fair control at this time  BP Readings from Last 1 Encounters:  01/18/24 118/66   No changes needed Continues losartan  25 mg daily Most recent labs reviewed  Disc lifstyle change with low sodium diet and exercise        Relevant Medications   losartan  (COZAAR ) 25 MG tablet     Endocrine   Type 1 diabetes mellitus with hyperglycemia (HCC) (Chronic)   Under care of endocriology  Last A1c 7.2   Insulin  pump Semaglutide  0.5 mg weekly with some weight loss  Encouraged strongly to work on muscle building exercise       Relevant Medications   losartan  (COZAAR ) 25 MG tablet   Other Relevant Orders   Ambulatory referral to Ophthalmology   Postablative hypothyroidism (Chronic)   History of Graves with ablation   Lab Results  Component Value Date   TSH 1.29 01/12/2023   Sees endocrinology  Takes levothyroxine  225 mcg  daily  No clinical changes  Weight loss is from GLP-1          Other   Hyperlipidemia (Chronic)   Disc goals for lipids and reasons to control them Rev last labs with pt Rev low sat fat diet in detail Takes atorvastatin  20 mg daily   Lab today  Diet is sub optimal      Relevant Medications   losartan  (COZAAR ) 25 MG tablet   Other Relevant Orders   Lipid panel   Comprehensive metabolic panel with GFR   Smoker   Smokes less than 1/2 pp week  Not interested in quitting   Some cigs and some e cigs Declines pna vaccine         Hypopigmentation - Primary   Areas of hypopigmentation on arms and hands resembling vitiligo  Referral to dermatology done       Relevant Orders   Ambulatory referral to Dermatology   Encounter for hepatitis C screening test for low risk patient   Hep C screen today      Relevant Orders   Hepatitis C Antibody   Colon cancer screening   Declines colonoscopy first line-wants to do cologuard first  This was ordered  Pt understands that positive test would necessitate colonoscopy and could mean polyps or cancer       Relevant Orders   Cologuard   Other Visit Diagnoses       Need for Tdap vaccination       Relevant Orders   Tdap vaccine greater than or equal to 7yo IM (Completed)

## 2024-01-19 LAB — HEPATITIS C ANTIBODY: Hepatitis C Ab: NONREACTIVE

## 2024-01-20 ENCOUNTER — Encounter: Payer: Self-pay | Admitting: *Deleted

## 2024-04-18 ENCOUNTER — Ambulatory Visit: Admitting: Internal Medicine
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
## 2024-05-20 ENCOUNTER — Ambulatory Visit: Admitting: Internal Medicine
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
## 2024-05-27 ENCOUNTER — Other Ambulatory Visit: Payer: Self-pay | Admitting: Internal Medicine

## 2024-05-27 DIAGNOSIS — E1065 Type 1 diabetes mellitus with hyperglycemia: Secondary | ICD-10-CM

## 2024-05-29 ENCOUNTER — Other Ambulatory Visit: Payer: Self-pay | Admitting: Internal Medicine

## 2024-05-30 ENCOUNTER — Other Ambulatory Visit: Payer: Self-pay | Admitting: Internal Medicine

## 2024-06-01 ENCOUNTER — Other Ambulatory Visit: Payer: Self-pay | Admitting: Internal Medicine

## 2024-06-02 ENCOUNTER — Other Ambulatory Visit: Payer: Self-pay | Admitting: Internal Medicine

## 2024-06-02 DIAGNOSIS — E1065 Type 1 diabetes mellitus with hyperglycemia: Secondary | ICD-10-CM

## 2024-06-08 ENCOUNTER — Encounter: Payer: Self-pay | Admitting: Internal Medicine

## 2024-06-08 ENCOUNTER — Other Ambulatory Visit: Payer: Self-pay

## 2024-06-08 ENCOUNTER — Ambulatory Visit: Admitting: Internal Medicine

## 2024-06-08 VITALS — BP 130/70 | HR 70 | Ht 74.0 in | Wt 222.0 lb

## 2024-06-08 DIAGNOSIS — E1065 Type 1 diabetes mellitus with hyperglycemia: Secondary | ICD-10-CM

## 2024-06-08 DIAGNOSIS — E890 Postprocedural hypothyroidism: Secondary | ICD-10-CM

## 2024-06-08 DIAGNOSIS — E785 Hyperlipidemia, unspecified: Secondary | ICD-10-CM

## 2024-06-08 LAB — POCT GLYCOSYLATED HEMOGLOBIN (HGB A1C): Hemoglobin A1C: 6.6 % — AB (ref 4.0–5.6)

## 2024-06-08 MED ORDER — OMNIPOD 5 DEXG7G6 PODS GEN 5 MISC
3 refills | Status: AC
Start: 2024-06-08 — End: ?

## 2024-06-08 NOTE — Patient Instructions (Addendum)
 Please use the following pump settings: - basal rates: 12 am: 1.35 3 am:  1.3 5 am: 1.3 9 am: 1.2 1 pm: 1.3 6 pm: 1.3 - ICR:  12 am: 4  - target: 110-110 - ISF: 50 - Active insulin  time: 4 hours  Try to enter ALL carbs at the beginning of the meal. Try to enter only 10g carbs for coffee if you add the creamer. Try to use a temporary basal rate of 80% when exercising. Please always check blood sugars with your glucometer when correcting a very high or a low sugar.  SWITCH TO AUTO MODE.  Do not overcorrect low blood sugars.  Also continue:  - Ozempic  0.5 mg weekly in a.m.   YOU NEED AN EYE EXAM!   Please continue levothyroxine  225 mcg daily.   Take the thyroid  hormone every day, with water, at least 30 minutes before breakfast, separated by at least 4 hours from: - acid reflux medications - calcium  - iron - multivitamins   Please stop at the lab.  Please return in 4 months.
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
## 2024-06-09 ENCOUNTER — Ambulatory Visit: Payer: Self-pay | Admitting: Internal Medicine

## 2024-06-09 LAB — TSH: TSH: 0.24 m[IU]/L — ABNORMAL LOW (ref 0.40–4.50)

## 2024-06-09 LAB — MICROALBUMIN / CREATININE URINE RATIO
Creatinine, Urine: 45 mg/dL (ref 20–320)
Microalb, Ur: <0.2 mg/dL

## 2024-06-09 LAB — T4, FREE: Free T4: 1.9 ng/dL — ABNORMAL HIGH (ref 0.8–1.8)

## 2024-06-09 NOTE — Addendum Note (Signed)
 Addended by: TRIXIE FILE on: 06/09/2024 04:20 PM   Modules accepted: Orders

## 2024-06-09 NOTE — Addendum Note (Signed)
 Addended by: CLEOTILDE ROLIN RAMAN on: 06/09/2024 01:25 PM   Modules accepted: Orders

## 2024-07-29 ENCOUNTER — Encounter: Payer: Self-pay | Admitting: Internal Medicine

## 2024-08-02 NOTE — Telephone Encounter (Signed)
Placed on providers desk

## 2024-08-23 MED ORDER — DEXCOM G7 SENSOR MISC: 3 refills | Status: AC

## 2024-08-23 NOTE — Addendum Note (Signed)
 Addended by: CLEOTILDE ROLIN RAMAN on: 08/23/2024 01:42 PM   Modules accepted: Orders

## 2024-08-27 ENCOUNTER — Other Ambulatory Visit: Payer: Self-pay | Admitting: Internal Medicine

## 2024-08-28 ENCOUNTER — Other Ambulatory Visit: Payer: Self-pay | Admitting: Internal Medicine

## 2024-08-29 ENCOUNTER — Other Ambulatory Visit (HOSPITAL_COMMUNITY): Payer: Self-pay

## 2024-08-29 ENCOUNTER — Telehealth: Payer: Self-pay

## 2024-08-29 NOTE — Telephone Encounter (Signed)
 Pt needs PA for Dexcom G7

## 2024-08-29 NOTE — Telephone Encounter (Signed)
 Pharmacy Patient Advocate Encounter   Received notification from Pt Calls Messages that prior authorization for Dexcom G7 sensor is required/requested.   Insurance verification completed.   The patient is insured through [facility].   Per test claim: PA required; PA submitted to above mentioned insurance via Latent Key/confirmation #/EOC [id number] Status is pending

## 2024-09-15 MED ORDER — OMNIPOD 5 DEXG7G6 PODS GEN 5 MISC: 3 refills | Status: AC

## 2024-09-15 NOTE — Addendum Note (Signed)
 Addended by: CLEOTILDE ROLIN RAMAN on: 09/15/2024 04:43 PM   Modules accepted: Orders

## 2024-09-19 NOTE — Telephone Encounter (Signed)
 Pharmacy Patient Advocate Encounter  Received notification from [employer] that Prior Authorization for [facility] G7 SENSOR has been APPROVED from 08/29/2024 to 08/29/2025   PA #/Case ID/Reference #: [id number]

## 2024-09-20 ENCOUNTER — Other Ambulatory Visit: Payer: Self-pay | Admitting: Internal Medicine

## 2024-10-12 ENCOUNTER — Ambulatory Visit (INDEPENDENT_AMBULATORY_CARE_PROVIDER_SITE_OTHER): Admitting: Internal Medicine

## 2024-10-12 ENCOUNTER — Encounter: Payer: Self-pay | Admitting: Internal Medicine

## 2024-10-12 ENCOUNTER — Other Ambulatory Visit

## 2024-10-12 VITALS — BP 120/60 | HR 65 | Ht 74.0 in | Wt 232.8 lb

## 2024-10-12 DIAGNOSIS — E1065 Type 1 diabetes mellitus with hyperglycemia: Secondary | ICD-10-CM

## 2024-10-12 DIAGNOSIS — E890 Postprocedural hypothyroidism: Secondary | ICD-10-CM | POA: Diagnosis not present

## 2024-10-12 DIAGNOSIS — E785 Hyperlipidemia, unspecified: Secondary | ICD-10-CM

## 2024-10-12 LAB — POCT GLYCOSYLATED HEMOGLOBIN (HGB A1C): Hemoglobin A1C: 7.4 % — AB (ref 4.0–5.6)

## 2024-10-12 MED ORDER — GLUCAGON 3 MG/DOSE NA POWD: 3.0000 mg | Freq: Once | NASAL | 11 refills | Status: AC | PRN

## 2024-10-12 MED ORDER — OZEMPIC (0.25 OR 0.5 MG/DOSE) 2 MG/3ML ~~LOC~~ SOPN: 0.5000 mg | PEN_INJECTOR | SUBCUTANEOUS | 3 refills | Status: AC
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
## 2024-10-12 NOTE — Patient Instructions (Addendum)
 Please use the following pump settings: - basal rates: 12 am: 1.35 3 am:  1.3 5 am: 1.3 9 am: 1.2 1 pm: 1.3 6 pm: 1.3 Max basal rate: 1.35 >> 3.0 - ICR:  12 am: 4  - target: 110-140 >> 110-120 - ISF: 50 - Active insulin  time: 4 hours  Try to enter ALL carbs at the beginning of the meal. Try to enter only 10g carbs for coffee if you add the creamer. Try to use a temporary basal rate of 80% when exercising. Please always check blood sugars with your glucometer when correcting a very high or a low sugar.  SWITCH TO AUTO MODE.  Do not overcorrect low blood sugars.  Also continue:  - Ozempic  0.25 mg weekly in a.m., but increase to 0.5 when you are able   Please continue levothyroxine  200 mcg daily.   Take the thyroid  hormone every day, with water, at least 30 minutes before breakfast, separated by at least 4 hours from: - acid reflux medications - calcium  - iron - multivitamins   Please stop at the lab.  Please return in 4 months.

## 2024-10-13 LAB — T4, FREE: Free T4: 1.5 ng/dL (ref 0.8–1.8)

## 2024-10-13 LAB — TSH: TSH: 7.85 m[IU]/L — ABNORMAL HIGH (ref 0.40–4.50)

## 2024-10-13 NOTE — Addendum Note (Signed)
 Addended by: CLEOTILDE ROLIN RAMAN on: 10/13/2024 01:20 PM   Modules accepted: Orders

## 2024-10-28 ENCOUNTER — Telehealth: Payer: Self-pay | Admitting: Pharmacy Technician

## 2024-10-28 ENCOUNTER — Other Ambulatory Visit (HOSPITAL_COMMUNITY): Payer: Self-pay

## 2024-10-28 NOTE — Telephone Encounter (Signed)
 Pharmacy Patient Advocate Encounter  Received notification from [facility] that Prior Authorization for Ozempic  (0.25 or 0.5 MG/DOSE) 2MG /3ML pen-injectors  has been APPROVED from 10/28/24 to 10/28/25. Ran test claim, Copay is $30.00. This test claim was processed through [facility]- copay amounts may vary at other pharmacies due to pharmacy/plan contracts, or as the patient moves through the different stages of their insurance plan.   PA #/Case ID/Reference #: [id number]

## 2024-10-28 NOTE — Telephone Encounter (Signed)
 Pharmacy Patient Advocate Encounter   Received notification from [facility] that prior authorization for Ozempic  (0.25 or 0.5 MG/DOSE) 2MG /3ML pen-injectors  is required/requested.   Insurance verification completed.   The patient is insured through [facility] [employer] [facility].   Per test claim: PA required; PA submitted to above mentioned insurance via Latent Key/confirmation #/EOC [id number] Status is pending

## 2025-02-09 ENCOUNTER — Ambulatory Visit: Admitting: Internal Medicine
